# Patient Record
Sex: Male | Born: 1935 | Race: White | Hispanic: No | Smoking: Former smoker
Health system: Southern US, Community
[De-identification: ages and names within clinical notes are randomized; demographics above are authoritative.]

## PROBLEM LIST (undated history)

## (undated) DIAGNOSIS — I10 Essential (primary) hypertension: Secondary | ICD-10-CM

## (undated) DIAGNOSIS — I714 Abdominal aortic aneurysm, without rupture, unspecified: Secondary | ICD-10-CM

## (undated) DIAGNOSIS — D696 Thrombocytopenia, unspecified: Secondary | ICD-10-CM

## (undated) DIAGNOSIS — I4891 Unspecified atrial fibrillation: Secondary | ICD-10-CM

## (undated) DIAGNOSIS — M5136 Other intervertebral disc degeneration, lumbar region: Secondary | ICD-10-CM

## (undated) DIAGNOSIS — R609 Edema, unspecified: Secondary | ICD-10-CM

## (undated) DIAGNOSIS — Z8572 Personal history of non-Hodgkin lymphomas: Secondary | ICD-10-CM

## (undated) DIAGNOSIS — R739 Hyperglycemia, unspecified: Secondary | ICD-10-CM

## (undated) DIAGNOSIS — Z950 Presence of cardiac pacemaker: Secondary | ICD-10-CM

## (undated) DIAGNOSIS — R011 Cardiac murmur, unspecified: Secondary | ICD-10-CM

## (undated) DIAGNOSIS — D649 Anemia, unspecified: Secondary | ICD-10-CM

## (undated) DIAGNOSIS — Y95 Nosocomial condition: Secondary | ICD-10-CM

## (undated) DIAGNOSIS — Z9889 Other specified postprocedural states: Secondary | ICD-10-CM

## (undated) DIAGNOSIS — N3281 Overactive bladder: Secondary | ICD-10-CM

## (undated) DIAGNOSIS — E785 Hyperlipidemia, unspecified: Secondary | ICD-10-CM

## (undated) DIAGNOSIS — IMO0002 Reserved for concepts with insufficient information to code with codable children: Secondary | ICD-10-CM

## (undated) DIAGNOSIS — F329 Major depressive disorder, single episode, unspecified: Secondary | ICD-10-CM

## (undated) DIAGNOSIS — F32A Depression, unspecified: Secondary | ICD-10-CM

## (undated) DIAGNOSIS — H9190 Unspecified hearing loss, unspecified ear: Secondary | ICD-10-CM

## (undated) DIAGNOSIS — Z9289 Personal history of other medical treatment: Secondary | ICD-10-CM

## (undated) DIAGNOSIS — I441 Atrioventricular block, second degree: Secondary | ICD-10-CM

## (undated) DIAGNOSIS — M51369 Other intervertebral disc degeneration, lumbar region without mention of lumbar back pain or lower extremity pain: Secondary | ICD-10-CM

## (undated) DIAGNOSIS — Z87898 Personal history of other specified conditions: Secondary | ICD-10-CM

## (undated) DIAGNOSIS — R413 Other amnesia: Secondary | ICD-10-CM

## (undated) DIAGNOSIS — N183 Chronic kidney disease, stage 3 (moderate): Secondary | ICD-10-CM

## (undated) DIAGNOSIS — Z85038 Personal history of other malignant neoplasm of large intestine: Secondary | ICD-10-CM

## (undated) DIAGNOSIS — J189 Pneumonia, unspecified organism: Secondary | ICD-10-CM

## (undated) HISTORY — DX: Abdominal aortic aneurysm, without rupture: I71.4

## (undated) HISTORY — DX: Thrombocytopenia, unspecified: D69.6

## (undated) HISTORY — DX: Abdominal aortic aneurysm, without rupture, unspecified: I71.40

## (undated) HISTORY — PX: COLOSTOMY: SHX63

## (undated) HISTORY — DX: Personal history of non-Hodgkin lymphomas: Z85.72

## (undated) HISTORY — DX: Edema, unspecified: R60.9

## (undated) HISTORY — DX: Unspecified atrial fibrillation: I48.91

## (undated) HISTORY — DX: Nosocomial condition: Y95

## (undated) HISTORY — DX: Essential (primary) hypertension: I10

## (undated) HISTORY — DX: Other intervertebral disc degeneration, lumbar region: M51.36

## (undated) HISTORY — DX: Major depressive disorder, single episode, unspecified: F32.9

## (undated) HISTORY — DX: Personal history of other medical treatment: Z92.89

## (undated) HISTORY — DX: Hyperglycemia, unspecified: R73.9

## (undated) HISTORY — DX: Depression, unspecified: F32.A

## (undated) HISTORY — DX: Personal history of other specified conditions: Z87.898

## (undated) HISTORY — DX: Personal history of other malignant neoplasm of large intestine: Z85.038

## (undated) HISTORY — DX: Overactive bladder: N32.81

## (undated) HISTORY — DX: Other amnesia: R41.3

## (undated) HISTORY — DX: Cardiac murmur, unspecified: R01.1

## (undated) HISTORY — DX: Unspecified hearing loss, unspecified ear: H91.90

## (undated) HISTORY — DX: Hyperlipidemia, unspecified: E78.5

## (undated) HISTORY — DX: Anemia, unspecified: D64.9

## (undated) HISTORY — DX: Other intervertebral disc degeneration, lumbar region without mention of lumbar back pain or lower extremity pain: M51.369

## (undated) HISTORY — DX: Pneumonia, unspecified organism: J18.9

## (undated) HISTORY — PX: CATARACT EXTRACTION, BILATERAL: SHX1313

## (undated) HISTORY — DX: Presence of cardiac pacemaker: Z95.0

---

## 1999-06-27 ENCOUNTER — Encounter: Payer: Self-pay | Admitting: General Surgery

## 1999-06-27 ENCOUNTER — Emergency Department (HOSPITAL_COMMUNITY): Admission: EM | Admit: 1999-06-27 | Discharge: 1999-06-27 | Payer: Self-pay | Admitting: Emergency Medicine

## 1999-06-29 ENCOUNTER — Encounter: Admission: RE | Admit: 1999-06-29 | Discharge: 1999-06-29 | Payer: Self-pay | Admitting: Oncology

## 1999-06-29 ENCOUNTER — Encounter: Payer: Self-pay | Admitting: Oncology

## 1999-07-21 ENCOUNTER — Inpatient Hospital Stay (HOSPITAL_COMMUNITY): Admission: RE | Admit: 1999-07-21 | Discharge: 1999-07-30 | Payer: Self-pay | Admitting: General Surgery

## 1999-07-21 ENCOUNTER — Encounter (INDEPENDENT_AMBULATORY_CARE_PROVIDER_SITE_OTHER): Payer: Self-pay | Admitting: Specialist

## 1999-07-23 ENCOUNTER — Encounter: Payer: Self-pay | Admitting: General Surgery

## 1999-08-13 ENCOUNTER — Emergency Department (HOSPITAL_COMMUNITY): Admission: EM | Admit: 1999-08-13 | Discharge: 1999-08-13 | Payer: Self-pay | Admitting: Emergency Medicine

## 2001-10-23 ENCOUNTER — Ambulatory Visit (HOSPITAL_COMMUNITY): Admission: RE | Admit: 2001-10-23 | Discharge: 2001-10-23 | Payer: Self-pay | Admitting: Oncology

## 2001-10-23 ENCOUNTER — Encounter: Payer: Self-pay | Admitting: Oncology

## 2001-11-14 ENCOUNTER — Encounter: Payer: Self-pay | Admitting: Oncology

## 2001-11-14 ENCOUNTER — Ambulatory Visit (HOSPITAL_COMMUNITY): Admission: RE | Admit: 2001-11-14 | Discharge: 2001-11-14 | Payer: Self-pay | Admitting: Oncology

## 2001-11-14 ENCOUNTER — Encounter (INDEPENDENT_AMBULATORY_CARE_PROVIDER_SITE_OTHER): Payer: Self-pay

## 2001-11-14 ENCOUNTER — Encounter (INDEPENDENT_AMBULATORY_CARE_PROVIDER_SITE_OTHER): Payer: Self-pay | Admitting: Specialist

## 2001-12-11 ENCOUNTER — Encounter (INDEPENDENT_AMBULATORY_CARE_PROVIDER_SITE_OTHER): Payer: Self-pay

## 2001-12-11 ENCOUNTER — Ambulatory Visit (HOSPITAL_COMMUNITY): Admission: RE | Admit: 2001-12-11 | Discharge: 2001-12-11 | Payer: Self-pay | Admitting: Oncology

## 2001-12-20 ENCOUNTER — Encounter: Payer: Self-pay | Admitting: Oncology

## 2001-12-20 ENCOUNTER — Ambulatory Visit (HOSPITAL_COMMUNITY): Admission: RE | Admit: 2001-12-20 | Discharge: 2001-12-20 | Payer: Self-pay | Admitting: Oncology

## 2001-12-27 ENCOUNTER — Ambulatory Visit (HOSPITAL_COMMUNITY): Admission: RE | Admit: 2001-12-27 | Discharge: 2001-12-27 | Payer: Self-pay | Admitting: Oncology

## 2001-12-27 ENCOUNTER — Encounter: Payer: Self-pay | Admitting: Oncology

## 2002-01-25 ENCOUNTER — Inpatient Hospital Stay (HOSPITAL_COMMUNITY): Admission: EM | Admit: 2002-01-25 | Discharge: 2002-01-26 | Payer: Self-pay | Admitting: Emergency Medicine

## 2002-01-25 ENCOUNTER — Encounter: Payer: Self-pay | Admitting: Emergency Medicine

## 2002-02-09 ENCOUNTER — Inpatient Hospital Stay (HOSPITAL_COMMUNITY): Admission: EM | Admit: 2002-02-09 | Discharge: 2002-02-11 | Payer: Self-pay | Admitting: Emergency Medicine

## 2002-02-09 ENCOUNTER — Encounter: Payer: Self-pay | Admitting: Emergency Medicine

## 2002-02-14 ENCOUNTER — Encounter: Payer: Self-pay | Admitting: Oncology

## 2002-02-14 ENCOUNTER — Ambulatory Visit (HOSPITAL_COMMUNITY): Admission: RE | Admit: 2002-02-14 | Discharge: 2002-02-14 | Payer: Self-pay | Admitting: Oncology

## 2002-03-06 ENCOUNTER — Emergency Department (HOSPITAL_COMMUNITY): Admission: EM | Admit: 2002-03-06 | Discharge: 2002-03-06 | Payer: Self-pay | Admitting: Emergency Medicine

## 2002-03-28 ENCOUNTER — Emergency Department (HOSPITAL_COMMUNITY): Admission: EM | Admit: 2002-03-28 | Discharge: 2002-03-28 | Payer: Self-pay | Admitting: *Deleted

## 2002-03-28 ENCOUNTER — Encounter: Payer: Self-pay | Admitting: Emergency Medicine

## 2002-03-28 ENCOUNTER — Encounter: Payer: Self-pay | Admitting: *Deleted

## 2002-03-28 ENCOUNTER — Emergency Department (HOSPITAL_COMMUNITY): Admission: EM | Admit: 2002-03-28 | Discharge: 2002-03-28 | Payer: Self-pay | Admitting: Emergency Medicine

## 2002-07-17 ENCOUNTER — Encounter: Payer: Self-pay | Admitting: Oncology

## 2002-07-17 ENCOUNTER — Ambulatory Visit (HOSPITAL_COMMUNITY): Admission: RE | Admit: 2002-07-17 | Discharge: 2002-07-17 | Payer: Self-pay | Admitting: Oncology

## 2002-10-16 ENCOUNTER — Encounter: Payer: Self-pay | Admitting: Oncology

## 2002-10-16 ENCOUNTER — Ambulatory Visit (HOSPITAL_COMMUNITY): Admission: RE | Admit: 2002-10-16 | Discharge: 2002-10-16 | Payer: Self-pay | Admitting: Oncology

## 2003-03-07 ENCOUNTER — Other Ambulatory Visit: Admission: RE | Admit: 2003-03-07 | Discharge: 2003-03-07 | Payer: Self-pay | Admitting: Unknown Physician Specialty

## 2003-05-23 ENCOUNTER — Other Ambulatory Visit: Admission: RE | Admit: 2003-05-23 | Discharge: 2003-05-23 | Payer: Self-pay | Admitting: Unknown Physician Specialty

## 2003-06-06 ENCOUNTER — Encounter: Payer: Self-pay | Admitting: Oncology

## 2003-06-06 ENCOUNTER — Ambulatory Visit (HOSPITAL_COMMUNITY): Admission: RE | Admit: 2003-06-06 | Discharge: 2003-06-06 | Payer: Self-pay | Admitting: Oncology

## 2003-12-09 ENCOUNTER — Ambulatory Visit (HOSPITAL_COMMUNITY): Admission: RE | Admit: 2003-12-09 | Discharge: 2003-12-09 | Payer: Self-pay | Admitting: Oncology

## 2004-04-30 ENCOUNTER — Other Ambulatory Visit: Admission: RE | Admit: 2004-04-30 | Discharge: 2004-04-30 | Payer: Self-pay | Admitting: Dermatology

## 2004-07-19 ENCOUNTER — Ambulatory Visit: Payer: Self-pay | Admitting: Oncology

## 2004-09-24 ENCOUNTER — Other Ambulatory Visit: Admission: RE | Admit: 2004-09-24 | Discharge: 2004-09-24 | Payer: Self-pay | Admitting: Dermatology

## 2005-01-07 ENCOUNTER — Ambulatory Visit: Payer: Self-pay | Admitting: Oncology

## 2005-01-12 ENCOUNTER — Ambulatory Visit (HOSPITAL_COMMUNITY): Admission: RE | Admit: 2005-01-12 | Discharge: 2005-01-12 | Payer: Self-pay | Admitting: Oncology

## 2005-03-01 ENCOUNTER — Ambulatory Visit: Payer: Self-pay | Admitting: Internal Medicine

## 2005-03-10 ENCOUNTER — Ambulatory Visit: Payer: Self-pay | Admitting: Internal Medicine

## 2005-03-10 ENCOUNTER — Ambulatory Visit (HOSPITAL_COMMUNITY): Admission: RE | Admit: 2005-03-10 | Discharge: 2005-03-10 | Payer: Self-pay | Admitting: Internal Medicine

## 2005-06-14 ENCOUNTER — Emergency Department (HOSPITAL_COMMUNITY): Admission: EM | Admit: 2005-06-14 | Discharge: 2005-06-14 | Payer: Self-pay | Admitting: Emergency Medicine

## 2005-06-27 ENCOUNTER — Emergency Department (HOSPITAL_COMMUNITY): Admission: EM | Admit: 2005-06-27 | Discharge: 2005-06-27 | Payer: Self-pay | Admitting: Emergency Medicine

## 2005-07-19 ENCOUNTER — Ambulatory Visit: Payer: Self-pay | Admitting: Oncology

## 2005-07-24 ENCOUNTER — Emergency Department (HOSPITAL_COMMUNITY): Admission: EM | Admit: 2005-07-24 | Discharge: 2005-07-24 | Payer: Self-pay | Admitting: Emergency Medicine

## 2005-10-22 ENCOUNTER — Ambulatory Visit: Payer: Self-pay | Admitting: Oncology

## 2005-10-27 ENCOUNTER — Ambulatory Visit (HOSPITAL_COMMUNITY): Admission: RE | Admit: 2005-10-27 | Discharge: 2005-10-27 | Payer: Self-pay | Admitting: Oncology

## 2006-05-04 ENCOUNTER — Ambulatory Visit: Payer: Self-pay | Admitting: Oncology

## 2006-05-06 LAB — CBC WITH DIFFERENTIAL/PLATELET
Basophils Absolute: 0 10*3/uL (ref 0.0–0.1)
EOS%: 4.5 % (ref 0.0–7.0)
LYMPH%: 15.2 % (ref 14.0–48.0)
MCH: 31.6 pg (ref 28.0–33.4)
MONO%: 10 % (ref 0.0–13.0)
NEUT#: 3.8 10*3/uL (ref 1.5–6.5)
NEUT%: 69.6 % (ref 40.0–75.0)
WBC: 5.4 10*3/uL (ref 4.0–10.0)
lymph#: 0.8 10*3/uL — ABNORMAL LOW (ref 0.9–3.3)

## 2006-05-06 LAB — IGG, IGA, IGM
IgA: 306 mg/dL (ref 68–378)
IgM, Serum: 217 mg/dL (ref 60–263)

## 2006-05-06 LAB — COMPREHENSIVE METABOLIC PANEL
AST: 20 U/L (ref 0–37)
BUN: 23 mg/dL (ref 6–23)
Calcium: 9.4 mg/dL (ref 8.4–10.5)
Chloride: 106 mEq/L (ref 96–112)
Creatinine, Ser: 1.19 mg/dL (ref 0.40–1.50)
Total Bilirubin: 0.6 mg/dL (ref 0.3–1.2)

## 2006-05-13 ENCOUNTER — Ambulatory Visit (HOSPITAL_COMMUNITY): Admission: RE | Admit: 2006-05-13 | Discharge: 2006-05-13 | Payer: Self-pay | Admitting: Oncology

## 2006-06-09 ENCOUNTER — Ambulatory Visit (HOSPITAL_COMMUNITY)
Admission: RE | Admit: 2006-06-09 | Discharge: 2006-06-09 | Payer: Self-pay | Admitting: Physical Medicine and Rehabilitation

## 2006-06-13 ENCOUNTER — Ambulatory Visit (HOSPITAL_COMMUNITY)
Admission: RE | Admit: 2006-06-13 | Discharge: 2006-06-13 | Payer: Self-pay | Admitting: Physical Medicine and Rehabilitation

## 2006-07-22 ENCOUNTER — Ambulatory Visit: Payer: Self-pay | Admitting: Oncology

## 2006-07-25 ENCOUNTER — Emergency Department (HOSPITAL_COMMUNITY): Admission: EM | Admit: 2006-07-25 | Discharge: 2006-07-25 | Payer: Self-pay | Admitting: Emergency Medicine

## 2006-07-26 LAB — CBC WITH DIFFERENTIAL/PLATELET
BASO%: 0.1 % (ref 0.0–2.0)
Basophils Absolute: 0 10*3/uL (ref 0.0–0.1)
EOS%: 0 % (ref 0.0–7.0)
HCT: 35.3 % — ABNORMAL LOW (ref 38.7–49.9)
HGB: 12.1 g/dL — ABNORMAL LOW (ref 13.0–17.1)
LYMPH%: 4.8 % — ABNORMAL LOW (ref 14.0–48.0)
MCH: 31.7 pg (ref 28.0–33.4)
MCHC: 34.3 g/dL (ref 32.0–35.9)
MONO#: 0.7 10*3/uL (ref 0.1–0.9)
NEUT%: 90.2 % — ABNORMAL HIGH (ref 40.0–75.0)
Platelets: 229 10*3/uL (ref 145–400)
lymph#: 0.7 10*3/uL — ABNORMAL LOW (ref 0.9–3.3)

## 2006-07-28 LAB — BETA 2 MICROGLOBULIN, SERUM: Beta-2 Microglobulin: 2.09 mg/L — ABNORMAL HIGH (ref 1.01–1.73)

## 2006-07-28 LAB — COMPREHENSIVE METABOLIC PANEL
BUN: 40 mg/dL — ABNORMAL HIGH (ref 6–23)
CO2: 24 mEq/L (ref 19–32)
Calcium: 9.4 mg/dL (ref 8.4–10.5)
Chloride: 104 mEq/L (ref 96–112)
Creatinine, Ser: 1.39 mg/dL (ref 0.40–1.50)
Total Bilirubin: 0.4 mg/dL (ref 0.3–1.2)

## 2006-07-28 LAB — IMMUNOFIXATION ELECTROPHORESIS
IgA: 287 mg/dL (ref 68–378)
IgG (Immunoglobin G), Serum: 710 mg/dL (ref 694–1618)
Total Protein, Serum Electrophoresis: 6.6 g/dL (ref 6.0–8.3)

## 2006-08-16 DIAGNOSIS — Z9289 Personal history of other medical treatment: Secondary | ICD-10-CM

## 2006-08-16 DIAGNOSIS — Z87898 Personal history of other specified conditions: Secondary | ICD-10-CM

## 2006-08-16 HISTORY — DX: Personal history of other medical treatment: Z92.89

## 2006-08-16 HISTORY — DX: Personal history of other specified conditions: Z87.898

## 2006-09-19 ENCOUNTER — Ambulatory Visit (HOSPITAL_COMMUNITY): Admission: RE | Admit: 2006-09-19 | Discharge: 2006-09-19 | Payer: Self-pay | Admitting: Oncology

## 2006-09-30 ENCOUNTER — Emergency Department (HOSPITAL_COMMUNITY): Admission: EM | Admit: 2006-09-30 | Discharge: 2006-09-30 | Payer: Self-pay | Admitting: Emergency Medicine

## 2006-11-07 ENCOUNTER — Inpatient Hospital Stay (HOSPITAL_COMMUNITY): Admission: EM | Admit: 2006-11-07 | Discharge: 2006-11-10 | Payer: Self-pay | Admitting: Emergency Medicine

## 2006-12-06 ENCOUNTER — Observation Stay (HOSPITAL_COMMUNITY): Admission: EM | Admit: 2006-12-06 | Discharge: 2006-12-07 | Payer: Self-pay | Admitting: Emergency Medicine

## 2006-12-09 ENCOUNTER — Emergency Department (HOSPITAL_COMMUNITY): Admission: EM | Admit: 2006-12-09 | Discharge: 2006-12-09 | Payer: Self-pay | Admitting: Emergency Medicine

## 2007-01-19 ENCOUNTER — Ambulatory Visit: Payer: Self-pay | Admitting: Oncology

## 2007-01-24 ENCOUNTER — Ambulatory Visit: Payer: Self-pay | Admitting: Family Medicine

## 2007-01-24 DIAGNOSIS — H269 Unspecified cataract: Secondary | ICD-10-CM

## 2007-01-24 DIAGNOSIS — N318 Other neuromuscular dysfunction of bladder: Secondary | ICD-10-CM

## 2007-01-24 DIAGNOSIS — I10 Essential (primary) hypertension: Secondary | ICD-10-CM | POA: Insufficient documentation

## 2007-01-24 DIAGNOSIS — C189 Malignant neoplasm of colon, unspecified: Secondary | ICD-10-CM

## 2007-01-24 DIAGNOSIS — C4490 Unspecified malignant neoplasm of skin, unspecified: Secondary | ICD-10-CM | POA: Insufficient documentation

## 2007-01-24 DIAGNOSIS — M545 Low back pain: Secondary | ICD-10-CM | POA: Insufficient documentation

## 2007-01-24 LAB — CBC WITH DIFFERENTIAL/PLATELET
BASO%: 0.6 % (ref 0.0–2.0)
EOS%: 5.2 % (ref 0.0–7.0)
HGB: 11.6 g/dL — ABNORMAL LOW (ref 13.0–17.1)
MCH: 31.1 pg (ref 28.0–33.4)
MCHC: 35.5 g/dL (ref 32.0–35.9)
RDW: 15.2 % — ABNORMAL HIGH (ref 11.2–14.6)
WBC: 4.7 10*3/uL (ref 4.0–10.0)
lymph#: 1 10*3/uL (ref 0.9–3.3)

## 2007-01-25 LAB — COMPREHENSIVE METABOLIC PANEL
ALT: 11 U/L (ref 0–53)
AST: 12 U/L (ref 0–37)
Albumin: 3.9 g/dL (ref 3.5–5.2)
Calcium: 8.8 mg/dL (ref 8.4–10.5)
Chloride: 110 mEq/L (ref 96–112)
Potassium: 3.7 mEq/L (ref 3.5–5.3)
Total Protein: 6.4 g/dL (ref 6.0–8.3)

## 2007-01-25 LAB — BETA 2 MICROGLOBULIN, SERUM: Beta-2 Microglobulin: 2.62 mg/L — ABNORMAL HIGH (ref 1.01–1.73)

## 2007-02-14 ENCOUNTER — Encounter (INDEPENDENT_AMBULATORY_CARE_PROVIDER_SITE_OTHER): Payer: Self-pay | Admitting: Family Medicine

## 2007-02-16 ENCOUNTER — Encounter (INDEPENDENT_AMBULATORY_CARE_PROVIDER_SITE_OTHER): Payer: Self-pay | Admitting: Family Medicine

## 2007-02-21 ENCOUNTER — Telehealth (INDEPENDENT_AMBULATORY_CARE_PROVIDER_SITE_OTHER): Payer: Self-pay | Admitting: Family Medicine

## 2007-02-21 ENCOUNTER — Ambulatory Visit: Payer: Self-pay | Admitting: Family Medicine

## 2007-02-21 DIAGNOSIS — M5137 Other intervertebral disc degeneration, lumbosacral region: Secondary | ICD-10-CM

## 2007-02-27 ENCOUNTER — Ambulatory Visit (HOSPITAL_COMMUNITY): Admission: RE | Admit: 2007-02-27 | Discharge: 2007-02-27 | Payer: Self-pay | Admitting: Family Medicine

## 2007-02-27 ENCOUNTER — Encounter (INDEPENDENT_AMBULATORY_CARE_PROVIDER_SITE_OTHER): Payer: Self-pay | Admitting: Family Medicine

## 2007-02-28 ENCOUNTER — Encounter (INDEPENDENT_AMBULATORY_CARE_PROVIDER_SITE_OTHER): Payer: Self-pay | Admitting: Family Medicine

## 2007-02-28 ENCOUNTER — Telehealth (INDEPENDENT_AMBULATORY_CARE_PROVIDER_SITE_OTHER): Payer: Self-pay | Admitting: *Deleted

## 2007-03-01 ENCOUNTER — Telehealth (INDEPENDENT_AMBULATORY_CARE_PROVIDER_SITE_OTHER): Payer: Self-pay | Admitting: Family Medicine

## 2007-03-07 ENCOUNTER — Ambulatory Visit: Payer: Self-pay | Admitting: Family Medicine

## 2007-03-07 ENCOUNTER — Telehealth (INDEPENDENT_AMBULATORY_CARE_PROVIDER_SITE_OTHER): Payer: Self-pay | Admitting: *Deleted

## 2007-03-20 ENCOUNTER — Encounter (INDEPENDENT_AMBULATORY_CARE_PROVIDER_SITE_OTHER): Payer: Self-pay | Admitting: Family Medicine

## 2007-03-23 ENCOUNTER — Encounter (INDEPENDENT_AMBULATORY_CARE_PROVIDER_SITE_OTHER): Payer: Self-pay | Admitting: Family Medicine

## 2007-04-05 ENCOUNTER — Ambulatory Visit: Payer: Self-pay | Admitting: Family Medicine

## 2007-04-05 ENCOUNTER — Telehealth (INDEPENDENT_AMBULATORY_CARE_PROVIDER_SITE_OTHER): Payer: Self-pay | Admitting: *Deleted

## 2007-04-05 DIAGNOSIS — F4321 Adjustment disorder with depressed mood: Secondary | ICD-10-CM

## 2007-04-12 ENCOUNTER — Encounter (INDEPENDENT_AMBULATORY_CARE_PROVIDER_SITE_OTHER): Payer: Self-pay | Admitting: Family Medicine

## 2007-04-12 ENCOUNTER — Ambulatory Visit: Payer: Self-pay | Admitting: Internal Medicine

## 2007-04-21 ENCOUNTER — Ambulatory Visit: Payer: Self-pay | Admitting: Internal Medicine

## 2007-04-21 ENCOUNTER — Encounter (INDEPENDENT_AMBULATORY_CARE_PROVIDER_SITE_OTHER): Payer: Self-pay | Admitting: Family Medicine

## 2007-04-21 ENCOUNTER — Telehealth (INDEPENDENT_AMBULATORY_CARE_PROVIDER_SITE_OTHER): Payer: Self-pay | Admitting: Family Medicine

## 2007-05-25 ENCOUNTER — Ambulatory Visit: Payer: Self-pay | Admitting: Oncology

## 2007-05-29 ENCOUNTER — Encounter (INDEPENDENT_AMBULATORY_CARE_PROVIDER_SITE_OTHER): Payer: Self-pay | Admitting: Family Medicine

## 2007-05-29 LAB — CBC WITH DIFFERENTIAL/PLATELET
BASO%: 0.3 % (ref 0.0–2.0)
Basophils Absolute: 0 10*3/uL (ref 0.0–0.1)
HCT: 35.2 % — ABNORMAL LOW (ref 38.7–49.9)
HGB: 12.4 g/dL — ABNORMAL LOW (ref 13.0–17.1)
MONO#: 0.5 10*3/uL (ref 0.1–0.9)
NEUT%: 66.4 % (ref 40.0–75.0)
RDW: 14.6 % (ref 11.2–14.6)
WBC: 4.7 10*3/uL (ref 4.0–10.0)
lymph#: 0.8 10*3/uL — ABNORMAL LOW (ref 0.9–3.3)

## 2007-05-30 ENCOUNTER — Encounter (INDEPENDENT_AMBULATORY_CARE_PROVIDER_SITE_OTHER): Payer: Self-pay | Admitting: Family Medicine

## 2007-05-30 LAB — COMPREHENSIVE METABOLIC PANEL
ALT: 14 U/L (ref 0–53)
Albumin: 3.9 g/dL (ref 3.5–5.2)
BUN: 20 mg/dL (ref 6–23)
CO2: 25 mEq/L (ref 19–32)
Calcium: 9.2 mg/dL (ref 8.4–10.5)
Chloride: 107 mEq/L (ref 96–112)
Creatinine, Ser: 1.22 mg/dL (ref 0.40–1.50)
Potassium: 3.8 mEq/L (ref 3.5–5.3)

## 2007-05-30 LAB — BETA 2 MICROGLOBULIN, SERUM: Beta-2 Microglobulin: 2.19 mg/L — ABNORMAL HIGH (ref 1.01–1.73)

## 2007-05-30 LAB — LACTATE DEHYDROGENASE: LDH: 152 U/L (ref 94–250)

## 2007-05-31 ENCOUNTER — Ambulatory Visit (HOSPITAL_COMMUNITY): Admission: RE | Admit: 2007-05-31 | Discharge: 2007-05-31 | Payer: Self-pay | Admitting: Oncology

## 2007-06-05 ENCOUNTER — Encounter (INDEPENDENT_AMBULATORY_CARE_PROVIDER_SITE_OTHER): Payer: Self-pay | Admitting: Family Medicine

## 2007-07-05 ENCOUNTER — Ambulatory Visit: Payer: Self-pay | Admitting: Family Medicine

## 2007-09-25 ENCOUNTER — Ambulatory Visit (HOSPITAL_COMMUNITY): Admission: RE | Admit: 2007-09-25 | Discharge: 2007-09-25 | Payer: Self-pay | Admitting: Urology

## 2007-10-19 ENCOUNTER — Ambulatory Visit: Payer: Self-pay | Admitting: Family Medicine

## 2007-11-21 ENCOUNTER — Ambulatory Visit: Payer: Self-pay | Admitting: Family Medicine

## 2007-11-29 ENCOUNTER — Ambulatory Visit: Payer: Self-pay | Admitting: Oncology

## 2007-12-04 ENCOUNTER — Encounter (INDEPENDENT_AMBULATORY_CARE_PROVIDER_SITE_OTHER): Payer: Self-pay | Admitting: Family Medicine

## 2007-12-04 LAB — COMPREHENSIVE METABOLIC PANEL
AST: 20 U/L (ref 0–37)
Alkaline Phosphatase: 67 U/L (ref 39–117)
BUN: 23 mg/dL (ref 6–23)
Glucose, Bld: 97 mg/dL (ref 70–99)
Potassium: 3.7 mEq/L (ref 3.5–5.3)
Total Bilirubin: 0.5 mg/dL (ref 0.3–1.2)

## 2007-12-04 LAB — CBC WITH DIFFERENTIAL/PLATELET
Basophils Absolute: 0 10*3/uL (ref 0.0–0.1)
EOS%: 3.5 % (ref 0.0–7.0)
Eosinophils Absolute: 0.2 10*3/uL (ref 0.0–0.5)
HGB: 12.5 g/dL — ABNORMAL LOW (ref 13.0–17.1)
LYMPH%: 19.9 % (ref 14.0–48.0)
MCH: 32.8 pg (ref 28.0–33.4)
MCV: 92.1 fL (ref 81.6–98.0)
MONO%: 13.5 % — ABNORMAL HIGH (ref 0.0–13.0)
NEUT#: 3 10*3/uL (ref 1.5–6.5)
Platelets: 142 10*3/uL — ABNORMAL LOW (ref 145–400)
RBC: 3.81 10*6/uL — ABNORMAL LOW (ref 4.20–5.71)
RDW: 15.3 % — ABNORMAL HIGH (ref 11.2–14.6)

## 2007-12-11 ENCOUNTER — Emergency Department (HOSPITAL_COMMUNITY): Admission: EM | Admit: 2007-12-11 | Discharge: 2007-12-11 | Payer: Self-pay | Admitting: Emergency Medicine

## 2008-01-02 ENCOUNTER — Ambulatory Visit: Payer: Self-pay | Admitting: Family Medicine

## 2008-01-02 DIAGNOSIS — E785 Hyperlipidemia, unspecified: Secondary | ICD-10-CM | POA: Insufficient documentation

## 2008-01-03 ENCOUNTER — Encounter (INDEPENDENT_AMBULATORY_CARE_PROVIDER_SITE_OTHER): Payer: Self-pay | Admitting: Family Medicine

## 2008-02-20 ENCOUNTER — Encounter (INDEPENDENT_AMBULATORY_CARE_PROVIDER_SITE_OTHER): Payer: Self-pay | Admitting: Family Medicine

## 2008-02-22 ENCOUNTER — Ambulatory Visit: Payer: Self-pay | Admitting: Family Medicine

## 2008-02-22 DIAGNOSIS — N401 Enlarged prostate with lower urinary tract symptoms: Secondary | ICD-10-CM

## 2008-02-22 DIAGNOSIS — R413 Other amnesia: Secondary | ICD-10-CM

## 2008-02-22 DIAGNOSIS — D696 Thrombocytopenia, unspecified: Secondary | ICD-10-CM

## 2008-03-04 ENCOUNTER — Ambulatory Visit: Payer: Self-pay | Admitting: Family Medicine

## 2008-03-05 ENCOUNTER — Encounter (INDEPENDENT_AMBULATORY_CARE_PROVIDER_SITE_OTHER): Payer: Self-pay | Admitting: Family Medicine

## 2008-03-05 LAB — CONVERTED CEMR LAB
Basophils Absolute: 0 10*3/uL (ref 0.0–0.1)
Basophils Relative: 0 % (ref 0–1)
Eosinophils Absolute: 0.3 10*3/uL (ref 0.0–0.7)
Eosinophils Relative: 5 % (ref 0–5)
Hemoglobin: 11.5 g/dL — ABNORMAL LOW (ref 13.0–17.0)
MCHC: 33.3 g/dL (ref 30.0–36.0)
MCV: 95.6 fL (ref 78.0–100.0)
Monocytes Absolute: 0.4 10*3/uL (ref 0.1–1.0)
Monocytes Relative: 9 % (ref 3–12)
RBC: 3.61 M/uL — ABNORMAL LOW (ref 4.22–5.81)
RDW: 14.6 % (ref 11.5–15.5)

## 2008-03-18 ENCOUNTER — Ambulatory Visit: Payer: Self-pay | Admitting: Family Medicine

## 2008-03-18 ENCOUNTER — Ambulatory Visit (HOSPITAL_COMMUNITY): Admission: RE | Admit: 2008-03-18 | Discharge: 2008-03-18 | Payer: Self-pay | Admitting: Family Medicine

## 2008-03-19 LAB — CONVERTED CEMR LAB
Basophils Absolute: 0 10*3/uL (ref 0.0–0.1)
Basophils Relative: 1 % (ref 0–1)
Hemoglobin: 11.8 g/dL — ABNORMAL LOW (ref 13.0–17.0)
MCHC: 33.1 g/dL (ref 30.0–36.0)
Monocytes Absolute: 0.7 10*3/uL (ref 0.1–1.0)
Neutro Abs: 4.1 10*3/uL (ref 1.7–7.7)
Neutrophils Relative %: 66 % (ref 43–77)
Platelets: 149 10*3/uL — ABNORMAL LOW (ref 150–400)
RDW: 14.8 % (ref 11.5–15.5)
Sed Rate: 36 mm/hr — ABNORMAL HIGH (ref 0–16)

## 2008-03-20 ENCOUNTER — Ambulatory Visit: Payer: Self-pay | Admitting: Family Medicine

## 2008-03-26 ENCOUNTER — Emergency Department (HOSPITAL_COMMUNITY): Admission: EM | Admit: 2008-03-26 | Discharge: 2008-03-26 | Payer: Self-pay | Admitting: Emergency Medicine

## 2008-03-29 ENCOUNTER — Encounter (INDEPENDENT_AMBULATORY_CARE_PROVIDER_SITE_OTHER): Payer: Self-pay | Admitting: Family Medicine

## 2008-04-01 ENCOUNTER — Ambulatory Visit: Payer: Self-pay | Admitting: Family Medicine

## 2008-04-17 ENCOUNTER — Encounter (INDEPENDENT_AMBULATORY_CARE_PROVIDER_SITE_OTHER): Payer: Self-pay | Admitting: Family Medicine

## 2008-04-29 ENCOUNTER — Ambulatory Visit: Payer: Self-pay | Admitting: Family Medicine

## 2008-04-30 ENCOUNTER — Telehealth (INDEPENDENT_AMBULATORY_CARE_PROVIDER_SITE_OTHER): Payer: Self-pay | Admitting: *Deleted

## 2008-05-31 ENCOUNTER — Ambulatory Visit: Payer: Self-pay | Admitting: Oncology

## 2008-06-04 LAB — CBC WITH DIFFERENTIAL/PLATELET
BASO%: 0.3 % (ref 0.0–2.0)
EOS%: 4.2 % (ref 0.0–7.0)
MCH: 33.6 pg — ABNORMAL HIGH (ref 28.0–33.4)
MCHC: 34.9 g/dL (ref 32.0–35.9)
MCV: 96.2 fL (ref 81.6–98.0)
MONO%: 10.7 % (ref 0.0–13.0)
RBC: 3.68 10*6/uL — ABNORMAL LOW (ref 4.20–5.71)
RDW: 14.3 % (ref 11.2–14.6)
lymph#: 0.9 10*3/uL (ref 0.9–3.3)

## 2008-06-04 LAB — COMPREHENSIVE METABOLIC PANEL
ALT: 13 U/L (ref 0–53)
AST: 15 U/L (ref 0–37)
Albumin: 3.9 g/dL (ref 3.5–5.2)
Alkaline Phosphatase: 66 U/L (ref 39–117)
Calcium: 9.6 mg/dL (ref 8.4–10.5)
Chloride: 104 mEq/L (ref 96–112)
Potassium: 3.8 mEq/L (ref 3.5–5.3)

## 2008-06-06 ENCOUNTER — Ambulatory Visit (HOSPITAL_COMMUNITY): Admission: RE | Admit: 2008-06-06 | Discharge: 2008-06-06 | Payer: Self-pay | Admitting: Oncology

## 2008-06-10 ENCOUNTER — Ambulatory Visit (HOSPITAL_COMMUNITY): Admission: RE | Admit: 2008-06-10 | Discharge: 2008-06-10 | Payer: Self-pay | Admitting: Family Medicine

## 2008-06-10 ENCOUNTER — Ambulatory Visit: Payer: Self-pay | Admitting: Family Medicine

## 2008-06-11 ENCOUNTER — Telehealth (INDEPENDENT_AMBULATORY_CARE_PROVIDER_SITE_OTHER): Payer: Self-pay | Admitting: Family Medicine

## 2008-06-11 ENCOUNTER — Encounter (INDEPENDENT_AMBULATORY_CARE_PROVIDER_SITE_OTHER): Payer: Self-pay | Admitting: Family Medicine

## 2008-06-12 ENCOUNTER — Telehealth (INDEPENDENT_AMBULATORY_CARE_PROVIDER_SITE_OTHER): Payer: Self-pay | Admitting: *Deleted

## 2008-06-12 LAB — CONVERTED CEMR LAB
Basophils Relative: 1 % (ref 0–1)
Eosinophils Absolute: 0.3 10*3/uL (ref 0.0–0.7)
Lymphs Abs: 1.1 10*3/uL (ref 0.7–4.0)
MCV: 97.4 fL (ref 78.0–100.0)
Neutro Abs: 4 10*3/uL (ref 1.7–7.7)
Neutrophils Relative %: 66 % (ref 43–77)
Platelets: 130 10*3/uL — ABNORMAL LOW (ref 150–400)
WBC: 6 10*3/uL (ref 4.0–10.5)

## 2008-06-17 ENCOUNTER — Ambulatory Visit: Payer: Self-pay | Admitting: Family Medicine

## 2008-06-20 ENCOUNTER — Ambulatory Visit (HOSPITAL_COMMUNITY): Admission: RE | Admit: 2008-06-20 | Discharge: 2008-06-20 | Payer: Self-pay | Admitting: Family Medicine

## 2008-07-01 ENCOUNTER — Encounter (INDEPENDENT_AMBULATORY_CARE_PROVIDER_SITE_OTHER): Payer: Self-pay | Admitting: Family Medicine

## 2008-07-08 ENCOUNTER — Ambulatory Visit: Payer: Self-pay | Admitting: Family Medicine

## 2008-07-09 LAB — CONVERTED CEMR LAB
Albumin: 4.1 g/dL (ref 3.5–5.2)
BUN: 20 mg/dL (ref 6–23)
Calcium: 9.2 mg/dL (ref 8.4–10.5)
Chloride: 106 meq/L (ref 96–112)
Glucose, Bld: 150 mg/dL — ABNORMAL HIGH (ref 70–99)
Lymphs Abs: 1 10*3/uL (ref 0.7–4.0)
MCV: 96.6 fL (ref 78.0–100.0)
Monocytes Relative: 7 % (ref 3–12)
Neutro Abs: 4.1 10*3/uL (ref 1.7–7.7)
Neutrophils Relative %: 72 % (ref 43–77)
Potassium: 4 meq/L (ref 3.5–5.3)
RBC: 3.84 M/uL — ABNORMAL LOW (ref 4.22–5.81)
WBC: 5.7 10*3/uL (ref 4.0–10.5)

## 2008-07-15 ENCOUNTER — Ambulatory Visit (HOSPITAL_COMMUNITY): Admission: RE | Admit: 2008-07-15 | Discharge: 2008-07-15 | Payer: Self-pay | Admitting: Family Medicine

## 2008-07-30 ENCOUNTER — Ambulatory Visit: Payer: Self-pay | Admitting: Family Medicine

## 2008-07-30 DIAGNOSIS — R7309 Other abnormal glucose: Secondary | ICD-10-CM | POA: Insufficient documentation

## 2008-07-31 ENCOUNTER — Encounter (INDEPENDENT_AMBULATORY_CARE_PROVIDER_SITE_OTHER): Payer: Self-pay | Admitting: Family Medicine

## 2008-08-06 ENCOUNTER — Encounter (INDEPENDENT_AMBULATORY_CARE_PROVIDER_SITE_OTHER): Payer: Self-pay | Admitting: Family Medicine

## 2008-08-23 ENCOUNTER — Ambulatory Visit: Payer: Self-pay | Admitting: Oncology

## 2008-08-27 LAB — CBC WITH DIFFERENTIAL/PLATELET
BASO%: 0.1 % (ref 0.0–2.0)
Basophils Absolute: 0 10*3/uL (ref 0.0–0.1)
EOS%: 3.6 % (ref 0.0–7.0)
MCH: 33.5 pg — ABNORMAL HIGH (ref 28.0–33.4)
MCHC: 35.1 g/dL (ref 32.0–35.9)
MCV: 95.4 fL (ref 81.6–98.0)
MONO%: 13 % (ref 0.0–13.0)
RBC: 3.41 10*6/uL — ABNORMAL LOW (ref 4.20–5.71)
RDW: 13.7 % (ref 11.2–14.6)

## 2008-08-27 LAB — MORPHOLOGY

## 2008-09-10 ENCOUNTER — Ambulatory Visit: Payer: Self-pay | Admitting: Family Medicine

## 2008-10-03 ENCOUNTER — Encounter (INDEPENDENT_AMBULATORY_CARE_PROVIDER_SITE_OTHER): Payer: Self-pay | Admitting: Family Medicine

## 2008-10-07 ENCOUNTER — Ambulatory Visit: Payer: Self-pay | Admitting: Cardiology

## 2008-10-07 LAB — CONVERTED CEMR LAB
ALT: 13 units/L (ref 0–53)
AST: 18 units/L (ref 0–37)
Basophils Absolute: 0 10*3/uL (ref 0.0–0.1)
CO2: 25 meq/L (ref 19–32)
Cholesterol: 101 mg/dL (ref 0–200)
Eosinophils Relative: 5 % (ref 0–5)
LDL Cholesterol: 36 mg/dL (ref 0–99)
Lymphocytes Relative: 21 % (ref 12–46)
Neutro Abs: 3.5 10*3/uL (ref 1.7–7.7)
Platelets: 140 10*3/uL — ABNORMAL LOW (ref 150–400)
RDW: 14.5 % (ref 11.5–15.5)
Sodium: 143 meq/L (ref 135–145)
Total Bilirubin: 0.6 mg/dL (ref 0.3–1.2)
Total Protein: 6.2 g/dL (ref 6.0–8.3)
VLDL: 28 mg/dL (ref 0–40)

## 2008-10-09 ENCOUNTER — Encounter (INDEPENDENT_AMBULATORY_CARE_PROVIDER_SITE_OTHER): Payer: Self-pay | Admitting: Family Medicine

## 2008-10-09 ENCOUNTER — Telehealth (INDEPENDENT_AMBULATORY_CARE_PROVIDER_SITE_OTHER): Payer: Self-pay | Admitting: *Deleted

## 2008-10-15 ENCOUNTER — Ambulatory Visit: Payer: Self-pay | Admitting: Family Medicine

## 2008-10-15 LAB — CONVERTED CEMR LAB: Blood Glucose, Fingerstick: 124

## 2008-10-18 ENCOUNTER — Ambulatory Visit: Payer: Self-pay | Admitting: Oncology

## 2008-10-22 LAB — CBC WITH DIFFERENTIAL/PLATELET
Basophils Absolute: 0 10*3/uL (ref 0.0–0.1)
EOS%: 4.5 % (ref 0.0–7.0)
Eosinophils Absolute: 0.2 10*3/uL (ref 0.0–0.5)
HGB: 11.1 g/dL — ABNORMAL LOW (ref 13.0–17.1)
MONO#: 0.7 10*3/uL (ref 0.1–0.9)
NEUT#: 2.9 10*3/uL (ref 1.5–6.5)
RDW: 14 % (ref 11.0–14.6)
WBC: 4.8 10*3/uL (ref 4.0–10.3)
lymph#: 1 10*3/uL (ref 0.9–3.3)

## 2008-10-22 LAB — CHCC SMEAR

## 2008-10-22 LAB — MORPHOLOGY
PLT EST: ADEQUATE
RBC Comments: NORMAL

## 2008-10-28 ENCOUNTER — Encounter (INDEPENDENT_AMBULATORY_CARE_PROVIDER_SITE_OTHER): Payer: Self-pay | Admitting: Family Medicine

## 2008-10-28 ENCOUNTER — Ambulatory Visit: Payer: Self-pay | Admitting: Cardiology

## 2008-11-05 ENCOUNTER — Telehealth (INDEPENDENT_AMBULATORY_CARE_PROVIDER_SITE_OTHER): Payer: Self-pay | Admitting: *Deleted

## 2008-11-12 ENCOUNTER — Ambulatory Visit: Payer: Self-pay | Admitting: Family Medicine

## 2008-11-12 DIAGNOSIS — R609 Edema, unspecified: Secondary | ICD-10-CM

## 2008-12-24 ENCOUNTER — Ambulatory Visit: Payer: Self-pay | Admitting: Family Medicine

## 2008-12-25 ENCOUNTER — Encounter (INDEPENDENT_AMBULATORY_CARE_PROVIDER_SITE_OTHER): Payer: Self-pay | Admitting: Family Medicine

## 2008-12-25 ENCOUNTER — Telehealth (INDEPENDENT_AMBULATORY_CARE_PROVIDER_SITE_OTHER): Payer: Self-pay | Admitting: Family Medicine

## 2008-12-25 LAB — CONVERTED CEMR LAB
Glucose, Bld: 112 mg/dL — ABNORMAL HIGH (ref 70–99)
Potassium: 4.1 meq/L (ref 3.5–5.3)
Sodium: 141 meq/L (ref 135–145)

## 2009-01-21 LAB — CONVERTED CEMR LAB
CO2: 23 meq/L (ref 19–32)
Chloride: 107 meq/L (ref 96–112)
Glucose, Bld: 141 mg/dL — ABNORMAL HIGH (ref 70–99)
Potassium: 3.9 meq/L (ref 3.5–5.3)
Sodium: 144 meq/L (ref 135–145)

## 2009-03-03 ENCOUNTER — Encounter (INDEPENDENT_AMBULATORY_CARE_PROVIDER_SITE_OTHER): Payer: Self-pay | Admitting: Family Medicine

## 2009-03-24 ENCOUNTER — Ambulatory Visit: Payer: Self-pay | Admitting: Family Medicine

## 2009-03-25 ENCOUNTER — Encounter (INDEPENDENT_AMBULATORY_CARE_PROVIDER_SITE_OTHER): Payer: Self-pay | Admitting: *Deleted

## 2009-03-25 LAB — CONVERTED CEMR LAB
ALT: 13 units/L
Albumin: 4.1 g/dL
CO2: 26 meq/L
Calcium: 9.1 mg/dL
Chloride: 106 meq/L
Creatinine, Ser: 1.43 mg/dL
HDL: 48 mg/dL
LDL Cholesterol: 45 mg/dL
Potassium: 4.1 meq/L
Total Protein: 6.5 g/dL

## 2009-03-27 LAB — CONVERTED CEMR LAB
AST: 17 units/L (ref 0–37)
Alkaline Phosphatase: 75 units/L (ref 39–117)
BUN: 25 mg/dL — ABNORMAL HIGH (ref 6–23)
Basophils Absolute: 0 10*3/uL (ref 0.0–0.1)
Calcium: 9.1 mg/dL (ref 8.4–10.5)
Creatinine, Ser: 1.43 mg/dL (ref 0.40–1.50)
Eosinophils Absolute: 0.3 10*3/uL (ref 0.0–0.7)
Eosinophils Relative: 7 % — ABNORMAL HIGH (ref 0–5)
Glucose, Bld: 138 mg/dL — ABNORMAL HIGH (ref 70–99)
HCT: 35.8 % — ABNORMAL LOW (ref 39.0–52.0)
HDL: 48 mg/dL (ref >39)
MCHC: 33.2 g/dL (ref 30.0–36.0)
MCV: 93.7 fL (ref 78.0–100.0)
Monocytes Absolute: 0.6 10*3/uL (ref 0.1–1.0)
Platelets: 119 10*3/uL — ABNORMAL LOW (ref 150–400)
RDW: 15.3 % (ref 11.5–15.5)
Total CHOL/HDL Ratio: 2.6
Triglycerides: 170 mg/dL — ABNORMAL HIGH (ref <150)

## 2009-03-28 ENCOUNTER — Ambulatory Visit (HOSPITAL_COMMUNITY): Admission: RE | Admit: 2009-03-28 | Discharge: 2009-03-28 | Payer: Self-pay | Admitting: Family Medicine

## 2009-04-07 ENCOUNTER — Ambulatory Visit: Payer: Self-pay | Admitting: Family Medicine

## 2009-04-07 LAB — CONVERTED CEMR LAB: Glucose, Bld: 154 mg/dL

## 2009-04-08 ENCOUNTER — Telehealth (INDEPENDENT_AMBULATORY_CARE_PROVIDER_SITE_OTHER): Payer: Self-pay | Admitting: *Deleted

## 2009-04-28 ENCOUNTER — Ambulatory Visit (HOSPITAL_COMMUNITY): Admission: RE | Admit: 2009-04-28 | Discharge: 2009-04-28 | Payer: Self-pay | Admitting: Neurosurgery

## 2009-05-22 ENCOUNTER — Encounter (INDEPENDENT_AMBULATORY_CARE_PROVIDER_SITE_OTHER): Payer: Self-pay | Admitting: Family Medicine

## 2009-06-03 ENCOUNTER — Ambulatory Visit: Payer: Self-pay | Admitting: Oncology

## 2009-06-05 ENCOUNTER — Ambulatory Visit (HOSPITAL_COMMUNITY): Admission: RE | Admit: 2009-06-05 | Discharge: 2009-06-05 | Payer: Self-pay | Admitting: Oncology

## 2009-06-05 LAB — CBC WITH DIFFERENTIAL/PLATELET
Eosinophils Absolute: 0.3 10*3/uL (ref 0.0–0.5)
HCT: 36.1 % — ABNORMAL LOW (ref 38.4–49.9)
LYMPH%: 22.1 % (ref 14.0–49.0)
MCHC: 35 g/dL (ref 32.0–36.0)
MCV: 95.6 fL (ref 79.3–98.0)
MONO#: 0.5 10*3/uL (ref 0.1–0.9)
MONO%: 10.7 % (ref 0.0–14.0)
NEUT#: 3.1 10*3/uL (ref 1.5–6.5)
NEUT%: 61.2 % (ref 39.0–75.0)
Platelets: 138 10*3/uL — ABNORMAL LOW (ref 140–400)
WBC: 5.1 10*3/uL (ref 4.0–10.3)

## 2009-06-05 LAB — COMPREHENSIVE METABOLIC PANEL
CO2: 29 mEq/L (ref 19–32)
Creatinine, Ser: 1.17 mg/dL (ref 0.40–1.50)
Glucose, Bld: 105 mg/dL — ABNORMAL HIGH (ref 70–99)
Total Bilirubin: 0.9 mg/dL (ref 0.3–1.2)

## 2009-06-05 LAB — LACTATE DEHYDROGENASE: LDH: 163 U/L (ref 94–250)

## 2009-06-06 LAB — BETA 2 MICROGLOBULIN, SERUM: Beta-2 Microglobulin: 2.13 mg/L — ABNORMAL HIGH (ref 1.01–1.73)

## 2009-07-28 ENCOUNTER — Encounter (INDEPENDENT_AMBULATORY_CARE_PROVIDER_SITE_OTHER): Payer: Self-pay | Admitting: *Deleted

## 2010-06-09 ENCOUNTER — Ambulatory Visit: Payer: Self-pay | Admitting: Oncology

## 2010-06-11 LAB — CBC WITH DIFFERENTIAL/PLATELET
BASO%: 0.4 % (ref 0.0–2.0)
Eosinophils Absolute: 0.3 10*3/uL (ref 0.0–0.5)
LYMPH%: 16.6 % (ref 14.0–49.0)
MCHC: 34.6 g/dL (ref 32.0–36.0)
MONO#: 0.6 10*3/uL (ref 0.1–0.9)
NEUT#: 3.6 10*3/uL (ref 1.5–6.5)
Platelets: 158 10*3/uL (ref 140–400)
RBC: 3.59 10*6/uL — ABNORMAL LOW (ref 4.20–5.82)
RDW: 14.2 % (ref 11.0–14.6)
WBC: 5.4 10*3/uL (ref 4.0–10.3)
lymph#: 0.9 10*3/uL (ref 0.9–3.3)

## 2010-06-12 LAB — COMPREHENSIVE METABOLIC PANEL
ALT: 14 U/L (ref 0–53)
Albumin: 3.7 g/dL (ref 3.5–5.2)
CO2: 27 mEq/L (ref 19–32)
Chloride: 104 mEq/L (ref 96–112)
Glucose, Bld: 98 mg/dL (ref 70–99)
Potassium: 4 mEq/L (ref 3.5–5.3)
Sodium: 144 mEq/L (ref 135–145)
Total Bilirubin: 0.4 mg/dL (ref 0.3–1.2)
Total Protein: 6.2 g/dL (ref 6.0–8.3)

## 2010-06-12 LAB — BETA 2 MICROGLOBULIN, SERUM: Beta-2 Microglobulin: 2.63 mg/L — ABNORMAL HIGH (ref 1.01–1.73)

## 2010-06-12 LAB — HEMOGLOBIN A1C: Hgb A1c MFr Bld: 6.2 % — ABNORMAL HIGH (ref <5.7)

## 2010-08-08 ENCOUNTER — Emergency Department (HOSPITAL_COMMUNITY)
Admission: EM | Admit: 2010-08-08 | Discharge: 2010-08-08 | Payer: Self-pay | Source: Home / Self Care | Admitting: Emergency Medicine

## 2010-09-06 ENCOUNTER — Encounter: Payer: Self-pay | Admitting: Family Medicine

## 2010-09-06 ENCOUNTER — Encounter: Payer: Self-pay | Admitting: *Deleted

## 2010-09-06 ENCOUNTER — Encounter: Payer: Self-pay | Admitting: Oncology

## 2010-12-15 ENCOUNTER — Ambulatory Visit (INDEPENDENT_AMBULATORY_CARE_PROVIDER_SITE_OTHER): Payer: Medicare Other | Admitting: Medical

## 2010-12-15 VITALS — BP 112/70 | HR 64 | Ht 64.0 in | Wt 194.0 lb

## 2010-12-15 DIAGNOSIS — M549 Dorsalgia, unspecified: Secondary | ICD-10-CM

## 2010-12-15 DIAGNOSIS — H8110 Benign paroxysmal vertigo, unspecified ear: Secondary | ICD-10-CM

## 2010-12-15 DIAGNOSIS — G8929 Other chronic pain: Secondary | ICD-10-CM

## 2010-12-15 DIAGNOSIS — I10 Essential (primary) hypertension: Secondary | ICD-10-CM

## 2010-12-15 DIAGNOSIS — F329 Major depressive disorder, single episode, unspecified: Secondary | ICD-10-CM

## 2010-12-16 ENCOUNTER — Encounter: Payer: Self-pay | Admitting: Medical

## 2010-12-16 NOTE — Assessment & Plan Note (Signed)
Hx/o colorectal cancer

## 2010-12-21 ENCOUNTER — Encounter: Payer: Self-pay | Admitting: Medical

## 2010-12-21 DIAGNOSIS — M549 Dorsalgia, unspecified: Secondary | ICD-10-CM | POA: Insufficient documentation

## 2010-12-21 DIAGNOSIS — H8110 Benign paroxysmal vertigo, unspecified ear: Secondary | ICD-10-CM | POA: Insufficient documentation

## 2010-12-21 DIAGNOSIS — I10 Essential (primary) hypertension: Secondary | ICD-10-CM | POA: Insufficient documentation

## 2010-12-21 DIAGNOSIS — F329 Major depressive disorder, single episode, unspecified: Secondary | ICD-10-CM | POA: Insufficient documentation

## 2010-12-21 DIAGNOSIS — F32A Depression, unspecified: Secondary | ICD-10-CM | POA: Insufficient documentation

## 2010-12-21 MED ORDER — ESCITALOPRAM OXALATE 10 MG PO TABS: 20.0000 mg | ORAL_TABLET | Freq: Every day | ORAL | Status: DC

## 2010-12-21 MED ORDER — HYDROCODONE-APAP-DIETARY PROD 10-650 MG PO MISC: ORAL | Status: DC

## 2010-12-21 MED ORDER — AMLODIPINE BESYLATE-VALSARTAN 10-320 MG PO TABS: 1.0000 | ORAL_TABLET | Freq: Every day | ORAL | Status: DC

## 2010-12-21 MED ORDER — MECLIZINE HCL 25 MG PO TABS: ORAL_TABLET | ORAL | Status: DC

## 2010-12-21 NOTE — Patient Instructions (Signed)
Refilled medications today.  Lexapro was increased, otherwise no new medication changes.  RTC in 55mo for recheck and labs, fasting.

## 2010-12-21 NOTE — Progress Notes (Signed)
  Subjective:    Patient ID: [name] [name], male    DOB: 09/17/1921, 77 y.o.   MRN: [id number]  HPI [name] [name] is here to establish care.  He is here with his wife today, and is a former patient of mine from [facility].  Been doing ok overall.  Needs some medications refilled.  Regarding his chronic pain, not completely controlled but his current medication does help.  He does not want anything stronger.  He ran out of meclizine and his dizziness has recurred since being off the medication.  BP has been doing ok.  No other new c/o.    Review of Systems  Constitutional: Negative for fever and chills.  Respiratory: Negative for shortness of breath.   Cardiovascular: Negative for chest pain.  Gastrointestinal: Negative.   Genitourinary: Negative for dysuria and difficulty urinating.  Musculoskeletal: Positive for back pain. Negative for myalgias and arthralgias.  Neurological: Negative for weakness and numbness.  Psychiatric/Behavioral: Negative.        Objective:   Physical Exam  Constitutional: He appears well-developed and well-nourished. No distress.  Cardiovascular: Normal rate, regular rhythm and normal heart sounds.   No murmur heard. Pulmonary/Chest: Effort normal and breath sounds normal. He has no wheezes. He has no rales.  Abdominal: Soft. He exhibits no mass. There is no tenderness. There is no guarding.  Musculoskeletal: He exhibits no edema.       Lumbar spine with mild generalized tenderness, decreased ROM in general due to pain, no LE edema, extremities non tender  Skin: Skin is warm and dry.          Assessment & Plan:   1. Chronic back pain   2. Hypertension   3. Depression   4. Vertigo, benign paroxysmal    1)  Chronic back pain - continue same meds, try and do some daily stretching.  He has been maintained on current medication for over a year now.  I discussed increasing his pain medication to oxycodone for better control, but he would  prefer to stick with Lortab as he fears worse constipation or sedation. 2) HTN - c/t same meds. 3) Depression - controlled on current medication 4) Vertigo - c/t meclizine, avoid sudden standing from seated position.  Discussed fall prevention.  C/t using cane. 5) f/u in a month fasting for recheck and general labs.

## 2010-12-22 ENCOUNTER — Encounter: Payer: Self-pay | Admitting: Medical

## 2010-12-22 ENCOUNTER — Other Ambulatory Visit: Payer: Self-pay | Admitting: Medical

## 2010-12-22 ENCOUNTER — Other Ambulatory Visit (INDEPENDENT_AMBULATORY_CARE_PROVIDER_SITE_OTHER): Payer: PRIVATE HEALTH INSURANCE | Admitting: Medical

## 2010-12-22 DIAGNOSIS — Z79899 Other long term (current) drug therapy: Secondary | ICD-10-CM

## 2010-12-22 MED ORDER — SIMVASTATIN 20 MG PO TABS: 20.0000 mg | ORAL_TABLET | Freq: Every day | ORAL | Status: DC

## 2010-12-24 ENCOUNTER — Encounter: Payer: Self-pay | Admitting: Medical

## 2010-12-29 NOTE — Assessment & Plan Note (Signed)
[name], [name]               CHART#:  [id number]   DATE:  04/12/2007                       DOB:  Feb 26, 1922   PRIMARY CARE PHYSICIAN:  Dr. Erby Pian.   CHIEF COMPLAINT:  Dark stools with red particles.   HISTORY OF PRESENT ILLNESS:  [name] is an 77 year old Caucasian  male who is status post APR for colorectal carcinoma, now with  colostomy.  He tells me approximately 2 weeks ago he began noticing what  he thought was red flesh in his stools.  Since that time, his stools  have been dark.  He was Hemoccult negative through Dr. Claire Shown  office.  He is followed by Dr. Darnelle Catalan for his colon cancer.  He tells  me he has had a normal CEA within the last 3 months.  He also tells me  he has had an MRI, but I do not have this report.  His last EGD and  colonoscopy was by Dr. Jena Gauss on March 10, 2005.  He was found to have  residual distal colon mucosa, which was somewhat friable.  He had  pancolonic diverticula, and a 2 cm x 2 cm possible lipoma.  EGD showed  antral prepyloric erosions, and he was found to have peristomal  herniation on MRI.  He was seen by Dr. Abbey Chatters, and felt that this  was a stable problem, which had been documented on previous CT scan, and  no further intervention needed to be performed at that time.  He was  also found to be H. pylori negative.  He tells me he is taking a  significant amount of aspirin.  He takes 81 mg daily, with an additional  650 mg to 975 mg for his back pain a couple of days a week, alternating  with Naprosyn.  He denies any anorexia, and his weight has remained  stable.   PAST MEDICAL AND SURGICAL HISTORY:  1. Non-Hodgkin's lymphoma.  2. He is status post APR for colorectal carcinoma with colostomy.  3. He has history of degenerative joint disease and chronic low back      pain.  4. He has had cataract surgery.  5. He has overactive bladder.  6. He has AAA.  7. Hypercholesterolemia.  8. Colonoscopy and EGD as described in  HPI.  9. He has depression.  10.He has had history of skin cancer.   CURRENT MEDICATIONS:  1. Multivitamin daily.  2. Aspirin 325 mg 1 to 3 daily p.r.n.  3. Naprosyn 50 mg p.r.n.  4. Atenolol 25 mg daily.  5. Hydrochlorothiazide 12.5 mg daily.  6. Tarka 1-1/2 tablets daily.  7. Unknown cholesterol medication.   ALLERGIES:  FLEXERIL.   FAMILY HISTORY:  There is no known family history of colorectal  carcinoma or GI problems.   SOCIAL HISTORY:  [name] is widowed.  He is retired.  He denies  alcohol, tobacco, or drug use.   REVIEW OF SYSTEMS:  See HPI, otherwise negative.   PHYSICAL EXAMINATION:  VITAL SIGNS:  Weight 184 pounds.  Height 66  inches.  Temperature 98.1.  Blood pressure 142/82.  Pulse 60.  GENERAL:  [name] is an 77 year old Caucasian male who has colostomy  status post APR for colorectal carcinoma.  He has done well.  He is  followed by Dr. Darnelle Catalan in [place].  Approximately 2 weeks  ago, he  noticed red flesh in his colostomy bag, and since that time he has  noticed darker than usual stools.  ABDOMEN:  Positive bowel sounds.  He has a colostomy to the left abdomen  with stoma clear, clean, beefy red.  He has brownish mushy stool, which  was Hemoccult negative.   IMPRESSION:  [name] is on a significant amount of aspirin and  NSAIDS placing him at risk for gastrointestinal bleeding.  I suspect he  may have bled from small bowel given his symptoms, but really could have  bled from anywhere.  He tells me he is having normal hemoglobin through  Dr. Claire Shown office, and we will request these records.   PLAN:  1. We will request recent records, including MRI from Dr. Darnelle Catalan, as      well as recent CBC from Dr. Claire Shown office.  2. I have encouraged him to use only the 81 mg of aspirin daily.  For      his chronic low back pain, he can give a trial of Tylenol Arthritis      as directed.  If this does not resolve his pain, he is to consult       Dr. Erby Pian for a non-NSAID medication.  3. We will Hemoccult his stools x3.  It is reassuring that he has had      2 negative Hemoccults thus far.  4. If his symptoms persist, he may need further evaluation.   I would like to thank Dr. Erby Pian for allowing [place] to participate in the  care of [name].       Lorenza Burton, N.P.  Electronically Signed     R. Roetta Sessions, M.D.  Electronically Signed    KJ/MEDQ  D:  04/12/2007  T:  04/12/2007  Job:  [id number]   cc:   Valentino Hue. Magrinat, M.D.  Franchot Heidelberg, M.D.

## 2010-12-29 NOTE — Letter (Signed)
October 07, 2008    Franchot Heidelberg, MD  [address], [address]  [place], [place] [address]   RE:  [name], [name]  MRN:  [id number]  /  DOB:  Jan 27, 1922   Dear [name] [name]:   It was my pleasure evaluating [name] [name] in the office today in  consultation at your request for exertional dyspnea, hypertension, and  hyperlipidemia.  As you know, this nice gentleman was previously a  patient of Dr. Roque Lias but now requests a transfer to the [facility]  practice.  He was initially seen by her for control of hypertension,  which has been severe at times; however, she reported to him that he had  a leaky heart valve based upon an echocardiogram.  [name] [name]  principal concern is increasing fatigue over the past year and limiting  exertional dyspnea over a number of years.  He has no known coronary  artery disease but has been told of a minimal abdominal aortic aneurysm  suggesting the presence of peripheral vascular disease.  He remotely  underwent stress testing in a procedure that sounds like a pharmacologic  stress nuclear examination.   Other medical problems includes fasting hyperglycemia without  pharmacologic treatment for diabetes.  He has chronic low back pain,  which he also identifies as a significant problem that limits both his  activity and pleasure.  He was recently found to have mild anemia and  thrombocytopenia.  Hypertension has been well controlled.  Hyperlipidemia has not recently been reassessed based upon the  laboratory studies available to me.   More serious past medical problems include carcinoma of the rectum in  2000, for which an AP resection and colostomy were performed, and  lymphoma, which developed 2 years later and was treated with  chemotherapy.  He received no adjunctive therapy for his rectal cancer  and has not received radiation therapy.   CURRENT MEDICATIONS:  1. Atenolol 25 mg daily.  2. Tarka 1-1/2 tablets for a total of 360  mg of verapamil daily.  3. Flomax 1 tablet daily.  4. Lexapro 150 mg daily.  5. Aspirin 81 mg daily.  6. Multivitamin.  7. Simvastatin 40 mg daily.  8. He uses acetaminophen and hydrocodone p.r.n.   SOCIAL HISTORY:  Retired; married, and lives locally; no children;  cannot walk up a flight of stairs.   FAMILY HISTORY:  Father died due to myocardial infarction; one brother  died due to renal failure.   REVIEW OF SYSTEMS:  Notable for the need for corrective lenses for near  vision, prior cataract surgery bilaterally, hearing impairment with use  of a hearing aid, urinary frequency, and arthritic discomfort that is  most prominent in the lower back.  All other systems reviewed and are  negative.   PHYSICAL EXAMINATION:  GENERAL:  Pleasant gentleman who is obviously  hard of hearing and who is in no acute distress.  VITAL SIGNS:  Weight is 198.  Blood pressure 140/75, heart rate 60 and  regular, respirations 12 and unlabored.  HEENT:  Pupils are miotic; EOMs full; normal lids and conjunctivae;  normal oral mucosa.  NECK:  No jugular venous distention; normal carotid upstrokes without  bruits.  ENDOCRINE:  No thyromegaly.  HEMATOPOIETIC:  No adenopathy.  SKIN:  Pallor; no significant lesions.  CARDIAC:  Normal first and second heart sounds.  ABDOMEN:  Soft and nontender; no organomegaly.  EXTREMITIES:  Pitting pretibial edema 1+; distal pulses are present but  reduced.  NEUROLOGIC:  Symmetric  strength and tone; normal cranial nerves except  for decreased hearing acuity.   Recent laboratory includes fairly normal CBC with a slight anemia;  hemoglobin was 12.5 with a normal MCV.  Thrombocytopenia was also  present with a platelet count of 116,000.  Chemistry profile was normal.   IMPRESSION:  [name] [name] has longstanding exertional dyspnea that is  certainly partially related to physical deconditioning.  His anemia is  minimal and does not account for his symptoms.  He  apparently has had  stress testing and echocardiography in the past that did not suggest  significant cardiac problems - those records will be obtained.  He  reports remote pulmonary function tests that also were unremarkable.  He  has had no significant clinical lung problems.   We will also seek records from prior hospital admissions.  I have asked  him to stop atenolol in case that is contributing to his fatigue and  dyspnea.  He will monitor blood pressure at home.  I will plan to  reassess his symptoms in 3 weeks.  Thanks so much for sending this nice  gentleman to see me.    Sincerely,      Gerrit Friends. Dietrich Pates, MD, [facility]  Electronically Signed    RMR/MedQ  DD: 10/07/2008  DT: 10/08/2008  Job #: [id number]   CC:    Valentino Hue. Magrinat, M.D.

## 2010-12-29 NOTE — Letter (Signed)
October 28, 2008    Franchot Heidelberg, MD  [address]  [place], [place]  [address]   RE:  [name] [name], [name] [name]  MRN:  [id number]  /  DOB:  04-Mar-1922   Dear Remi Haggard:   Mr. Schaller returns to the office for continued assessment and treatment  of multiple issues, most notably exertional dyspnea, hypertension, a  very small abdominal aortic aneurysm, and insignificant cerebrovascular  disease.  Since his last visit, he has done fairly well.  He tried to  discontinue atenolol, but noted very high blood pressures and resumed  the medication.  The card he brings me with blood pressures has no  systolics above 155 and no diastolics above 80.  He has felt fairly good  over the past few weeks.  Since switching from Tarka to  Exforge, he  notes some mild sense of swelling and some weight gain.   CURRENT MEDICATIONS:  1. Flomax 1 tablet daily.  2. Lexapro 150 mg daily.  3. Aspirin 81 mg daily.  4. Simvastatin 40 mg daily.  5. Exforge 10/160 mg daily.  6. Atenolol 25 mg daily.   PHYSICAL EXAMINATION:  GENERAL:  On exam, pleasant gentleman in no acute  distress with some hearing impairment.  VITAL SIGNS:  The weight is 201, 3 pounds increased over the past month.  Blood pressure 115/60, heart rate 60 and regular, respirations 14 and  unlabored.  NECK:  No jugular venous distention; faint right carotid bruit.  LUNGS:  Clear.  CARDIAC:  Normal first and second heart sounds; modest systolic ejection  murmur.  ABDOMEN:  Soft and nontender; no organomegaly.  EXTREMITIES:  1/2+ edema on the left and 1+ on the right.   IMPRESSION:  Mr. Breton is doing generally well.  Blood pressure  controlled slightly suboptimal for diabetic.  We will change Exforge to  Exforge HCT with his next prescription 10/320/25 mg daily.  He will  continue to monitor blood pressure at home and return in 1 month for  check with the nurses.  I will see this nice gentleman again in 9  months.    Sincerely,      Gerrit Friends. Dietrich Pates, MD, [facility]  Electronically Signed    RMR/MedQ  DD: 10/28/2008  DT: 10/29/2008  Job #: [id number]

## 2011-01-01 NOTE — Op Note (Signed)
NAMELUCIO, [name]              ACCOUNT NO.:  [id number]   MEDICAL RECORD NO.:  [id number]          PATIENT TYPE:  AMB   LOCATION:  DAY                           FACILITY:  [facility]   PHYSICIAN:  [name] [name], M.D. DATE OF BIRTH:  September 29, 1921   DATE OF PROCEDURE:  03/10/2005  DATE OF DISCHARGE:                                 OPERATIVE REPORT   PROCEDURE:  Diagnostic esophagogastroduodenoscopy followed by colonoscopy.   INDICATIONS FOR PROCEDURE:  An 77 year old gentleman status post AVR for  colorectal carcinoma, intermittent lancinating pain around his colostomy  site. CT _____________ periumbilical and peristomal hernia. He has also had  some melena intermittently and takes aspirin and Naprosyn. EGD and  colonoscopy are now being done. This approach has been discussed with the  patient at length. Potential risks, benefits, and alternatives have been  reviewed and questions answered. He is agreeable. Please see documentation  in the medical record.   PROCEDURE NOTE:  O2 saturation, blood pressure, pulse, and respirations were  monitored throughout the entire procedure. Conscious sedation with Versed 3  mg IV and Demerol 25 mg IV in divided doses.   INSTRUMENT:  Olympus video chip system.   FINDINGS:  Examination of the tubular esophagus revealed no mucosal  abnormalities. EG junction was easily traversed.   Stomach:  Gastric cavity was empty and insufflated well with air. Thorough  examination of gastric mucosa including retroflexed view of the proximal  stomach and esophagogastric junction was undertaken. The patient had  multiple linear erosions in the antrum and several solitary erosions. No  frank ulcer or infiltrating process seen. Pylorus was patent and easily  traversed. Examination of the bulb and second portion revealed no  abnormalities.   THERAPEUTIC/DIAGNOSTIC MANEUVERS:  None.   The patient tolerated the procedure well and was prepared for colonoscopy.   Digital examination of the colostomy revealed no abnormalities, although it  was somewhat offset and difficult to palpate the upstream lumen although I  was able to eventually do this. It is notable that the patient coughed prior  to the procedure, and good soft ball sized bulge was noted with the  colostomy in the center of the bulge.   ENDOSCOPIC FINDINGS:  The distal colon mucosa was somewhat friable.  Otherwise appeared normal. The scope was easily advanced all the way to the  cecum, ileocecal valve, and appendiceal orifice. The patient had scattered  pancolonic diverticula. There was a 2 x 2 cm yellowish submucosal soft  nodularity at the area of the hepatitic flexure consistent with lipoma, not  manipulated. From the level of the cecum and ileocecal valve and appendiceal  orifice, scope was slowly and cautiously withdrawn. All previous mentioned  mucosal surfaces were again seen. No other abnormalities were observed. The  patient tolerated both procedures well and was reactive to endoscopy.   IMPRESSION:  Esophagogastroduodenoscopy:  Normal esophagus. Antral  prepyloric erosions as described above. Otherwise normal stomach. Normal D1  and D2.   Residual colonic mucosa well seen. Some friability of the very distal colon  mucosa just inside the colostomy. Pancolonic diverticula. Lipoma, hepatic  flexure. The remainder of the residual colon appeared normal.   I do suspect the patient's peristomal discomfort is related to peristomal  herniation.   The gastric erosions may be related to aspirin and Naprosyn although we need  to rule out Helicobacter pylori.   RECOMMENDATIONS:  1.  I do feel that [name] [name] ought to have a surgical opinion with regards      to his peristomal pain and the hernia that now has been documented to      the present.  2.  Check Helicobacter pylori serologies. If positive, will treat for      Helicobacter pylori. If Helicobacter pylori serologies are  negative,      will consider adding empiric acid suppression therapy concomitantly to      ongoing NSAID use.       RMR/MEDQ  D:  03/10/2005  T:  03/10/2005  Job:  [id number]   cc:   [name] [name], M.D.  [address]  [place]  [place] [address]   [name] [name], M.D.  [address] [facility]  [place]  [place] [address]  Fax: [phone]   [name] [name], M.D.  [address]  [place]  [place] [address]  Fax: [phone]

## 2011-01-01 NOTE — H&P (Signed)
[facility]  Patient:    [name] [name], [name] [name] Visit Number: [id number] MRN: [id number]          Service Type: MED Location: 2S [phone] Attending Physician:  Cathleen Corti Dictated by:   Rose Phi. Myna Hidalgo, M.D. Admit Date:  02/09/2002 Discharge Date: 02/11/2002                           History and Physical  REASON FOR ADMISSION: 1. Fever. 2. Neutropenia. 3. Non-Hodgkins lymphoma.  HISTORY OF PRESENT ILLNESS:  [name] [name] is a 77 year old white gentleman, followed by Dr. Darnelle Catalan.  He had a history of stage I rectal cancer back in 2000.  He has a colostomy.  Initially presented with a paracecal mass.  This was biopsied and found to be large cell non-Hodgkins lymphoma.  He has had two cycles of chemotherapy with Rituxan-CHOP.  His last chemotherapy was two weeks ago.  He now has a temperature of 101.5.  He has had a dry cough.  There is no sore throat.  He feels somewhat weak.  He has had no rash.  There is no headache.  He denies any type of arthralgias or myalgias.  His colostomy has been a little bit more active.  He subsequently is being admitted for evaluation.  Because of his age, I feel he does need to be admitted for at least overnight management of IV fluids and intravenous antibiotics.  PAST MEDICAL HISTORY:  Hypertension.  ALLERGIES:  None.  MEDICATIONS:  Atenolol and Dyazide.  SOCIAL HISTORY:  Negative for tobacco or alcohol use.  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  He has no occupational exposures.  FAMILY HISTORY:  Unremarkable.  REVIEW OF SYSTEMS:  As stated in the history of present illness.  PHYSICAL EXAMINATION:  GENERAL:  This is a fairly well-developed, well-nourished, white gentleman in no obvious distress.  He is alert and oriented x 3.  VITAL SIGNS:  Temperature 101.4, pulse is 82, respiratory rate of 16, blood pressure is 108/60.  HEAD AND NECK:  Somewhat dry oral mucosa.  No adenopathy is noted in  his neck. He has no mucitis in the oral cavity.  LUNGS:  With some decreased breath sounds at the bases.  CARDIAC:  Regular rate and rhythm with no murmurs, rubs, or bruits.  ABDOMEN:  Soft abdomen with a colostomy.  The colostomy site is intact.  The stoma is pink.  There is no hepatosplenomegaly.  EXTREMITIES:  Some dry skin.  He has some trace edema in his ankles.  NEUROLOGIC:  No focal neurological deficits.  SKIN:  No rashes.  LABORATORY STUDIES:  Pending.  IMPRESSION:  [name] is a 77 year old gentleman with non-Hodgkins lymphoma.  He has large cell lymphoma.  He is getting chemotherapy.  Now has a fever.  His white cells are likely low.  Subsequently, we will go ahead and admit him.  We will place him on IV antibiotics.  We will do blood and urine cultures. Dictated by:   Rose Phi. Myna Hidalgo, M.D. Attending Physician:  Cathleen Corti DD:  02/09/02 TD:  02/12/02 Job: [id number]

## 2011-01-01 NOTE — Consult Note (Signed)
NAMEGUAGE, EFFERSON              ACCOUNT NO.:  [id number]   MEDICAL RECORD NO.:  [id number]          PATIENT TYPE:  AMB   LOCATION:  DAY                           FACILITY:  [facility]   PHYSICIAN:  R. Roetta Sessions, M.D. DATE OF BIRTH:  17-Jul-1922   DATE OF CONSULTATION:  03/01/2005  DATE OF DISCHARGE:                                   CONSULTATION   REASON FOR CONSULTATION:  Pain around colostomy, history of rectal cancer.   HISTORY OF PRESENT ILLNESS:  Mr. Romas is a pleasant 77 year old gentleman  referred by the courtesy of Dr. Ruthann Cancer to further evaluate  pericolostomy pain.  Mr. Panetta is status post APR by Dr. Avel Peace  down in [place] back in 2000 for colorectal cancer T2 N0.   It sounds like he has done just fine until a few months ago when he started  having lancinating pain in the area of the colostomy not particularly  associated with stool output.  Has not had any nausea or vomiting.  He has  had some almost tarry stools on occasion, particularly for a month or so two  months ago.  He actually says the past several weeks the pain is almost  completely subsided.  He has not had any gross blood per rectum.   CAT scan of the abdomen and pelvis from Jan 12, 2005 demonstrated post  surgical changes, distal colectomy, descending colostomy.  There is a  peristomal hernia noted containing some small bowel but there was no  evidence of incarceration.  There was a small supraumbilical hernia as well  containing only fat.  There was no adenopathy or other significant findings.   Mr. Mondry other significant oncological history includes diffuse large  cell non-Hodgkin's lymphoma of the right lower abdomen treated with CVP and  Rituxan, completed in September of 2003.  Apparently, he remains in  remission.   Although Mr. Manna says he has had PET scans and CAT scans, he says he has  never had a follow-up colonoscopy since the original colonoscopy done by  Dr.  Elmyra Ricks in [place] in 2000 leading to the diagnosis of his rectal  carcinoma.  He does have occasional reflux symptoms.  He does take an  aspirin daily and Naprosyn for low back pain.  There is no history of peptic  ulcer disease.  He is a former smoker.  He rarely consumes alcohol.  His  appetite is described as being good.  He has not lost any weight.   PAST MEDICAL HISTORY:  Significant for colorectal cancer and lymphoma as  described above, history of hypertension, chronic low back pain.   PAST SURGICAL HISTORY:  APR with colostomy, radiation therapy, lymph node  biopsy, colonoscopy Dr. Cleotis Nipper 2000.   CURRENT MEDICATIONS:  1.  Multivitamin daily.  2.  ASA 81 mg daily.  3.  Naprosyn 500 mg p.r.n.  4.  Atenolol 50 mg daily.  5.  HCTZ one-half tablet daily.  6.  Tarka one and one-half tablet daily.   ALLERGIES:  FLEXERIL.   FAMILY HISTORY:  Father died at age 52 with  a stroke.  Mother died age 69.  Otherwise, no history of chronic GI or liver illness.   SOCIAL HISTORY:  Patient is married.  Has two step-children.  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  He was a three pack a day smoker  until 56 when he quit.  He occasionally consumes alcohol.   REVIEW OF SYSTEMS:  No odynophagia.  No dysphagia.  Occasional reflux  symptoms.  Otherwise, GI review of systems as outlined above.  He has not  had any recent dyspnea, chest pain on exertion, or fever.   PHYSICAL EXAMINATION:  GENERAL:  Pleasant 77 year old gentleman resting  comfortably accompanied by his wife, Opal.  VITAL SIGNS:  Weight 188, height 5 feet 6.5 inches, temperature 97.8, blood  pressure 152/76, pulse 62.  SKIN:  Warm and dry.  There is no jaundice or tenting stigmata of chronic  liver disease.  HEENT:  Conjunctivae are pink.  Oral cavity:  No lesions.  JVD is not  prominent.  CHEST:  Lungs are clear to  auscultation.  CARDIAC:  Regular rate and rhythm without murmurs, rubs, or gallops.  ABDOMEN:  Well healed laparotomy scar.  Colostomy present dressed with bag.  Brown stool in the bag.  I do not appreciate any hernia.  Digital  examination of the stoma reveals it to be appropriately snug.  There is no  suggestion of mass or stenosis.   Hemoglobin slightly low at 12.6, hematocrit 36.3, MCV 90.3.  Total bilirubin  0.6, AST/ALT 15 and 15.   IMPRESSION:  Mr. Thorn Demas is a delightful 76 year old gentleman with  history of APR for colorectal carcinoma T1 N0 status post resection 2000 who  has had some transient lancinating peristomal pain earlier in the year which  seems to have gotten much better over the past several weeks.  CT indicates  that he does have a peristomal hernia which did contain some small bowel.  He also had a small periumbilical hernia.   He does give a report of seeing stool that resembled melena.  This is an  interesting observation.  He does take aspirin and Naprosyn on a regular  basis.   I would be suspicious that the peristomal hernia may have much to do with  his recent abdominal discomfort.  I do not have the actual CT scan films for  review at this time.   It appears that Mr. Casteneda has not had any surveillance of his residual  colon via colonoscopy since the original procedure back in 2000.   This needs to be performed.   RECOMMENDATIONS:  I have gone ahead and offered Mr. Loyer a colonoscopy to  surveil his residual colon.  Potential risks, benefits, and alternatives  have been reviewed.  Will plan to do this in the near future at [facility].  I think it would be a good idea to go ahead and take a look at  his upper GI tract at the same time given the report of possible melena and  his poly NSAID use.   In addition, will be reviewing the CT of the abdomen which was recently done and will obtain Dr. Verne Grain original colonoscopy  report for review.  Will make further recommendations in the very near future.   I would like to thank Dr. Ruthann Cancer for allowing me to see this nice  gentleman today.  Further recommendations  to follow.       RMR/MEDQ  D:  03/01/2005  T:  03/01/2005  Job:  [id number]   cc:   Adolph Pollack, M.D.  [address]  [place]  [place] [address]   Valentino Hue. Magrinat, M.D.  [address] [facility]  [place]  [place] [address]  Fax: [phone]   Excell Seltzer. Annabell Howells, M.D.  [address]  [place]  [place] [address]  Fax: [phone]

## 2011-01-01 NOTE — Discharge Summary (Signed)
[name] [name], POWER              ACCOUNT NO.:  [id number]   MEDICAL RECORD NO.:  [id number]          PATIENT TYPE:  INP   LOCATION:  1620                         FACILITY:  [facility]   PHYSICIAN:  Bertram Millard. Dahlstedt, M.D.DATE OF BIRTH:  May 24, 1922   DATE OF ADMISSION:  11/07/2006  DATE OF DISCHARGE:  11/10/2006                               DISCHARGE SUMMARY   DISCHARGE DIAGNOSES:  1. Urinary tract infection.  2. Constipation.  3. History of recurrent urinary tract infections.  4. Colon cancer, status post colectomy.   PRINCIPAL PROCEDURES:  None.   BRIEF HISTORY:  This is an 77 year old male who was admitted on November 07, 2006, for fever, lower abdominal pain and history of UTIs.  He had a  fever of 104 in the ER where presented with left lower quadrant pain.  He had been constipated for a few days, although he had gas in his  colostomy bag.   Evaluation in the emergency room revealed no evidence of bowel  obstruction on abdominal x-ray.  He did have modest pyuria, however.  He  was admitted for further management.   HOSPITAL COURSE:  Mr. Stangelo was admitted to my service.  He is  followed by Dr. Annabell Howells for chronic UTIs.  Dr. Annabell Howells was out of town.  I  cultured his urine.  By the time of this dictation, the urine culture  results had not been finalized.  He was placed on IV Rocephin 500 mg  daily.  He was given a laxative which got his bowels working.  He had no  further abdominal pain.  Followup blood tests were not done as the  patient clinically improved during the hospital course.  He was eating a  regular diet.  He basically defervesced during his three-day  hospitalization.  On November 10, 2006, he was felt to have reached maximum  hospital benefit.  He was switched over to Levaquin 500 mg p.o. daily.  He was told to go on a regular diet.  He will follow up with Dr. Annabell Howells  in approximately one week.  He was given seven days of Levaquin.  He  will pick this up in the  office.      Bertram Millard. Dahlstedt, M.D.  Electronically Signed     SMD/MEDQ  D:  11/10/2006  T:  11/10/2006  Job:  [id number]

## 2011-01-01 NOTE — Consult Note (Signed)
[place]  Patient:    [name] [name]                        MRN: [id number] Proc. Date: 06/27/99 Adm. Date:  [id number] Attending:  Annamarie Dawley CC:         Sheppard Penton. Stacie Acres, M.D.             Adolph Pollack, M.D. (ASAP).                          Consultation Report  PHYSICIAN REQUESTING CONSULTATION:  Sheppard Penton. Stacie Acres, M.D.  REASON FOR CONSULTATION:  Rectal lesion and bleeding.  HISTORY OF PRESENT ILLNESS:  This is a 77 year old male with the onset of rectal bleeding, June 24, 1999.  He went to [facility] and was admitted at that time.  He subsequently was seen and underwent a colonoscopy which, per his report, demonstrated a rectal mass that was biopsied on June 26, 1999.  He was told by the surgeon that it grossly was consistent with a carcinoma and was scheduled to have an elective operation on Monday.  The operation was either going to be a lower anterior resection or abdominoperineal resection.  Apparently, he became unhappy with the type of care that was given by the ancillary staff there and left the hospital and traveled to the emergency department here to be seen.  He states he has not had any massive bleeding since the first night and has had intermittent  spotting.  He has not lost any weight.  He does not feel tired or fatigued.  PAST MEDICAL HISTORY: 1. Hypertension. 2. Nephrolithiasis.  PREVIOUS OPERATIONS:  Bilateral cataract extraction.  ALLERGIES:  None known.  MEDICATIONS:  Dyazide, Tenormin, aspirin.  SOCIAL HISTORY:  He quit smoking 20 years ago.  He occasionally has an alcoholic beverage.  FAMILY HISTORY:  His father died from a stroke.  There is no colon cancer in the family.  REVIEW OF SYSTEMS:  CARDIOVASCULAR:  No known coronary artery disease, rheumatic fever or murmur.  PULMONARY:  No asthma, TB, pneumonia or COPD.  GI:  No hepatitis, peptic ulcer disease or diverticulitis.   GU:  He says occasionally he has a weak stream.  ENDOCRINE:  No diabetes or thyroid disease.  HEMATOLOGIC:  No history f bleeding disorders.  No DVT or blood transfusion.  PHYSICAL EXAMINATION:  GENERAL:  He is a well-developed, well-nourished male in no acute distress.  VITAL SIGNS:  Temperature is normal.  Blood pressure 161/86, pulse 67.  SKIN:  Warm and dry without rash or jaundice.  HEENT:  Eyes:  Sclerae are clear.  Extraocular motions are intact.  NECK:  No scars, palpable masses or thyromegaly.  NODES:  No palpable cervical or supraclavicular nodes.  CARDIOVASCULAR:  Heart demonstrates a regular rate and rhythm without a murmur. No lower extremity edema.  RESPIRATORY:  Respirations are nonlabored.  Breath sounds are equal and clear.  ABDOMEN:  Soft, nontender, nondistended, without scars, masses or organomegaly. No herniae are present.  GU:  Testicles are descended and there are no penile lesions.  RECTAL:  There is an external swelling.  Normal sphincter tone.  There is a palpable soft mobile mass at the tip of the finger.  There is a small amount of  blood in the rectal vault.  MUSCULOSKELETAL:  Full range of motion.  IMPRESSION:  [age]-year-old male with rectal bleeding, found  to have a rectal neoplasm.  It was biopsied, with the results pending at this time.  This was all done up at [facility].  He is down here for a second evaluation.  He is not currently bleeding at this time and is stable.  His laboratory data is included and his hemoglobin is 13.8.  Liver function tests are normal.  INR 1.4. Electrolytes normal.  He also had a chest x-ray in the emergency department which shows no active disease.  PLAN:  He will be discharged from the emergency room.  He has an incomplete workup, so I think he needs a CT scan of his abdomen and pelvis and we will arrange for  this as an outpatient.  I need to speak with the endoscopist and he may  need completion colonoscopy.  At that time, we will schedule him for elective resection. I told him that if he had massive bleeding, that he needs to call.  We will arrange all of this for him on Monday morning.  His wife said she would get all the records and the name of the surgeon and fax them to [place] on Monday morning. DD:  06/27/99 TD:  06/28/99 Job: [id number]

## 2011-01-01 NOTE — Discharge Summary (Signed)
[facility]  Patient:    [name] [name], [name] [name] Visit Number: [id number] MRN: [id number]          Service Type: MED Location: 2S [phone] Attending Physician:  Cathleen Corti Dictated by:   Leighton Roach Truett Perna, M.D. Admit Date:  02/09/2002 Discharge Date: 02/11/2002   CC:         Valentino Hue. Magrinat, M.D.   Discharge Summary  CONDITION ON DISCHARGE:  Improved.  DISCHARGE DIAGNOSES: 1. Admission with fever following chemotherapy with no source for infection    identified during this hospital admission. 2. Anemia/thrombocytopenia secondary to chemotherapy. 3. Non-Hodgkins lymphoma, status post two cycles of cyclophosphamide,    doxorubicin, vincristine and prednisone/Rituxan chemotherapy. 4. History of rectal cancer. 5. History of benign prostatic hypertrophy.  CONSULTATIONS:  None.  PROCEDURES:  None.  HISTORY OF PRESENT ILLNESS:  The patient is a 77 year old with non-Hodgkins lymphoma.  He presented on June 27, with a fever to 101.5 degrees approximately two weeks following a second cycle of CHOP/Rituxan chemotherapy. He was evaluated by Dr. Myna Hidalgo and complained of generalized "weakness."  He was admitted for further evaluation.  HOSPITAL COURSE:  He was placed on broad spectrum intravenous antibiotic support with cefepime and cultures of the blood and urine were obtained.  The cultures remained negative throughout this hospital admission and a chest x-ray showed no acute changes.  He had a maximum temperature of 101 degrees during this admission and he remained clinically stable.  A followup CBC on June 28, found the hemoglobin at 10.5 with a platelet count of 74,000 and a white count of 7.9 with 6400 granulocytes.  On the morning of June 29, he appeared stable for discharge.  DISCHARGE MEDICATIONS: 1. Tenormin 50 mg q.d. 2. Dyazide 25 mg q.d. 3. Cipro 500 mg b.i.d. 4. Flomax 0.4 mg q.d.  SPECIAL INSTRUCTIONS:  He is to call for a  fever greater than 101 degrees or for bleeding.  FOLLOWUP:  Follow up care will be with Dr. Darnelle Catalan as scheduled.  We will arrange for a lab visit during the week of June 30. Dictated by:   Leighton Roach Truett Perna, M.D. Attending Physician:  Cathleen Corti DD:  02/11/02 TD:  02/12/02 Job: [id number]

## 2011-01-01 NOTE — H&P (Signed)
[name] [name], [name] [name]              ACCOUNT NO.:  [id number]   MEDICAL RECORD NO.:  [id number]          PATIENT TYPE:  OBV   LOCATION:  A314                          FACILITY:  [facility]   PHYSICIAN:  Dalia Heading, M.D.  DATE OF BIRTH:  27-Aug-1921   DATE OF ADMISSION:  12/06/2006  DATE OF DISCHARGE:  LH                              HISTORY & PHYSICAL   CHIEF COMPLAINT:  Nausea and vomiting.   HISTORY OF PRESENT ILLNESS:  The patient is an 77 year old white male  who presented with a 24-hour history of worsening lower abdominal pain,  nausea, vomiting.  He states this started soon after eating some food  which included meat.  He thinks he may have had some mild food  poisoning.  He feels better this evening and his nausea and vomiting  have resolved.   PAST MEDICAL HISTORY:  1. Rectal cancer.  2. Hodgkin lymphoma.  3. Hypertension.   PAST SURGICAL HISTORY:  Abdominal perineal resection in 2000.   CURRENT MEDICATIONS:  1. Tarka 4/240 mg p.o. daily.  2. Hydrochlorothiazide 25 mg p.o. daily.  3. Atenolol 50 mg p.o. daily.  4. Tylenol #3 as needed for pain.  5. Baby aspirin 1 tablet p.o. daily.   ALLERGIES:  No known drug allergies.   REVIEW OF SYSTEMS:  Noncontributory.   PHYSICAL EXAMINATION:  GENERAL:  The patient is a well-developed and  well-nourished white male in no acute distress.  VITAL SIGNS: He is afebrile and vital signs are stable.  LUNGS:  Clear to auscultation with equal breath sounds bilaterally.  HEART:  Regular rate and rhythm without S3, S4, or murmurs.  ABDOMEN:  Soft, nontender, nondistended.  Positive bowel sounds are  heard.  No masses are noted.  A left lower quadrant colostomy is  present.  He does have a history of a parastomal hernia, though there is  no hernia contents that are incarcerated.  There is stool and gas within  the colostomy bag.   White blood cell count 8.8, hematocrit 41, platelet count 171.  Met-7 is  remarkable for potassium of  3.3, chloride 35, carbon dioxide 32, BUN 24,  creatinine 1.3.  Liver tests are within normal limits.  Amylase and  lipase are within normal limits.  Urinalysis is unremarkable.  Acute  abdominal series reveals by a basilar mild atelectasis.  A KUB reveals a  possible partial small-bowel obstruction with some dilated loops of  small bowel, though there is stool and air within the colon.   IMPRESSION:  1. Nausea and vomiting, question mechanical partial small-bowel      obstruction versus gastroenteritis.  2. History of rectal carcinoma.  3. Three of Hodgkin lymphoma.  4. Hypokalemia.   PLAN:  The patient will be brought into the hospital for observation.  I  will follow the patient overnight.  Should his condition warrant, a CT  scan of the abdomen and pelvis will be performed with oral contrast.  He  had a previous CT scan of the abdomen and pelvis 2 months ago for follow-  up of this rectal cancer and Hodgkin lymphoma.  Those results were  reviewed.  His potassium will be supplemented.      Dalia Heading, M.D.  Electronically Signed     MAJ/MEDQ  D:  12/06/2006  T:  12/07/2006  Job:  [id number]   cc:   Dr. Francia Greaves, [place]

## 2011-01-01 NOTE — H&P (Signed)
[place]  Patient:    [name] [name]                        MRN: [id number] Adm. Date:  [id number] Attending:  Arlis Porta                         History and Physical  REASON FOR ADMISSION:  Rectal cancer, here for resection.  HISTORY OF PRESENT ILLNESS:  This is a 77 year old male who had some rectal bleeding and went to [facility] and was admitted.  He underwent colonoscopy which demonstrated a rectal mass.  It was biopsied on June 26, 1999, and was consistent with adenocarcinoma.  He was scheduled to have an elective operation on Monday, being either a low anterior resection or abdominal perineal resection. He became unhappy with the type of ancillary care in the hospital and left AMA, and then came to the emergency department at [facility] where I saw him.  He had the elective operation set up at that time, however, he developed torticollis of the neck and had that treated, and now he is admitted for his resection.  PAST MEDICAL HISTORY: 1. Hypertension. 2. Nephrolithiasis.  PAST SURGICAL HISTORY:  Bilateral cataract extraction.  ALLERGIES:  No known drug allergies.  MEDICATIONS: 1. Diazide. 2. Tenormin. 3. Aspirin.  SOCIAL HISTORY:  He used to smoke, but quit 20 years ago.  He occasionally has n alcoholic beverage.  FAMILY HISTORY:  His father died from a stroke.  There is no colon cancer in the family.  REVIEW OF SYSTEMS: CARDIAC:  No known coronary artery disease, rheumatic fever, or murmur. PULMONARY:  No asthma, TB, pneumonia, or chronic obstructive pulmonary disease. GASTROINTESTINAL:  No hepatitis, peptic ulcer disease, diverticulitis. GU:  He occasionally has a weak stream. ENDOCRINE:  No diabetes or thyroid disease. HEMATOLOGIC:  No history of bleeding disorders, no DVT, or blood transfusion.  PHYSICAL EXAMINATION: GENERAL:  Well-developed, well-nourished male in no acute  distress. SKIN:  Warm and dry without rash or jaundice. HEENT:  Normocephalic, atraumatic.  Extraocular movements intact.  Sclera clear. NECK:  No scars, palpable masses, or thyromegaly nodes.  No palpable cervical or supraclavicular nodes. CARDIOVASCULAR:  Heart demonstrates a regular rate and rhythm without a murmur. No lower extremity edema. RESPIRATORY:  Respirations are not labored.  Breath sounds equal and clear. ABDOMEN:  Soft, nontender, nondistended.  No scars, masses, or organomegaly. No hernia present. GU:  Testicles are descended and there are no penile lesions. RECTAL:  There are some external swellings.  Normal sphincter tone.  There is a  palpable soft mobile mass at the very tip of the finger which is small.  IMPRESSION:  The patient is a 77 year old male with rectal bleeding, found to have rectal cancer.  Chest x-ray was negative for active disease, and CT scan was negative for metastasis.  PLAN:  Low anterior resection, possible APR.  I did explain the procedure and the risks, including, but not limited to, bleeding, infection, possible impotence or bladder dysfunction, and possible risk of general anesthetic.  He and his wife seemed to understand it, and agreed to proceed. DD:  07/21/99 TD:  07/22/99 Job: [id number]

## 2011-01-01 NOTE — H&P (Signed)
[name] [name], [name] [name]              ACCOUNT NO.:  [id number]   MEDICAL RECORD NO.:  [id number]          PATIENT TYPE:  INP   LOCATION:  1620                         FACILITY:  [facility]   PHYSICIAN:  Bertram Millard. Dahlstedt, M.D.DATE OF BIRTH:  1921-10-10   DATE OF ADMISSION:  11/07/2006  DATE OF DISCHARGE:                              HISTORY & PHYSICAL   CHIEF COMPLAINT:  Fever.   BRIEF HISTORY:  This 77 year old gentleman, followed by Dr. Annabell [name] for  chronic UTIs, presented to the emergency room on 11/07/2006 with left  lower quadrant pain.  He had no bowel movement for about 4 days.  He had  been having flatus, however.  He has a history of colectomy for  colorectal cancer 2000.  He is followed by Dr. 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  He was last seen by Dr.  Annabell [name] in February.  At that time cystoscopy showed no significant  bladder abnormalities.  His culture was negative and it was recommended  that he continue on Macrodantin.  The patient had not continued that  medicine, however.  A day or two before presenting to the emergency room  he began feeling ill.  He initially presented to the emergency room  where his abdominal series was negative.  He was found to have a  temperature of 104 and a urinalysis that was infected.  Culture was  sent.   I was contacted for consultation.  The patient was admitted  for  treatment of a probable pyelonephritis.   PAST MEDICAL HISTORY:  Significant for colorectal cancer, status post a  previously mentioned colostomy.  He has a history of lymphoma, followed  conservatively.  He is hypertensive.   MEDICATIONS:  Include Tylenol #3 as needed, baby aspirin, Atakr,  atenolol and HCTZ.   ALLERGIES:  He denies any drug allergies.   SOCIAL HISTORY:  He is widowed, retired.  He has a significant other  named [name] [name].  He does not use tobacco.   FAMILY HISTORY:  Significant for kidney failure and hypertension.   REVIEW  OF SYSTEMS:  Other than the fever and his abdominal pain are  essentially negative.   PHYSICAL EXAMINATION:  GENERAL:  Pleasant elderly male.  VITALS:  Blood pressure 153/76, pulse 70, respiratory 20, temperature  104 in the emergency room.  NECK:  Supple without thyromegaly or adenopathy.  CHEST:  Clear.  HEART: Normal rate and rhythm.  ABDOMEN:  Protuberant, soft, minimal left lower quadrant tenderness.  Gas in his colostomy bag.  No CVA tenderness or flank mass.  He had  right testicular atrophy.  GENITAL EXAM:  Otherwise normal.  No peripheral edema.   LABORATORIES ON ADMISSION:  His urinalysis revealed 11-20 white cells,  moderate leukocyte esterase, hyaline cast.  White count 6200, hematocrit  37.3  with a left shift.  Glucose 156, BUN 31, creatinine 2.0.  Estimated GFR was 38.   I reviewed the patient's abdominal series which was essentially  negative.   IMPRESSION:  1. Probable UTI.  2. Constipation - doubt small-bowel obstruction.  3. History of colon cancer,  status post colectomy.   PLAN:  1. Admit for IV antibiotics - will start on Rocephin.  2. Hydrate  3. Laxatives.      Bertram Millard. Dahlstedt, M.D.  Electronically Signed     SMD/MEDQ  D:  11/10/2006  T:  11/10/2006  Job:  [id number]   cc:   [name] [name], M.D.  Fax: [phone]

## 2011-01-18 ENCOUNTER — Encounter: Payer: Self-pay | Admitting: Medical

## 2011-01-18 ENCOUNTER — Ambulatory Visit (INDEPENDENT_AMBULATORY_CARE_PROVIDER_SITE_OTHER): Payer: Medicare Other | Admitting: Medical

## 2011-01-18 VITALS — BP 110/68 | HR 64 | Temp 97.8°F | Ht 64.0 in | Wt 191.0 lb

## 2011-01-18 DIAGNOSIS — Z87898 Personal history of other specified conditions: Secondary | ICD-10-CM

## 2011-01-18 DIAGNOSIS — E785 Hyperlipidemia, unspecified: Secondary | ICD-10-CM

## 2011-01-18 DIAGNOSIS — G8929 Other chronic pain: Secondary | ICD-10-CM

## 2011-01-18 DIAGNOSIS — M549 Dorsalgia, unspecified: Secondary | ICD-10-CM

## 2011-01-18 DIAGNOSIS — N3281 Overactive bladder: Secondary | ICD-10-CM

## 2011-01-18 DIAGNOSIS — I10 Essential (primary) hypertension: Secondary | ICD-10-CM

## 2011-01-18 DIAGNOSIS — D492 Neoplasm of unspecified behavior of bone, soft tissue, and skin: Secondary | ICD-10-CM

## 2011-01-18 DIAGNOSIS — N318 Other neuromuscular dysfunction of bladder: Secondary | ICD-10-CM

## 2011-01-18 DIAGNOSIS — Z8579 Personal history of other malignant neoplasms of lymphoid, hematopoietic and related tissues: Secondary | ICD-10-CM

## 2011-01-18 DIAGNOSIS — I714 Abdominal aortic aneurysm, without rupture: Secondary | ICD-10-CM

## 2011-01-18 LAB — CK: Total CK: 76 U/L (ref 7–232)

## 2011-01-18 LAB — CBC WITH DIFFERENTIAL/PLATELET
Basophils Absolute: 0 10*3/uL (ref 0.0–0.1)
Basophils Relative: 1 % (ref 0–1)
Eosinophils Absolute: 0.4 10*3/uL (ref 0.0–0.7)
Eosinophils Relative: 7 % — ABNORMAL HIGH (ref 0–5)
HCT: 35.8 % — ABNORMAL LOW (ref 39.0–52.0)
MCHC: 33.2 g/dL (ref 30.0–36.0)
MCV: 92.3 fL (ref 78.0–100.0)
Monocytes Absolute: 0.7 10*3/uL (ref 0.1–1.0)
Platelets: 135 10*3/uL — ABNORMAL LOW (ref 150–400)
RDW: 15.4 % (ref 11.5–15.5)

## 2011-01-18 LAB — COMPREHENSIVE METABOLIC PANEL
ALT: 11 U/L (ref 0–53)
AST: 16 U/L (ref 0–37)
CO2: 27 mEq/L (ref 19–32)
Chloride: 108 mEq/L (ref 96–112)
Creat: 1.19 mg/dL (ref 0.50–1.35)
Sodium: 143 mEq/L (ref 135–145)
Total Bilirubin: 0.6 mg/dL (ref 0.3–1.2)
Total Protein: 6.5 g/dL (ref 6.0–8.3)

## 2011-01-18 LAB — LIPID PANEL: LDL Cholesterol: 39 mg/dL (ref 0–99)

## 2011-01-18 NOTE — Progress Notes (Signed)
Subjective:   HPI  [name] [name] is a 77 y.o. male who presents for general recheck and fasting labs. He continues to have flareups of his back pain, he is using his pain medication but not scheduled. Denies recent injury or trauma, no bowel or bladder changes, no numbness or tingling, and in the past has declined stronger medication. Last imaging was through [facility] within the last year or 2.  He continues to have worsening hearing, is wearing his hearing aids regularly, and his hearing aids have been adjusted within the last year.  He has some skin changes of his left arm, and an area of his right ear that bleeds, and will not heal. He hasn't seen his dermatologist in a while. He is not sure of his followup appointments regarding hematology, his abdominal aneurysm, or his urologist. He is alone today in the room. Otherwise no new concerns.   The following portions of the patient's history were reviewed and updated as appropriate: allergies, current medications, past family history, past medical history, past social history, past surgical history and problem list.  Past Medical History  Diagnosis Date  . Depression   . Aneurysm of abdominal aorta   . History of colon cancer     hospitalization 1999  . Degenerative disc disease, lumbar   . History of non-Hodgkin's lymphoma     hospitalization 2002  . Cataract   . Overactive bladder   . Heart murmur   . Hard of hearing   . Hypertension   . Hyperlipidemia   . Anemia   . Thrombocytopenia   . Chronic renal disease   . Memory loss   . Hyperglycemia   . Edema     Review of Systems Constitutional: denies fever, chills, sweats, unexpected weight change, anorexia, fatigue Allergy: congestion, sneezing ENT: no runny nose, ear pain, sore throat, hoarseness, sinus pain Cardiology: denies chest pain, palpitations, edema Respiratory: denies cough, shortness of breath, wheezing Gastroenterology: denies abdominal pain, nausea,  vomiting, diarrhea, constipation,  Hematology: denies bleeding or bruising problems Musculoskeletal: Positive aches in his hips and legs above the knee, aches in his thighs limit his ability to walk for very long periods; denies arthralgias, joint swelling Ophthalmology: denies vision changes Urology: denies dysuria, difficulty urinating, hematuria, urinary frequency, urgency Neurology: no headache, weakness, tingling, numbness Psychology: denies depressed mood, agitation, sleep problems     Objective:   Physical Exam  Filed Vitals:   01/18/11 0858  BP: 110/68  Pulse: 64  Temp: 97.8 F (36.6 C)   General appearance: alert, no distress, WD/WN, white male, hard of hearing Skin: Right upper external ear with erythematous, raised, and ulcerative lesion concerning for basal cell, bilateral face with multiple areas of scaling and asymmetry, brownish coloration, left dorsal hand with linear crusting and scaling, these have also changed recently HEENT: normocephalic, sclerae anicteric, PERRLA, EOMi, nares patent, no discharge or erythema, pharynx normal, bilateral hearing aids present Oral cavity: MMM, no lesions Neck: supple, no lymphadenopathy, no thyromegaly, no masses, no JVD, no bruits Heart: Faint 2/6 systolic murmur that is brief, heard best in the right upper sternal border, otherwise RRR, normal S1, S2 Lungs: CTA bilaterally, no wheezes, rhonchi, or rales Abdomen: +bs, soft, non tender, non distended, no masses, no hepatomegaly, no splenomegaly Back: non tender, no scoliosis, reduced range of motion in general due to pain Musculoskeletal: nontender, no swelling, no obvious deformity Extremities: no edema, no cyanosis, no clubbing Pulses: 2+ symmetric, upper and lower extremities, normal  cap refill Neurological: alert, oriented x 3, CN2-12 intact, strength normal upper extremities and lower extremities, sensation normal throughout, DTRs 1+ throughout, no cerebellar signs, gait normal,  negative straight-leg raise Psychiatric: normal affect, behavior normal, pleasant    Assessment :    Encounter Diagnoses  Name Primary?  . Essential hypertension, benign Yes  . Hyperlipidemia   . Chronic back pain   . Neoplasm of skin   . Abdominal aortic aneurysm   . Overactive bladder   . History of lymphoma      Plan:    Hypertension-controlled on current medication.  Hyperlipidemia-labs today  Chronic back pain-requested records from [facility], I will review the prior imaging reports, but for now he can continue his pain medication scheduled.  Neoplasm of skin-he has several concerning lesions including his left hand, bilateral cheeks, and right earlobe. We will schedule him followup with his dermatologist.  Abdominal aortic aneurysm and heart murmur-he should be scheduled for followup with his cardiologist in July 2012. He will call to verify.  Overactive bladder-he is doing okay in this regard, he has follow up with urologist for this and BPH in August 2012.  History of lymphoma, anemia, thrombocytopenia-he has followup with hematology in October 2012. Labs today, and we will forward copies of labs and note to his hematologist.  Labs today, fasting, and we spent time on the phone tracking down his upcoming appointments as he was unsure of those times. Discussed his concerns and symptoms, and we will call with lab results.

## 2011-01-18 NOTE — Patient Instructions (Signed)
He will call with lab results.  Advised he use his pain medication scheduled 3 times a day for arthritis and back pain. I called to request prior imaging records from family practice at [facility] regarding his back and neck.   We called and verified the following appointment times since he was not sure.  His next appointment with Dr. Darnelle Catalan, hematology, is 06/07/11 at 2:30 PM.  His next appointment with Dr. Baldo Ash, urology, is 03/29/11 at 10:45 AM.  We scheduled an appointment for followup with his dermatologist, Dr. Margo Aye, for 01/28/11 at 8:30 AM.  We are awaiting a call back from [facility] regarding followup on his abdominal aortic aneurysm. The next appointment should be July of this year. We gave him the phone number so he can also call and get the appointment information.

## 2011-01-19 ENCOUNTER — Telehealth: Payer: Self-pay | Admitting: *Deleted

## 2011-01-19 LAB — SEDIMENTATION RATE: Sed Rate: 18 mm/hr — ABNORMAL HIGH (ref 0–16)

## 2011-01-19 NOTE — Telephone Encounter (Addendum)
Message copied by Dorthula Perfect on Tue Jan 19, 2011  1:43 PM ------      Message from: Jac Canavan      Created: Tue Jan 19, 2011  9:43 AM       Call August Saucer and advise that his anemia is stable, his cholesterol is fine, his liver, kidney, and lytes are ok.  His sugar is a little elevated as before.  Regarding his anemia, I would like him to take some OTC Iron 325mg  once daily with food.  Lets see him back in 53mo.  Pls send copy of notes and labs to Dr. Darnelle Catalan (Hematology), [facility], and Dr. Margo Aye (derm).   Pt's wife notified of lab results.  Will tell pt about lab results and to start OTC iron 325mg  daily with food. Pt scheduled to return for 3 month follow up on 04-20-2011 at 9 am.  Faxed office notes and labs to Dr. Darnelle Catalan, [facility], Dr. Margo Aye and Dr. Baldo Ash.  CM, LPN

## 2011-02-10 ENCOUNTER — Telehealth: Payer: Self-pay | Admitting: Medical

## 2011-02-10 MED ORDER — PREDNISONE 20 MG PO TABS: 20.0000 mg | ORAL_TABLET | Freq: Every day | ORAL | Status: AC

## 2011-02-10 NOTE — Telephone Encounter (Signed)
Called Prednisone 20 mg 2 tablets qd times 5 days #10 with 0 refills to [facility] at [phone].   Pt aware Rx was called in to Pharmacy.   CM, LPN

## 2011-02-10 NOTE — Telephone Encounter (Signed)
Pls call out Prednisone 20mg , 2 tablets po daily x 5 days, #10, no refill.  Pls call out and put in order in epic

## 2011-02-10 NOTE — Telephone Encounter (Signed)
Please call gout flare up wants rx, going oot early in am

## 2011-03-11 ENCOUNTER — Telehealth: Payer: Self-pay | Admitting: *Deleted

## 2011-03-11 NOTE — Telephone Encounter (Signed)
He has hx/o fluctuating BP.  He usually backs off the Exforge when this happens.  Make sure he remembers to cut the Exforge back to 1/2 dose or not take it at all if <120/70.

## 2011-03-11 NOTE — Telephone Encounter (Signed)
Pt's wife aware to only take 1/2 dose of Exforge or not take it at all for BP.  CM,LPN

## 2011-03-11 NOTE — Telephone Encounter (Addendum)
Message copied by Dorthula Perfect on Thu Mar 11, 2011  4:03 PM ------      Message from: Jac Canavan      Created: Thu Mar 11, 2011  3:16 PM       Pls let him know that I reviewed lab result from his cardiologist that his testosterone is quite low.  This can cause fatigue, lack of sexual desire, etc.  If he feels real tired all the time, or if he would like treatment for this, then I would need him to come in for PSA prostate blood test first.   If that is normal, we can consider treatment for low testosterone.              If he doesn't feel fatigued or not worried about this, we don't necessarily have to treat this.              Just let me know.     Called pt and spoke with pt's wife regarding low testosterone.  Pt's wife stated that pt has no energy and she is worried about this and that pt's  BP was 90/52 when she checked it earlier.  Advised pt's wife to keep an eye on pt and that if his BP stayed low or got any lower she might consider taking pt to ER for evaluation.  Pt's wife understood.    Pt has been set up for a nurse visit Monday 03-15-11 at 8:45am for PSA level.  CM, LPN

## 2011-03-15 ENCOUNTER — Other Ambulatory Visit: Payer: Medicare Other

## 2011-03-15 DIAGNOSIS — N400 Enlarged prostate without lower urinary tract symptoms: Secondary | ICD-10-CM

## 2011-03-15 DIAGNOSIS — Z125 Encounter for screening for malignant neoplasm of prostate: Secondary | ICD-10-CM

## 2011-03-15 DIAGNOSIS — Z0000 Encounter for general adult medical examination without abnormal findings: Secondary | ICD-10-CM

## 2011-03-15 LAB — PSA, MEDICARE: PSA: 0.17 ng/mL (ref <=4.00)

## 2011-03-18 ENCOUNTER — Other Ambulatory Visit: Payer: Self-pay | Admitting: Medical

## 2011-03-18 ENCOUNTER — Telehealth: Payer: Self-pay | Admitting: *Deleted

## 2011-03-18 NOTE — Telephone Encounter (Addendum)
Message copied by Dorthula Perfect on Thu Mar 18, 2011  3:01 PM ------      Message from: Aleen Campi, DAVID S      Created: Thu Mar 18, 2011  1:24 PM       His PSA test is normal.  Pls send copy of lab to Dr. Baldo Ash [place] in [place].  Ask him to come in for visit to discuss low testosterone, medication choices, and symptoms.  We usually have a starter kit/samples that he can try first before spending money on this.    Spoke with pt's wife to notify of lab result and faxed copy of PSA to Dr. Baldo Ash.  Pt scheduled to come in 03-22-11 at 3:30 pm.  CM, LPN

## 2011-03-22 ENCOUNTER — Ambulatory Visit (INDEPENDENT_AMBULATORY_CARE_PROVIDER_SITE_OTHER): Payer: Medicare Other | Admitting: Medical

## 2011-03-22 ENCOUNTER — Encounter: Payer: Self-pay | Admitting: Medical

## 2011-03-22 VITALS — BP 120/60 | HR 68 | Temp 97.5°F | Ht 64.0 in | Wt 195.0 lb

## 2011-03-22 DIAGNOSIS — E291 Testicular hypofunction: Secondary | ICD-10-CM

## 2011-03-22 MED ORDER — TESTOSTERONE 10 MG/ACT (2%) TD GEL: 2.0000 | Freq: Every day | TRANSDERMAL | Status: DC

## 2011-03-22 NOTE — Progress Notes (Signed)
Subjective:   HPI  [name] [name] is a 77 y.o. male who presents for f/u at my request.   Since last visit he had seen cardiovascular office for f/u on AAA which is stable.  Apparently labs were done for testosterone given his c/o fatigue.  He was found to have quite low testosterone.  In the meantime, had come by for PSA which was normal.  He saw Dr. Carl/Urology in [place] for BPH symptoms last year.  They had on repeat PSA in the past year, so they asked [place] to forward a copy of results.  At this time he does c/o ongoing fatigue, his wife has mentioned this issue as well.  He notes that he and wife have been together for 17 years, and never any intercourse.  He doesn't really care about sex at this point.  He would like improvement in his energy level though.  He has a friend on Testosterone replacement with improvement in his energy.  Otherwise he has been in his normal state of health.  No other aggravating or relieving factors.  He also saw his dermatologist recently and has some new skin cancers frozen off his ear.  No other c/o.  The following portions of the patient's history were reviewed and updated as appropriate: allergies, current medications, past family history, past medical history, past social history, past surgical history and problem list.  Past Medical History  Diagnosis Date  . Depression   . Aneurysm of abdominal aorta   . History of colon cancer     hospitalization 1999  . Degenerative disc disease, lumbar   . History of non-Hodgkin's lymphoma     hospitalization 2002  . Cataract   . Overactive bladder   . Heart murmur   . Hard of hearing   . Hypertension   . Hyperlipidemia   . Anemia   . Thrombocytopenia   . Chronic renal disease   . Memory loss   . Hyperglycemia   . Edema     Review of Systems Constitutional: +fatigue; denies fever, chills, sweats, unexpected weight change, anorexia Cardiology: denies chest pain, palpitations, edema Respiratory: denies cough,  shortness of breath, wheezing Gastroenterology: denies abdominal pain, nausea, vomiting, diarrhea, constipation Urology: denies dysuria, difficulty urinating, hematuria, urinary frequency, urgency     Objective:   Physical Exam  General appearance: alert, no distress, WD/WN,     Assessment :    Encounter Diagnosis  Name Primary?  . Hypogonadism male Yes      Plan:    Discussed his recent lab results, risks/benefits of testosterone therapy.  Discussed TST replacement options such as gels, shots, etc.  His TST was quite low.  He is interested in trial of TST replacement.  I gave him a sample of Fortesta, discussed safety, proper use, and he will begin with 4 pumps daily.  He will check with pharmacy/insurer about cost/copay.   Recheck in 52mo and lab for TST check at that time.  Spent the bulk of the appt in discussion of labs, proper use of medication, goals of therapy, and f/u.  Reviewed his recent dermatology notes as well.

## 2011-03-22 NOTE — Patient Instructions (Signed)
Apply 2 pumps of the Fortesta to each upper thigh daily.  Avoid showering that area for 2 hours.  Avoid skin contact with others for the next 2 hours.  Wash hands thoroughly after use.  Keep this medication locked/safe.  Recheck in 1 month for blood test.

## 2011-03-30 ENCOUNTER — Ambulatory Visit (INDEPENDENT_AMBULATORY_CARE_PROVIDER_SITE_OTHER): Payer: Medicare Other | Admitting: Medical

## 2011-03-30 ENCOUNTER — Encounter: Payer: Self-pay | Admitting: Medical

## 2011-03-30 DIAGNOSIS — R109 Unspecified abdominal pain: Secondary | ICD-10-CM

## 2011-03-30 DIAGNOSIS — I714 Abdominal aortic aneurysm, without rupture: Secondary | ICD-10-CM

## 2011-03-30 DIAGNOSIS — E291 Testicular hypofunction: Secondary | ICD-10-CM

## 2011-03-30 MED ORDER — OMEPRAZOLE 40 MG PO CPDR: DELAYED_RELEASE_CAPSULE | ORAL | Status: DC

## 2011-03-30 NOTE — Progress Notes (Signed)
Addended by: Jac Canavan on: 03/30/2011 05:05 PM   Modules accepted: Orders

## 2011-03-30 NOTE — Progress Notes (Signed)
  Subjective:   HPI [name] [name] is a 77 y.o. male who presents for abdominal pain.  He has a history significant for colon cancer, status post colostomy, abdominal aortic aneurysm followed by [facility], and history of non-Hodgkin's lymphoma.  He notes the last 3-4 weeks having some stomach pain in the morning, thinks is a hernia.  It is relieved with eating. But at times he feels some discomfort with motion or with contraction abdominal wall like sitting up.  It was a little worse this morning than usual.  He denies any stool changes, no blood in stool, no urinary changes no blood in urine.  No other aggravating or relieving factors.  No other c/o.  The following portions of the patient's history were reviewed and updated as appropriate: allergies, current medications, past family history, past medical history, past social history, past surgical history and problem list.  Past Medical History  Diagnosis Date  . Depression   . Aneurysm of abdominal aorta   . History of colon cancer     hospitalization 1999  . Degenerative disc disease, lumbar   . History of non-Hodgkin's lymphoma     hospitalization 2002  . Cataract   . Overactive bladder   . Heart murmur   . Hard of hearing   . Hypertension   . Hyperlipidemia   . Anemia   . Thrombocytopenia   . Chronic renal disease   . Memory loss   . Hyperglycemia   . Edema      Review of Systems Gen: No fever, chills, sweats, weight changes Skin: No rash, redness or bruising Heart: No chest pain, edema Lungs: No shortness of breath, cough Abdominal: No nausea, vomiting, diarrhea, constipation, blood.  Denies tearing pain, no reflux or heartburn. GU: Positive reduce urinary stream long-standing, no changes in urination     Objective:   Physical Exam  General appearance: alert, no distress, WD/WN, elderly white male Heart: RRR, no murmurs, normal S1, S2 Lungs: CTA Abdomen: vertical central surgical scar, left  lower abdomen with colostomy bag, and underlying opening and tissue appears normal, otherwise slight central abdominal tenderness, no obvious hernia, no obvious bulge, no mass, no organomegaly Pulses: 2+   Assessment :    Encounter Diagnoses  Name Primary?  . Abdominal pain   . Low testosterone Yes  . Abdominal aortic aneurysm       Plan:     Abdominal pain-I called [facility], and his last ultrasound was a year ago.  Given his current abdominal pain, abdominal aortic aneurysm, and history of lymphoma, we will set him up for repeat ultrasound.  In the meantime, have him start omeprazole daily to help with possible ulcer related symptoms.  He will cut back to 1/2 tablet Aspirin daily for now until symptoms improved.  Low testosterone-he will continue [place] he recently started, but some additional labs today   Abdominal aortic aneurysm-repeat ultrasound for surveillance

## 2011-03-31 LAB — PROLACTIN: Prolactin: 8 ng/mL (ref 2.1–17.1)

## 2011-03-31 LAB — LUTEINIZING HORMONE: LH: 39.7 m[IU]/mL — ABNORMAL HIGH (ref 3.1–34.6)

## 2011-03-31 LAB — FOLLICLE STIMULATING HORMONE: FSH: 84.8 m[IU]/mL — ABNORMAL HIGH (ref 1.4–18.1)

## 2011-04-02 ENCOUNTER — Telehealth: Payer: Self-pay | Admitting: *Deleted

## 2011-04-02 ENCOUNTER — Ambulatory Visit
Admission: RE | Admit: 2011-04-02 | Discharge: 2011-04-02 | Disposition: A | Discharge status: Home / Self Care | Payer: Medicare Other | Source: Ambulatory Visit | Attending: Medical | Admitting: Medical

## 2011-04-02 NOTE — Telephone Encounter (Addendum)
Message copied by Dorthula Perfect on Fri Apr 02, 2011 12:56 PM ------      Message from: Jac Canavan      Created: Fri Apr 02, 2011 12:33 PM       His other labs are ok.  Discussed the lab results and low testosterone with Dr. Susann Givens.  C/t testosterone for now.              His ultrasound shows left kidney stone that has not moved, is stable, his aneurysm is stable, otherwise ultrasound normal.              Lets have him take the Omeprazole 40mg  1 tablet in the morning on empty stomach 40 min before breakfast and see if that relieves his belly pain.   Pt notified of lab results and ultrasound.  Pt agreed to take omeprazole 40 mg qd before breakfast 40 min on empty.  CM, LPN

## 2011-04-06 ENCOUNTER — Other Ambulatory Visit: Payer: Self-pay | Admitting: Medical

## 2011-04-20 ENCOUNTER — Ambulatory Visit: Payer: Medicare Other | Admitting: Medical

## 2011-04-20 ENCOUNTER — Ambulatory Visit (INDEPENDENT_AMBULATORY_CARE_PROVIDER_SITE_OTHER): Payer: Medicare Other | Admitting: Medical

## 2011-04-20 ENCOUNTER — Encounter: Payer: Self-pay | Admitting: Medical

## 2011-04-20 VITALS — BP 112/72 | HR 60 | Temp 97.7°F | Resp 20 | Ht 64.0 in | Wt 195.0 lb

## 2011-04-20 DIAGNOSIS — M810 Age-related osteoporosis without current pathological fracture: Secondary | ICD-10-CM

## 2011-04-20 DIAGNOSIS — D649 Anemia, unspecified: Secondary | ICD-10-CM

## 2011-04-20 DIAGNOSIS — R7301 Impaired fasting glucose: Secondary | ICD-10-CM

## 2011-04-20 DIAGNOSIS — E291 Testicular hypofunction: Secondary | ICD-10-CM

## 2011-04-20 DIAGNOSIS — L74519 Primary focal hyperhidrosis, unspecified: Secondary | ICD-10-CM

## 2011-04-20 DIAGNOSIS — R61 Generalized hyperhidrosis: Secondary | ICD-10-CM

## 2011-04-20 DIAGNOSIS — R109 Unspecified abdominal pain: Secondary | ICD-10-CM

## 2011-04-20 MED ORDER — ALENDRONATE SODIUM 70 MG PO TABS: 70.0000 mg | ORAL_TABLET | ORAL | Status: DC
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
## 2011-04-20 NOTE — Patient Instructions (Signed)
Follow up as planned on 06/12/11 with Dr. Darnelle Catalan.  Follow up as planned with Dr. Margo Aye Dermatology.  I refilled Fosamax today.  This is a pill you take once per week on an empty stomach, first thing in the morning to help restore bone integrity.  Your diabetes screen was unchanged at 6.2%.  Thus, you are considered in the pre-diabetic category.  Be careful to keep portions in check, don't eat a lot of sweets, and keep portions of bread and potatoes reasonable.  Avoid unnecessary sugar such as in soft drinks and sweet tea.    We are rechecking your testosterone level today.    You can continue Omeprazole for abdominal pain.

## 2011-04-21 ENCOUNTER — Other Ambulatory Visit: Payer: Medicare Other

## 2011-04-22 LAB — TESTOSTERONE, FREE, TOTAL, SHBG: Testosterone, Free: 36.5 pg/mL — ABNORMAL LOW (ref 47.0–244.0)

## 2011-04-23 ENCOUNTER — Telehealth: Payer: Self-pay | Admitting: *Deleted

## 2011-04-23 NOTE — Telephone Encounter (Addendum)
Message copied by Dorthula Perfect on Fri Apr 23, 2011  1:05 PM ------      Message from: Jac Canavan      Created: Fri Apr 23, 2011  7:51 AM       Testosterone numbers are significantly improved.   Lets c/t same dose for now and recheck it in 44mo.  Pt notified of lab results.  Will continue same dose and is scheduled to return for a follow up on testosterone on 07-16-11 at 1:30 pm.  CM, LPN

## 2011-04-29 ENCOUNTER — Telehealth: Payer: Self-pay | Admitting: *Deleted

## 2011-05-06 ENCOUNTER — Telehealth: Payer: Self-pay | Admitting: Medical

## 2011-05-06 NOTE — Telephone Encounter (Signed)
Done

## 2011-05-11 ENCOUNTER — Telehealth: Payer: Self-pay | Admitting: Medical

## 2011-05-11 NOTE — Telephone Encounter (Signed)
FYI ONLY

## 2011-05-11 NOTE — Telephone Encounter (Signed)
I spoke to wife.   She said he has had several falls, mostly outside.  However, last week he was standing and out of the blue just fell down.  He denied any dizziness, nausea, denied symptoms suggestive of a faint, denied getting up fast, but in general just fell out without tripping on an object.  I advised that this could represent a significant cause.  I advised he come in for eval.  May need to have imaging vs cardiology consult, etc.  Wife will make him an appt.

## 2011-05-12 ENCOUNTER — Encounter: Payer: Self-pay | Admitting: Medical

## 2011-05-12 ENCOUNTER — Ambulatory Visit (INDEPENDENT_AMBULATORY_CARE_PROVIDER_SITE_OTHER): Payer: Medicare Other | Admitting: Medical

## 2011-05-12 VITALS — BP 120/60 | HR 68 | Temp 97.6°F | Resp 16 | Ht 64.0 in | Wt 191.0 lb

## 2011-05-12 DIAGNOSIS — W19XXXA Unspecified fall, initial encounter: Secondary | ICD-10-CM

## 2011-05-12 DIAGNOSIS — R011 Cardiac murmur, unspecified: Secondary | ICD-10-CM

## 2011-05-12 DIAGNOSIS — L03119 Cellulitis of unspecified part of limb: Secondary | ICD-10-CM

## 2011-05-12 DIAGNOSIS — M25561 Pain in right knee: Secondary | ICD-10-CM

## 2011-05-12 DIAGNOSIS — M25569 Pain in unspecified knee: Secondary | ICD-10-CM

## 2011-05-12 MED ORDER — AMOXICILLIN-POT CLAVULANATE 875-125 MG PO TABS: 1.0000 | ORAL_TABLET | Freq: Two times a day (BID) | ORAL | Status: AC

## 2011-05-12 NOTE — Patient Instructions (Addendum)
Falls can be caused by various things.  Your knee arthritis could be causing you to be unstable and fall, but so could other things such as your vision, heart, etc.  You do have a history of murmur.   Because your falls come out of the blue, I want you to recheck with Dr. Royann Shivers to discuss your falls and make sure he doesn't feel like your heart is the cause.    I have made you an appointment to see Dr. Royann Shivers at [facility] on 05/17/2011 at 4pm.  They said to be there at 3:30pm and bring insurance cards and ALL your medications.    I want you continue your usual medications.  You are also due for follow up with your eye doctor.   Please see your eye doctor relatively soon for recheck.   Regarding your leg pain, I am going to treat you for possible infection of the left leg.  I want you to take Augmentin twice daily or 10 days. Call or return if the knee gets much larger, red, or worse pain or fever.   Your knee xray today shows narrowing of the joint spaces in your knees.   Pending your cardiology appointment, I would like you to see orthopedics regarding knee pains.
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
## 2011-05-14 LAB — CBC
HCT: 35.3 — ABNORMAL LOW
Platelets: 140 — ABNORMAL LOW
WBC: 6.1

## 2011-05-14 LAB — DIFFERENTIAL
Eosinophils Relative: 3
Lymphocytes Relative: 16
Lymphs Abs: 1

## 2011-05-14 LAB — URIC ACID: Uric Acid, Serum: 7.5

## 2011-05-20 ENCOUNTER — Other Ambulatory Visit: Payer: Self-pay | Admitting: Medical

## 2011-05-20 MED ORDER — TESTOSTERONE 20.25 MG/ACT (1.62%) TD GEL: 2.0000 | Freq: Every day | TRANSDERMAL | Status: DC

## 2011-05-20 NOTE — Telephone Encounter (Signed)
PLEASE SIGN OFF ON RX-LM

## 2011-06-07 ENCOUNTER — Other Ambulatory Visit: Payer: Self-pay | Admitting: Oncology

## 2011-06-07 ENCOUNTER — Encounter (HOSPITAL_BASED_OUTPATIENT_CLINIC_OR_DEPARTMENT_OTHER): Payer: Medicare Other | Admitting: Oncology

## 2011-06-07 DIAGNOSIS — C8583 Other specified types of non-Hodgkin lymphoma, intra-abdominal lymph nodes: Secondary | ICD-10-CM

## 2011-06-07 DIAGNOSIS — Z85048 Personal history of other malignant neoplasm of rectum, rectosigmoid junction, and anus: Secondary | ICD-10-CM

## 2011-06-07 DIAGNOSIS — Z87898 Personal history of other specified conditions: Secondary | ICD-10-CM

## 2011-06-07 LAB — COMPREHENSIVE METABOLIC PANEL
Albumin: 4.1 g/dL (ref 3.5–5.2)
BUN: 27 mg/dL — ABNORMAL HIGH (ref 6–23)
CO2: 24 mEq/L (ref 19–32)
Calcium: 9.1 mg/dL (ref 8.4–10.5)
Chloride: 108 mEq/L (ref 96–112)
Creatinine, Ser: 1.59 mg/dL — ABNORMAL HIGH (ref 0.50–1.35)
Glucose, Bld: 103 mg/dL — ABNORMAL HIGH (ref 70–99)

## 2011-06-07 LAB — CBC WITH DIFFERENTIAL/PLATELET
Basophils Absolute: 0 10*3/uL (ref 0.0–0.1)
Eosinophils Absolute: 0.3 10*3/uL (ref 0.0–0.5)
HCT: 33.7 % — ABNORMAL LOW (ref 38.4–49.9)
HGB: 11.4 g/dL — ABNORMAL LOW (ref 13.0–17.1)
MCH: 31.8 pg (ref 27.2–33.4)
MONO#: 0.6 10*3/uL (ref 0.1–0.9)
NEUT#: 3 10*3/uL (ref 1.5–6.5)
NEUT%: 62.5 % (ref 39.0–75.0)
WBC: 4.8 10*3/uL (ref 4.0–10.3)
lymph#: 0.9 10*3/uL (ref 0.9–3.3)

## 2011-06-07 LAB — LACTATE DEHYDROGENASE: LDH: 146 U/L (ref 94–250)

## 2011-07-16 ENCOUNTER — Encounter: Payer: Self-pay | Admitting: Medical

## 2011-07-16 ENCOUNTER — Ambulatory Visit (INDEPENDENT_AMBULATORY_CARE_PROVIDER_SITE_OTHER): Payer: Medicare Other | Admitting: Medical

## 2011-07-16 VITALS — BP 130/60 | HR 64 | Temp 97.7°F | Resp 16 | Wt 191.0 lb

## 2011-07-16 DIAGNOSIS — E291 Testicular hypofunction: Secondary | ICD-10-CM

## 2011-07-16 DIAGNOSIS — H539 Unspecified visual disturbance: Secondary | ICD-10-CM | POA: Insufficient documentation

## 2011-07-16 DIAGNOSIS — R42 Dizziness and giddiness: Secondary | ICD-10-CM

## 2011-07-16 NOTE — Progress Notes (Signed)
Subjective:   HPI  [name] [name] is a 77 y.o. male who presents for follow up on testosterone.  He has been doing one pump each shoulder daily of AndroGel and does see improvement in his energy level. He is due for recheck and lab on this today.  He also notes about 3 weeks ago he had an episode where he had immediate dizzy spell, headache, and felt like he was in a fog for a brief few seconds. This has happened 2 other times in the past.  He wonders if he had a mini stroke.  He still occasionally has dizzy spells, has occasional ringing in his ear.  He has taken meclizine ongoing for vertigo in general. Last head imaging was several years ago. He had carotid ultrasounds many years ago.  No other aggravating or relieving factors.    No other c/o.  The following portions of the patient's history were reviewed and updated as appropriate: allergies, current medications, past family history, past medical history, past social history, past surgical history and problem list.  Past Medical History  Diagnosis Date  . Depression   . Aneurysm of abdominal aorta     [facility], Dr. Royann Shivers  . History of colon cancer     hospitalization 1999  . Degenerative disc disease, lumbar   . History of non-Hodgkin's lymphoma     hospitalization 2002  . Cataract   . Hard of hearing     wears hearing aids  . Hypertension   . Hyperlipidemia   . Thrombocytopenia     Dr. Darnelle Catalan  . Chronic renal disease   . Memory loss   . Hyperglycemia   . Edema   . Heart murmur     Echocardiogram 7/10, stress test 2008, [facility]  . Overactive bladder   . Anemia     Dr. Darnelle Catalan    Review of Systems Constitutional: -fever, -chills, -sweats, -unexpected -weight change,-fatigue ENT: -runny nose, -ear pain, -sore throat Cardiology:  -chest pain, -palpitations, -edema Respiratory: -cough, -shortness of breath, -wheezing Gastroenterology: -abdominal pain, -nausea, -vomiting, -diarrhea, -constipation Hematology: -bleeding  or bruising problems Musculoskeletal: +arthralgias, -myalgias, +joint swelling, +back pain Ophthalmology: -vision changes Urology: -dysuria, -difficulty urinating, -hematuria, -urinary frequency, -urgency Neurology: -headache, -weakness, -tingling, -numbness   Objective:   Physical Exam  Filed Vitals:   07/16/11 1333  BP: 130/60  Pulse: 64  Temp: 97.7 F (36.5 C)  Resp: 16    General appearance: alert, no distress, WD/WN, elderly white male, hard of hearing, wearing hearing aids HEENT: normocephalic, sclerae anicteric, PERRLA, EOMi, nares patent, no discharge or erythema, pharynx normal Oral cavity: MMM, no lesions Neck: supple, no lymphadenopathy, no thyromegaly, no masses, no bruits Heart: RRR, normal S1, S2, no murmurs Lungs: CTA bilaterally, no wheezes, rhonchi, or rales Extremities: no edema, no cyanosis, no clubbing Pulses: 2+ symmetric, upper and lower extremities, normal cap refill Neurological: alert, oriented x 3, CN2-12 intact, strength normal upper extremities and lower extremities, sensation normal throughout, DTRs 2+ throughout, no cerebellar signs, gait normal Psychiatric: normal affect, behavior normal, pleasant    Assessment and Plan :    Encounter Diagnoses  Name Primary?  . Hypogonadism male Yes  . Dizziness   . Visual disturbance    Hypogonadism-continue testosterone therapy with AndroGel 2 pumps daily, labs today  Dizziness, visual disturbance-I will discuss case with supervising physician and will likely consider carotid dopplers or other eval.  Follow-up pending studies

## 2011-07-19 LAB — TESTOSTERONE, FREE, TOTAL, SHBG
Sex Hormone Binding: 31 nmol/L (ref 13–71)
Testosterone, Free: 58.4 pg/mL (ref 47.0–244.0)

## 2011-07-20 ENCOUNTER — Other Ambulatory Visit: Payer: Self-pay | Admitting: Medical

## 2011-07-20 DIAGNOSIS — R42 Dizziness and giddiness: Secondary | ICD-10-CM

## 2011-07-23 ENCOUNTER — Ambulatory Visit
Admission: RE | Admit: 2011-07-23 | Discharge: 2011-07-23 | Disposition: A | Discharge status: Home / Self Care | Payer: Medicare Other | Source: Ambulatory Visit | Attending: Medical | Admitting: Medical

## 2011-07-23 DIAGNOSIS — R42 Dizziness and giddiness: Secondary | ICD-10-CM

## 2011-07-28 ENCOUNTER — Other Ambulatory Visit: Payer: Self-pay | Admitting: Medical

## 2011-07-28 DIAGNOSIS — R410 Disorientation, unspecified: Secondary | ICD-10-CM

## 2011-07-28 DIAGNOSIS — R42 Dizziness and giddiness: Secondary | ICD-10-CM

## 2011-07-28 DIAGNOSIS — H9319 Tinnitus, unspecified ear: Secondary | ICD-10-CM | POA: Insufficient documentation

## 2011-07-28 DIAGNOSIS — W19XXXA Unspecified fall, initial encounter: Secondary | ICD-10-CM

## 2011-07-28 NOTE — Progress Notes (Signed)
DONE. CLS

## 2011-08-06 ENCOUNTER — Telehealth: Payer: Self-pay | Admitting: Family Medicine

## 2011-08-06 NOTE — Telephone Encounter (Signed)
He is on Rapaflo for BPH/enlarged prostate.  If he is experience urine flow changes, then it may be best to cut down the testosterone dose or stop it all together.  He is can either use half the amount pumps he is doing, or can stop the testosterone.  Like any medication recommendation, we have to weight the benefits vs risks.  The risk of stopping the testosterone would be less energetic feeling, but I don't want him having worse urinary issues.

## 2011-08-11 ENCOUNTER — Ambulatory Visit
Admission: RE | Admit: 2011-08-11 | Discharge: 2011-08-11 | Disposition: A | Discharge status: Home / Self Care | Payer: Medicare Other | Source: Ambulatory Visit | Attending: Medical | Admitting: Medical

## 2011-08-11 DIAGNOSIS — R42 Dizziness and giddiness: Secondary | ICD-10-CM

## 2011-08-11 DIAGNOSIS — W19XXXA Unspecified fall, initial encounter: Secondary | ICD-10-CM

## 2011-08-11 DIAGNOSIS — R410 Disorientation, unspecified: Secondary | ICD-10-CM

## 2011-08-11 MED ORDER — GADOBENATE DIMEGLUMINE 529 MG/ML IV SOLN
18.0000 mL | Freq: Once | INTRAVENOUS | Status: AC | PRN
  Administered 2011-08-11: 18 mL via INTRAVENOUS

## 2011-08-11 NOTE — Telephone Encounter (Signed)
PATIENTS WIFE WAS NOTIFIED OF WHAT SHANE RECOMMEND FOR HIS MEDICATION CHANGE ON THE TESTERONE CHANGES. CLS

## 2011-08-17 ENCOUNTER — Other Ambulatory Visit: Payer: Self-pay | Admitting: Medical

## 2011-08-18 NOTE — Telephone Encounter (Signed)
pls call in his pain medication, #270 no refill, 90 day supply, see chart order for Hydrocodone.

## 2011-08-18 NOTE — Telephone Encounter (Signed)
rx refill on lorcet.

## 2011-08-19 ENCOUNTER — Telehealth: Payer: Self-pay | Admitting: Medical

## 2011-08-19 MED ORDER — HYDROCODONE-ACETAMINOPHEN 10-650 MG PO TABS: 1.0000 | ORAL_TABLET | Freq: Four times a day (QID) | ORAL | Status: DC | PRN

## 2011-08-19 NOTE — Telephone Encounter (Signed)
Phone in the hydrocodone

## 2011-08-19 NOTE — Telephone Encounter (Signed)
Called med in per jcl 

## 2011-08-19 NOTE — Telephone Encounter (Signed)
Needs refill on [employer]  Looks like Vincenza Hews refilled on ?08/18/11 and it states phoned in but pharmacy does not have and I do not see any documentation of a Rx being phoned in.  Please call patient and let them know when taken care of

## 2011-08-19 NOTE — Telephone Encounter (Signed)
°  Needs refill on [employer]  Looks like Vincenza Hews refilled on ?08/18/11 and it states phoned in but pharmacy does not have and I do not see any documentation of a Rx being phoned in.  Please call patient and let them know when taken care of

## 2011-08-27 ENCOUNTER — Ambulatory Visit (INDEPENDENT_AMBULATORY_CARE_PROVIDER_SITE_OTHER): Payer: Medicare Other | Admitting: Medical

## 2011-08-27 ENCOUNTER — Encounter: Payer: Self-pay | Admitting: Medical

## 2011-08-27 VITALS — BP 112/60 | HR 60 | Temp 97.6°F | Wt 186.0 lb

## 2011-08-27 DIAGNOSIS — R198 Other specified symptoms and signs involving the digestive system and abdomen: Secondary | ICD-10-CM

## 2011-08-27 DIAGNOSIS — N400 Enlarged prostate without lower urinary tract symptoms: Secondary | ICD-10-CM | POA: Insufficient documentation

## 2011-08-27 DIAGNOSIS — E291 Testicular hypofunction: Secondary | ICD-10-CM

## 2011-08-27 LAB — POCT URINALYSIS DIPSTICK
Blood, UA: NEGATIVE
Ketones, UA: NEGATIVE
Protein, UA: NEGATIVE
Spec Grav, UA: 1.01
Urobilinogen, UA: NEGATIVE

## 2011-08-27 MED ORDER — FINASTERIDE 5 MG PO TABS: 5.0000 mg | ORAL_TABLET | Freq: Every day | ORAL | Status: DC

## 2011-08-27 NOTE — Progress Notes (Signed)
Subjective: [name] presents with ongoing complaint of pressure in his rectal area.  He says this started when he began back on testosterone therapy.  He also reports of late having some sort of odor in the rectal area.  He also reports urine stream is decreased, but in general he is not getting up that much to urinate at night like he was in the past.  Denies fever, chills, and he is also here to followup on recent MRI of his head due to equilibrium issues and dizziness.  Past Medical History  Diagnosis Date  . Depression   . Aneurysm of abdominal aorta     [facility], Dr. Royann Shivers  . History of colon cancer     hospitalization 1999  . Degenerative disc disease, lumbar   . History of non-Hodgkin's lymphoma     hospitalization 2002  . Cataract   . Hard of hearing     wears hearing aids  . Hypertension   . Hyperlipidemia   . Thrombocytopenia     Dr. Darnelle Catalan  . Chronic renal disease   . Memory loss   . Hyperglycemia   . Edema   . Heart murmur     Echocardiogram 7/10, stress test 2008, [facility]  . Overactive bladder   . Anemia     Dr. Darnelle Catalan   Review of systems: Gen.: No fever, chills, sweats weight changes Skin: No rash, redness GI: No abdominal pain, nausea, vomiting, diarrhea GU: No dysuria, positive decreased urine stream    Objective:   Physical Exam  Filed Vitals:   08/27/11 1415  BP: 112/60  Pulse: 60  Temp: 97.6 F (36.4 C)    General appearance: alert, no distress, WD/WN Abdomen: +bs, soft, non tender, non distended, no masses, no hepatomegaly, no splenomegaly Rectal: Anus and perineal area nontender, no redness, no fluctuance, no induration, anus is not patent due to his colostomy  Assessment and Plan :    Encounter Diagnoses  Name Primary?  . Rectal pressure Yes  . BPH (benign prostatic hyperplasia)   . Hypogonadism male    Discuss case with supervising physician Dr. Susann Givens, and we will add Proscar to help shrink the prostate.  He is already on rapid  flow to help with urine stream.  He will call report in one month.  We will also need to recheck a BMET in a month due to renal insufficiency.  We may consider restarting testosterone therapy provided the rectal pressure symptoms resolve.

## 2011-09-26 ENCOUNTER — Other Ambulatory Visit: Payer: Self-pay | Admitting: Medical

## 2011-09-27 NOTE — Telephone Encounter (Signed)
Patient needs to make a office visit appointment before his RX runs out. Thanks

## 2011-10-27 ENCOUNTER — Other Ambulatory Visit: Payer: Self-pay | Admitting: Medical

## 2011-10-28 NOTE — Telephone Encounter (Signed)
pls call out his Niaspan with 3 refills

## 2011-10-28 NOTE — Telephone Encounter (Signed)
Is this ok?

## 2011-11-11 ENCOUNTER — Telehealth: Payer: Self-pay | Admitting: Medical

## 2011-11-11 NOTE — Telephone Encounter (Signed)
I CALLED [facility] [phone] & FOUND OUT THEY HAD CONTACTED SHANE'S OLD OFFICE & WAS TOLD THAT WRONG INFO.  GAVE OUR T# & FAX #

## 2011-11-11 NOTE — Telephone Encounter (Signed)
Okay for refill on ostomy supplies (?was there a form to be signed/faxed)

## 2011-12-14 ENCOUNTER — Other Ambulatory Visit: Payer: Self-pay | Admitting: Medical

## 2011-12-15 NOTE — Telephone Encounter (Signed)
RX REFILL 

## 2011-12-23 ENCOUNTER — Other Ambulatory Visit: Payer: Self-pay | Admitting: Medical

## 2011-12-27 ENCOUNTER — Encounter: Payer: Self-pay | Admitting: Medical

## 2011-12-27 ENCOUNTER — Ambulatory Visit (INDEPENDENT_AMBULATORY_CARE_PROVIDER_SITE_OTHER): Payer: Medicare Other | Admitting: Medical

## 2011-12-27 VITALS — BP 120/70 | HR 68 | Temp 98.0°F | Resp 16 | Ht 63.5 in | Wt 186.0 lb

## 2011-12-27 DIAGNOSIS — R7301 Impaired fasting glucose: Secondary | ICD-10-CM

## 2011-12-27 DIAGNOSIS — I10 Essential (primary) hypertension: Secondary | ICD-10-CM

## 2011-12-27 DIAGNOSIS — R413 Other amnesia: Secondary | ICD-10-CM

## 2011-12-27 DIAGNOSIS — E785 Hyperlipidemia, unspecified: Secondary | ICD-10-CM

## 2011-12-27 DIAGNOSIS — D649 Anemia, unspecified: Secondary | ICD-10-CM

## 2011-12-27 DIAGNOSIS — G8929 Other chronic pain: Secondary | ICD-10-CM

## 2011-12-27 DIAGNOSIS — I714 Abdominal aortic aneurysm, without rupture: Secondary | ICD-10-CM

## 2011-12-27 DIAGNOSIS — M549 Dorsalgia, unspecified: Secondary | ICD-10-CM

## 2011-12-27 LAB — COMPREHENSIVE METABOLIC PANEL
Albumin: 4.1 g/dL (ref 3.5–5.2)
Alkaline Phosphatase: 67 U/L (ref 39–117)
BUN: 26 mg/dL — ABNORMAL HIGH (ref 6–23)
CO2: 29 mEq/L (ref 19–32)
Glucose, Bld: 112 mg/dL — ABNORMAL HIGH (ref 70–99)
Potassium: 4.7 mEq/L (ref 3.5–5.3)
Total Protein: 6.8 g/dL (ref 6.0–8.3)

## 2011-12-27 LAB — HEMOGLOBIN A1C: Mean Plasma Glucose: 146 mg/dL — ABNORMAL HIGH (ref <117)

## 2011-12-27 LAB — LIPID PANEL
Cholesterol: 101 mg/dL (ref 0–200)
HDL: 43 mg/dL (ref >39)
Total CHOL/HDL Ratio: 2.3 Ratio
Triglycerides: 131 mg/dL (ref <150)
VLDL: 26 mg/dL (ref 0–40)

## 2011-12-27 LAB — CBC
Hemoglobin: 12.2 g/dL — ABNORMAL LOW (ref 13.0–17.0)
MCH: 32.4 pg (ref 26.0–34.0)
Platelets: 134 10*3/uL — ABNORMAL LOW (ref 150–400)
RBC: 3.76 MIL/uL — ABNORMAL LOW (ref 4.22–5.81)
WBC: 5.1 10*3/uL (ref 4.0–10.5)

## 2011-12-27 NOTE — Progress Notes (Signed)
Subjective:   HPI  [name] [name] is a 78 y.o. male who presents for routine f/u.   Been in usual state of health. No new c/o.  HTN - using 1/2 tablet of Exforge and 1/2 of Atenolol daily.  Hyperlipidemia - compliant with current medication, no c/o  BPH - after starting Rapaflo, not having any more pelvic pressure that seemed to worsen with testosterone therapy.  No current c/o.    AAA - been "a while" since last visit with [facility], last ultrasound 2012.  Anemia - sees Dr. Darnelle Catalan yearly, next visit 04/2012.    Dermatology - saw recently and had additional biopsies and treatment for multiple skin issues.  Hx/o skin cancer.   Stays active, walks when he can, but limited by chronic back pains.  Reads mysteries, target shoots regularly and enjoys this.  Denies any recent falls.  Uses can some.    He notes longstanding memory problems, mainly short term, but no acute changes.  This does aggravate him some times.   No other c/o.  The following portions of the patient's history were reviewed and updated as appropriate: allergies, current medications, past family history, past medical history, past social history, past surgical history and problem list.  Past Medical History  Diagnosis Date  . Depression   . Aneurysm of abdominal aorta     [facility], Dr. Royann Shivers  . History of colon cancer     hospitalization 1999  . Degenerative disc disease, lumbar   . History of non-Hodgkin's lymphoma     hospitalization 2002  . Cataract   . Hard of hearing     wears hearing aids  . Hypertension   . Hyperlipidemia   . Thrombocytopenia     Dr. Darnelle Catalan  . Chronic renal disease   . Memory loss   . Hyperglycemia   . Edema   . Heart murmur     Echocardiogram 7/10, stress test 2008, [facility]  . Overactive bladder   . Anemia     Dr. Darnelle Catalan    No Known Allergies   Review of Systems ROS reviewed and was negative other than noted in HPI or above.    Objective:   Physical Exam  General appearance: alert, no distress, WD/WN, pleasant white male Oral cavity: MMM, no lesions Neck: supple, no lymphadenopathy, no thyromegaly, no masses Heart: RRR, normal S1, S2, no murmurs Lungs: CTA bilaterally, no wheezes, rhonchi, or rales Abdomen: +bs, soft, non tender, non distended, no masses, no hepatomegaly, no splenomegaly Pulses: 2+ symmetric, upper and lower extremities, normal cap refill Neuro: CN2-12 intact, non focal Back: lumbar generalized tenderness, mild to moderate decreased ROM, -SLR   Assessment and Plan :     Encounter Diagnoses  Name Primary?  . Essential hypertension, benign Yes  . Hyperlipidemia   . Memory impairment   . Impaired fasting blood sugar   . Anemia   . AAA (abdominal aortic aneurysm)   . Chronic back pain    HTN - controlled on current medication  Hyperlipidemia - labs today, c/t current medications for heart disease risk factor reduction  Memory impairment - MMSE today with 28/30 score.  Discussed medications that could help memory, but he declines, and states that the memory issue is not new and doesn't bother him enough to warrant medication trial.  No new acute changes.  He is mentally active, reads mysteries, is an avid gun enthusiast, target shoots often  impaired fasting glucose - labs today  Anemia -  chronic, stable, labs today  AAA - last ultrasound 03/2011.  Plan to repeat 03/2012  Chronic back pain - discussed his current medication, and options including physical therapy, low dose NSAID pending renal function studies.  reviewed prior xrays and MRI imaging.

## 2011-12-30 ENCOUNTER — Other Ambulatory Visit: Payer: Self-pay | Admitting: Medical

## 2011-12-30 NOTE — Telephone Encounter (Signed)
Is this ok?

## 2011-12-31 NOTE — Telephone Encounter (Signed)
I CALLED OUT LORCET TO THE PATIENTS [facility] PER SHANE TYSINGER PA-C. CLS

## 2012-01-03 ENCOUNTER — Telehealth: Payer: Self-pay | Admitting: Family Medicine

## 2012-01-03 ENCOUNTER — Other Ambulatory Visit: Payer: Self-pay | Admitting: Medical

## 2012-01-03 MED ORDER — ESCITALOPRAM OXALATE 20 MG PO TABS: 20.0000 mg | ORAL_TABLET | Freq: Every day | ORAL | Status: DC

## 2012-01-05 NOTE — Telephone Encounter (Signed)
SHANE TYSINGER PA-C TOOK CARE OF THIS RX REFILL. CLS

## 2012-01-15 DIAGNOSIS — I4891 Unspecified atrial fibrillation: Secondary | ICD-10-CM

## 2012-01-15 HISTORY — DX: Unspecified atrial fibrillation: I48.91

## 2012-01-19 ENCOUNTER — Ambulatory Visit: Payer: Medicare Other | Admitting: Medical

## 2012-01-20 ENCOUNTER — Ambulatory Visit: Payer: Medicare Other | Admitting: Medical

## 2012-01-24 ENCOUNTER — Encounter: Payer: Self-pay | Admitting: Medical

## 2012-01-24 ENCOUNTER — Ambulatory Visit (INDEPENDENT_AMBULATORY_CARE_PROVIDER_SITE_OTHER): Payer: Medicare Other | Admitting: Medical

## 2012-01-24 VITALS — BP 108/68 | HR 72 | Temp 98.0°F | Resp 18 | Wt 187.0 lb

## 2012-01-24 DIAGNOSIS — E119 Type 2 diabetes mellitus without complications: Secondary | ICD-10-CM

## 2012-01-24 DIAGNOSIS — I959 Hypotension, unspecified: Secondary | ICD-10-CM

## 2012-01-24 DIAGNOSIS — E785 Hyperlipidemia, unspecified: Secondary | ICD-10-CM

## 2012-01-24 MED ORDER — SIMVASTATIN 10 MG PO TABS: 10.0000 mg | ORAL_TABLET | Freq: Every day | ORAL | Status: DC

## 2012-01-24 NOTE — Patient Instructions (Signed)
Your blood pressures are running too low from what you are saying.    Changes today regarding your blood pressure medications:  Increase your Atenolol to 25mg , 1 tablet daily  STOP the Exforge for now  You can check the blood pressure 2-3 times per week and write these numbers down AND bring them next time along with your blood pressure meter so we can compare.   Cholesterol - your numbers are great, actually a little too good.    i want you to cut your cholesterol medication, Zocor in half. Start taking 1/2 tablet daily.   When you are due for refills, I will refill the lower dose of 10mg  daily.   Your blood sugars now show diabetes.  Recommendations:  Drink water  Avoid regular soda or sweet tea.  Instead drink diet soda or unsweet tea  Limit candy, cakes, pies, sweets, etc.  Eat 2-3 fruits daily  Eat plenty of vegetables  But limit meat to lean small portions  Avoid fast food

## 2012-01-24 NOTE — Progress Notes (Signed)
Subjective:   HPI  [name] [name] is a 78 y.o. male who presents for concerns of low BP.  His wife made the appt due to concern of low BP readings.  He has been on the current regimen for 2-3 years now, but only takes half tablets daily of the Exforge and Atenolol.   Despite taking 1/2 tablet daily, he notes consistent BP readings of recent months of 90 SBP.  He denies any recent symptoms though.   Otherwise been in usual state of health, no dizziness, chest pain, DOE, edema.  No other c/o.  The following portions of the patient's history were reviewed and updated as appropriate: allergies, current medications, past family history, past medical history, past social history, past surgical history and problem list.  Past Medical History  Diagnosis Date  . Depression   . Aneurysm of abdominal aorta     [facility], Dr. Royann Shivers  . History of colon cancer     hospitalization 1999  . Degenerative disc disease, lumbar   . History of non-Hodgkin's lymphoma     hospitalization 2002  . Cataract   . Hard of hearing     wears hearing aids  . Hypertension   . Hyperlipidemia   . Thrombocytopenia     Dr. Darnelle Catalan  . Chronic renal disease   . Memory loss   . Hyperglycemia   . Edema   . Heart murmur     Echocardiogram 7/10, stress test 2008, [facility]  . Overactive bladder   . Anemia     Dr. Darnelle Catalan    No Known Allergies   Review of Systems ROS reviewed and was negative other than noted in HPI or above.    Objective:   Physical Exam  General appearance: alert, no distress, WD/WN Heart: RRR, normal S1, S2, no murmurs Lungs: CTA bilaterally, no wheezes, rhonchi, or rales Pulses: 2+ symmetric, upper and lower extremities, normal cap refill   Assessment and Plan :     Encounter Diagnoses  Name Primary?  . Hypotension Yes  . Hyperlipidemia   . Type II or unspecified type diabetes mellitus without mention of complication, not stated as uncontrolled    Hypotension - given his recent  readings and prior BP readings, we will back off on Exforge.  This has been an intermittent problem the past 2 years and we have tweaked his BP regimen prior as well.  We will stop Exforge, will increase Atenolol back to 1 tablet daily of the 25mg , and recheck 49mo.  Hyperlipidemia - given recent several lipid lab results, I think we can back off the dose of Zocor to 10mg  daily instead of 20mg .    Type II diabetes  - discussed diet, recent labs, advised diet changes and no medications warranted currently.  Recheck 32mo.

## 2012-01-27 ENCOUNTER — Telehealth: Payer: Self-pay | Admitting: Medical

## 2012-01-27 NOTE — Telephone Encounter (Signed)
She states that he as hearing aids and still can hear. She wants a referral to an ent. Please call pt.

## 2012-01-28 NOTE — Telephone Encounter (Signed)
i think the msg meant to say he can't hear.  See if they want referral in [place] or [place]?  There are ENT in both areas.

## 2012-01-28 NOTE — Telephone Encounter (Signed)
Pt is scheduled with Dr. Ezzard Standing Monday January 31 2012 at 12:30pm. Address to ENT is [address] [place] [address] Phone # [phone].   Called and left message on vm about appt but to call back for directions if needed them

## 2012-02-06 ENCOUNTER — Inpatient Hospital Stay (HOSPITAL_COMMUNITY)
Admission: EM | Admit: 2012-02-06 | Discharge: 2012-02-10 | DRG: 244 | Disposition: A | Discharge status: Home / Self Care | Payer: Medicare Other | Attending: Internal Medicine | Admitting: Internal Medicine

## 2012-02-06 ENCOUNTER — Emergency Department (HOSPITAL_COMMUNITY): Payer: Medicare Other

## 2012-02-06 ENCOUNTER — Encounter (HOSPITAL_COMMUNITY): Payer: Self-pay | Admitting: Emergency Medicine

## 2012-02-06 DIAGNOSIS — F329 Major depressive disorder, single episode, unspecified: Secondary | ICD-10-CM | POA: Diagnosis present

## 2012-02-06 DIAGNOSIS — IMO0002 Reserved for concepts with insufficient information to code with codable children: Secondary | ICD-10-CM

## 2012-02-06 DIAGNOSIS — R531 Weakness: Secondary | ICD-10-CM

## 2012-02-06 DIAGNOSIS — F3289 Other specified depressive episodes: Secondary | ICD-10-CM | POA: Diagnosis present

## 2012-02-06 DIAGNOSIS — Z9889 Other specified postprocedural states: Secondary | ICD-10-CM

## 2012-02-06 DIAGNOSIS — I10 Essential (primary) hypertension: Secondary | ICD-10-CM | POA: Diagnosis present

## 2012-02-06 DIAGNOSIS — R0602 Shortness of breath: Secondary | ICD-10-CM

## 2012-02-06 DIAGNOSIS — R413 Other amnesia: Secondary | ICD-10-CM | POA: Diagnosis present

## 2012-02-06 DIAGNOSIS — N289 Disorder of kidney and ureter, unspecified: Secondary | ICD-10-CM

## 2012-02-06 DIAGNOSIS — C8590 Non-Hodgkin lymphoma, unspecified, unspecified site: Secondary | ICD-10-CM | POA: Diagnosis present

## 2012-02-06 DIAGNOSIS — I129 Hypertensive chronic kidney disease with stage 1 through stage 4 chronic kidney disease, or unspecified chronic kidney disease: Secondary | ICD-10-CM | POA: Diagnosis present

## 2012-02-06 DIAGNOSIS — M5137 Other intervertebral disc degeneration, lumbosacral region: Secondary | ICD-10-CM | POA: Diagnosis present

## 2012-02-06 DIAGNOSIS — Z85038 Personal history of other malignant neoplasm of large intestine: Secondary | ICD-10-CM

## 2012-02-06 DIAGNOSIS — I4891 Unspecified atrial fibrillation: Secondary | ICD-10-CM

## 2012-02-06 DIAGNOSIS — N401 Enlarged prostate with lower urinary tract symptoms: Secondary | ICD-10-CM | POA: Diagnosis present

## 2012-02-06 DIAGNOSIS — I4430 Unspecified atrioventricular block: Secondary | ICD-10-CM

## 2012-02-06 DIAGNOSIS — M51379 Other intervertebral disc degeneration, lumbosacral region without mention of lumbar back pain or lower extremity pain: Secondary | ICD-10-CM | POA: Diagnosis present

## 2012-02-06 DIAGNOSIS — I495 Sick sinus syndrome: Secondary | ICD-10-CM | POA: Diagnosis present

## 2012-02-06 DIAGNOSIS — N183 Chronic kidney disease, stage 3 unspecified: Secondary | ICD-10-CM | POA: Diagnosis present

## 2012-02-06 DIAGNOSIS — E785 Hyperlipidemia, unspecified: Secondary | ICD-10-CM | POA: Diagnosis present

## 2012-02-06 DIAGNOSIS — I714 Abdominal aortic aneurysm, without rupture: Secondary | ICD-10-CM

## 2012-02-06 DIAGNOSIS — D696 Thrombocytopenia, unspecified: Secondary | ICD-10-CM | POA: Diagnosis present

## 2012-02-06 DIAGNOSIS — Z87891 Personal history of nicotine dependence: Secondary | ICD-10-CM

## 2012-02-06 DIAGNOSIS — I441 Atrioventricular block, second degree: Secondary | ICD-10-CM

## 2012-02-06 DIAGNOSIS — N138 Other obstructive and reflux uropathy: Secondary | ICD-10-CM | POA: Diagnosis present

## 2012-02-06 DIAGNOSIS — Z7982 Long term (current) use of aspirin: Secondary | ICD-10-CM

## 2012-02-06 DIAGNOSIS — I480 Paroxysmal atrial fibrillation: Secondary | ICD-10-CM | POA: Diagnosis not present

## 2012-02-06 HISTORY — DX: Reserved for concepts with insufficient information to code with codable children: IMO0002

## 2012-02-06 HISTORY — DX: Atrioventricular block, second degree: I44.1

## 2012-02-06 HISTORY — DX: Chronic kidney disease, stage 3 unspecified: N18.30

## 2012-02-06 HISTORY — DX: Chronic kidney disease, stage 3 (moderate): N18.3

## 2012-02-06 HISTORY — DX: Other specified postprocedural states: Z98.890

## 2012-02-06 LAB — PROTIME-INR
INR: 1.18 (ref 0.00–1.49)
Prothrombin Time: 15.3 seconds — ABNORMAL HIGH (ref 11.6–15.2)

## 2012-02-06 LAB — CARDIAC PANEL(CRET KIN+CKTOT+MB+TROPI)
CK, MB: 2.2 ng/mL (ref 0.3–4.0)
Total CK: 63 U/L (ref 7–232)
Troponin I: <0.3 ng/mL (ref <0.30)

## 2012-02-06 LAB — COMPREHENSIVE METABOLIC PANEL
AST: 14 U/L (ref 0–37)
Albumin: 3.5 g/dL (ref 3.5–5.2)
Alkaline Phosphatase: 65 U/L (ref 39–117)
Chloride: 104 mEq/L (ref 96–112)
Potassium: 4.4 mEq/L (ref 3.5–5.1)
Total Bilirubin: 0.4 mg/dL (ref 0.3–1.2)
Total Protein: 6.8 g/dL (ref 6.0–8.3)

## 2012-02-06 LAB — CBC
HCT: 32.9 % — ABNORMAL LOW (ref 39.0–52.0)
MCH: 32.6 pg (ref 26.0–34.0)
MCH: 32.9 pg (ref 26.0–34.0)
MCHC: 34.4 g/dL (ref 30.0–36.0)
MCV: 94.8 fL (ref 78.0–100.0)
Platelets: 102 10*3/uL — ABNORMAL LOW (ref 150–400)
Platelets: 113 10*3/uL — ABNORMAL LOW (ref 150–400)
RDW: 14.3 % (ref 11.5–15.5)
RDW: 14 % (ref 11.5–15.5)
WBC: 5.8 10*3/uL (ref 4.0–10.5)

## 2012-02-06 LAB — MRSA PCR SCREENING: MRSA by PCR: NEGATIVE

## 2012-02-06 LAB — URINALYSIS, ROUTINE W REFLEX MICROSCOPIC
Bilirubin Urine: NEGATIVE
Glucose, UA: NEGATIVE mg/dL
Ketones, ur: NEGATIVE mg/dL
Leukocytes, UA: NEGATIVE
Nitrite: NEGATIVE
Specific Gravity, Urine: 1.02 (ref 1.005–1.030)
pH: 5.5 (ref 5.0–8.0)

## 2012-02-06 LAB — DIFFERENTIAL
Basophils Absolute: 0 10*3/uL (ref 0.0–0.1)
Basophils Relative: 1 % (ref 0–1)
Eosinophils Absolute: 0.2 10*3/uL (ref 0.0–0.7)
Neutro Abs: 3.4 10*3/uL (ref 1.7–7.7)
Neutrophils Relative %: 59 % (ref 43–77)

## 2012-02-06 LAB — CREATININE, SERUM: GFR calc Af Amer: 53 mL/min — ABNORMAL LOW (ref >90)

## 2012-02-06 LAB — TROPONIN I: Troponin I: <0.3 ng/mL (ref <0.30)

## 2012-02-06 MED ORDER — NIACIN ER (ANTIHYPERLIPIDEMIC) 500 MG PO TBCR
500.0000 mg | EXTENDED_RELEASE_TABLET | Freq: Every day | ORAL | Status: DC
  Administered 2012-02-06 – 2012-02-09 (×4): 500 mg via ORAL
  Filled 2012-02-06 (×6): qty 1

## 2012-02-06 MED ORDER — ASPIRIN 325 MG PO TABS
325.0000 mg | ORAL_TABLET | Freq: Every day | ORAL | Status: DC
  Administered 2012-02-07 – 2012-02-10 (×4): 325 mg via ORAL
  Filled 2012-02-06 (×4): qty 1

## 2012-02-06 MED ORDER — HYDROCODONE-ACETAMINOPHEN 5-325 MG PO TABS
1.0000 | ORAL_TABLET | ORAL | Status: DC | PRN
  Administered 2012-02-09 – 2012-02-10 (×5): 1 via ORAL
  Filled 2012-02-06 (×5): qty 1

## 2012-02-06 MED ORDER — HEPARIN SODIUM (PORCINE) 5000 UNIT/ML IJ SOLN
5000.0000 [IU] | Freq: Three times a day (TID) | INTRAMUSCULAR | Status: DC
  Administered 2012-02-06 – 2012-02-08 (×5): 5000 [IU] via SUBCUTANEOUS
  Filled 2012-02-06 (×8): qty 1

## 2012-02-06 MED ORDER — ALPRAZOLAM 0.25 MG PO TABS
0.2500 mg | ORAL_TABLET | Freq: Two times a day (BID) | ORAL | Status: DC | PRN
  Administered 2012-02-07 (×2): 0.25 mg via ORAL
  Filled 2012-02-06 (×2): qty 1

## 2012-02-06 MED ORDER — ONDANSETRON HCL 4 MG/2ML IJ SOLN: 4.0000 mg | Freq: Four times a day (QID) | INTRAMUSCULAR | Status: DC | PRN

## 2012-02-06 MED ORDER — BIOTENE DRY MOUTH MT LIQD
15.0000 mL | Freq: Two times a day (BID) | OROMUCOSAL | Status: DC
  Administered 2012-02-07 – 2012-02-10 (×5): 15 mL via OROMUCOSAL

## 2012-02-06 MED ORDER — ESCITALOPRAM OXALATE 20 MG PO TABS
20.0000 mg | ORAL_TABLET | Freq: Every day | ORAL | Status: DC
  Administered 2012-02-07 – 2012-02-10 (×4): 20 mg via ORAL
  Filled 2012-02-06 (×4): qty 1

## 2012-02-06 MED ORDER — ACETAMINOPHEN 325 MG PO TABS: 650.0000 mg | ORAL_TABLET | ORAL | Status: DC | PRN

## 2012-02-06 MED ORDER — NITROGLYCERIN 0.4 MG SL SUBL: 0.4000 mg | SUBLINGUAL_TABLET | SUBLINGUAL | Status: DC | PRN

## 2012-02-06 MED ORDER — SIMVASTATIN 10 MG PO TABS
10.0000 mg | ORAL_TABLET | Freq: Every day | ORAL | Status: DC
  Administered 2012-02-06 – 2012-02-09 (×4): 10 mg via ORAL
  Filled 2012-02-06 (×6): qty 1

## 2012-02-06 MED ORDER — PANTOPRAZOLE SODIUM 40 MG PO TBEC
40.0000 mg | DELAYED_RELEASE_TABLET | Freq: Every day | ORAL | Status: DC
  Administered 2012-02-07 – 2012-02-10 (×4): 40 mg via ORAL
  Filled 2012-02-06 (×5): qty 1

## 2012-02-06 MED ORDER — ASPIRIN EC 81 MG PO TBEC: 81.0000 mg | DELAYED_RELEASE_TABLET | Freq: Every day | ORAL | Status: DC

## 2012-02-06 MED ORDER — SODIUM CHLORIDE 0.9 % IV SOLN
INTRAVENOUS | Status: DC
  Administered 2012-02-06: 20:00:00 via INTRAVENOUS

## 2012-02-06 NOTE — ED Notes (Signed)
Pt with c/o fatigue for few days per wife, pt is HOH, mild nausea, HR at 40 on monitor once pt in room, denies any pain at this time

## 2012-02-06 NOTE — ED Notes (Signed)
Pt c/o weakness and bradycardia since last night.

## 2012-02-06 NOTE — ED Provider Notes (Cosign Needed)
History   This chart was scribed for Ward Givens, MD by Clarita Crane. The patient was seen in room APA02/APA02. Patient's care was started at 1347.    CSN: [id number]  Arrival date & time 02/06/12  1347   First MD Initiated Contact with Patient 02/06/12 1358      Chief Complaint  Patient presents with  . Weakness    (Consider location/radiation/quality/duration/timing/severity/associated sxs/prior treatment) HPI [name] [name] is a 78 y.o. male who presents to the Emergency Department complaining of moderate to severe weakness and fatigue onset 4 days ago and gradually worsening since with associated bradycardia last night, SOB, DOE and pallor. Patient reports that he monitors heart rate via automatic blood pressure monitor. Denies chest pain, melena, abdominal pain, HA, swelling of lower extremities, nausea, vomiting, diarrhea, fever, chills, hematochezia. States he feels better lying down. Patient was recently advised to stop his exforge because of low BP recently as opposed to once daily as previously advised, but his BP started to increase so he takes it every other day. Additionally, patient notes that he has taken a full pill of Atenolol the past 2 days although he was previously taking only a half pill of Atenolol for the past couple of weeks. He had been taking a whole atenolol for a long time. Patient's accompanying automated blood pressure monitor reveals patient with HR measurements of 40 BPM and 38 BPM with associated BP measurements of 116/60 and 154/73 respectively this morning. Previous heart rate measurements include measurements between 55-62 BPM yesterday, 61-65 BPM 2 days ago and 74-80 BPM 3 days ago via the automated blood pressure monitor. Patient with h/o depression, AAA, colon CA, non-hodgkins lymphoma, chronic renal disease, anemia, colostomy.   PCP- Kristian Covey in [place] Cardiologist- [facility] Dr Royann Shivers  Past Medical History  Diagnosis Date   . Depression   . Aneurysm of abdominal aorta     [facility], Dr. Royann Shivers  . History of colon cancer     hospitalization 1999  . Degenerative disc disease, lumbar   . History of non-Hodgkin's lymphoma     hospitalization 2002  . Cataract   . Hard of hearing     wears hearing aids  . Hypertension   . Hyperlipidemia   . Thrombocytopenia     Dr. Darnelle Catalan  . Chronic renal disease   . Memory loss   . Hyperglycemia   . Edema   . Heart murmur     Echocardiogram 7/10, stress test 2008, [facility]  . Overactive bladder   . Anemia     Dr. Darnelle Catalan    Past Surgical History  Procedure Date  . Colostomy     No family history on file.  History  Substance Use Topics  . Smoking status: Never Smoker   . Smokeless tobacco: Never Used  . Alcohol Use: 0.6 oz/week    1 Cans of beer per week  lives at home Lives with spouse Walks unassisted in the house uses cane outside the house  Review of Systems A complete 10 system review of systems was obtained and all systems are negative except as noted in the HPI and PMH.   Allergies  Review of patient's allergies indicates no known allergies.  Home Medications   Current Outpatient Rx  Name Route Sig Dispense Refill  . AMLODIPINE BESYLATE-VALSARTAN 10-320 MG PO TABS Oral Take 1 tablet by mouth daily. 90 tablet 5  . ASPIRIN 325 MG PO TABS Oral Take 325 mg by mouth daily.      Marland Kitchen  ATENOLOL 25 MG PO TABS Oral Take 25 mg by mouth daily.      Marland Kitchen ESCITALOPRAM OXALATE 10 MG PO TABS Oral Take 2 tablets (20 mg total) by mouth daily. 90 tablet 5  . ESCITALOPRAM OXALATE 20 MG PO TABS Oral Take 1 tablet (20 mg total) by mouth daily. 90 tablet 1  . HYDROCODONE-ACETAMINOPHEN 10-650 MG PO TABS  TAKE ONE TABLET BY MOUTH 2 TO 3 TIMES DAILY AS NEEDED 270 tablet 0  . MECLIZINE HCL 25 MG PO TABS  TAKE ONE TABLET BY MOUTH UP TO TWICE DAILY 180 tablet 0  . NIASPAN 500 MG PO TBCR  TAKE TWO TABLETS BY MOUTH AT BEDTIME 60 each 5  . SILODOSIN 8 MG PO CAPS Oral Take 8 mg by  mouth daily.      Marland Kitchen SIMVASTATIN 10 MG PO TABS Oral Take 1 tablet (10 mg total) by mouth at bedtime. 30 tablet 0    Ht 5' 3.5" (1.613 m)  Wt 187 lb (84.823 kg)  BMI 32.61 kg/m2  ED Triage Vitals  Enc Vitals Group     BP 02/06/12 1400 138/44 mmHg     Pulse Rate 02/06/12 1400 51      Resp 02/06/12 1400 10      Temp 02/06/12 1403 97.7 F (36.5 C)     Temp src 02/06/12 1403 Oral     SpO2 02/06/12 1400 98 %     Weight 02/06/12 1354 187 lb (84.823 kg)     Height 02/06/12 1354 5' 3.5" (1.613 m)     Head Cir --      Peak Flow --      Pain Score --      Pain Loc --      Pain Edu? --      Excl. in GC? --    Vital signs normal except bradycardia    Physical Exam  Nursing note and vitals reviewed. Constitutional: He is oriented to person, place, and time. He appears well-developed and well-nourished. No distress.  HENT:  Head: Normocephalic and atraumatic.  Right Ear: External ear normal.  Nose: Nose normal.  Mouth/Throat: Oropharynx is clear and moist.       Hard of hearing, wears hearing aides  Eyes: Conjunctivae and EOM are normal. Pupils are equal, round, and reactive to light.       Scleral pale  Neck: Neck supple. No tracheal deviation present.  Cardiovascular: Regular rhythm.  Bradycardia present.   Murmur (systolic at right upper sternal border) heard. Pulmonary/Chest: Effort normal. No respiratory distress. He has no wheezes. He has no rales.  Abdominal: Soft. Bowel sounds are normal. He exhibits no distension. There is no tenderness.       Colostomy bag present.   Musculoskeletal: Normal range of motion. He exhibits no edema.  Neurological: He is alert and oriented to person, place, and time. No sensory deficit.  Skin: Skin is warm and dry. There is pallor.  Psychiatric: His behavior is normal.       flat    ED Course  Procedures (including critical care time)  Monitor shows heart rate getting into high 30's narrow complex but has good BP.   DIAGNOSTIC  STUDIES: Oxygen Saturation is 100% on room air, normal by my interpretation.    COORDINATION OF CARE: 2:15PM-Patient informed of current plan for treatment and evaluation and agrees with plan at this time. 3:10Pm- consult complete with Cardiology. Patient case explained and discussed.   3:31PM- Patient informed of current  labs and imaging results and intent to transfer to [facility] for further evaluation and treatment.   15:19 D/W Dr Rennis Golden, states Dr Mina Marble will be in [place] tomorrow, stop the atentolol and consult tomorrow  15:25 Dr Rennis Golden, called back, has looked at EKG closer, states he will accept at [facility] to stepdown, feels his EKG shows 2:1 AV block.   Pt and wife informed and are agreeable to transfer.  Results for orders placed during the hospital encounter of 02/06/12  CBC      Component Value Range   WBC 5.8  4.0 - 10.5 K/uL   RBC 3.37 (*) 4.22 - 5.81 MIL/uL   Hemoglobin 11.0 (*) 13.0 - 17.0 g/dL   HCT 16.1 (*) 09.6 - 04.5 %   MCV 95.0  78.0 - 100.0 fL   MCH 32.6  26.0 - 34.0 pg   MCHC 34.4  30.0 - 36.0 g/dL   RDW 40.9  81.1 - 91.4 %   Platelets 102 (*) 150 - 400 K/uL  DIFFERENTIAL      Component Value Range   Neutrophils Relative 59  43 - 77 %   Neutro Abs 3.4  1.7 - 7.7 K/uL   Lymphocytes Relative 28  12 - 46 %   Lymphs Abs 1.6  0.7 - 4.0 K/uL   Monocytes Relative 10  3 - 12 %   Monocytes Absolute 0.6  0.1 - 1.0 K/uL   Eosinophils Relative 3  0 - 5 %   Eosinophils Absolute 0.2  0.0 - 0.7 K/uL   Basophils Relative 1  0 - 1 %   Basophils Absolute 0.0  0.0 - 0.1 K/uL  COMPREHENSIVE METABOLIC PANEL      Component Value Range   Sodium 139  135 - 145 mEq/L   Potassium 4.4  3.5 - 5.1 mEq/L   Chloride 104  96 - 112 mEq/L   CO2 24  19 - 32 mEq/L   Glucose, Bld 128 (*) 70 - 99 mg/dL   BUN 34 (*) 6 - 23 mg/dL   Creatinine, Ser 7.82 (*) 0.50 - 1.35 mg/dL   Calcium 9.7  8.4 - 95.6 mg/dL   Total Protein 6.8  6.0 - 8.3 g/dL   Albumin 3.5  3.5 - 5.2 g/dL   AST 14  0 -  37 U/L   ALT 8  0 - 53 U/L   Alkaline Phosphatase 65  39 - 117 U/L   Total Bilirubin 0.4  0.3 - 1.2 mg/dL   GFR calc non Af Amer 39 (*) >90 mL/min   GFR calc Af Amer 45 (*) >90 mL/min  TROPONIN I      Component Value Range   Troponin I <0.30  <0.30 ng/mL  URINALYSIS, ROUTINE W REFLEX MICROSCOPIC      Component Value Range   Color, Urine YELLOW  YELLOW   APPearance CLEAR  CLEAR   Specific Gravity, Urine 1.020  1.005 - 1.030   pH 5.5  5.0 - 8.0   Glucose, UA NEGATIVE  NEGATIVE mg/dL   Hgb urine dipstick NEGATIVE  NEGATIVE   Bilirubin Urine NEGATIVE  NEGATIVE   Ketones, ur NEGATIVE  NEGATIVE mg/dL   Protein, ur NEGATIVE  NEGATIVE mg/dL   Urobilinogen, UA 0.2  0.0 - 1.0 mg/dL   Nitrite NEGATIVE  NEGATIVE   Leukocytes, UA NEGATIVE  NEGATIVE  PRO B NATRIURETIC PEPTIDE      Component Value Range   Pro B Natriuretic peptide (BNP) 3850.0 (*) 0 -  450 pg/mL   Laboratory interpretation all normal except stable anemia, renal insuffic, elevated BNP   Dg Chest [facility] 1 View  02/06/2012  *RADIOLOGY REPORT*  Clinical Data: Shortness of breath.  Generalized weakness.  History of leukemia.  PORTABLE CHEST - 1 VIEW 02/06/2012 1425 hours:  Comparison: CT chest 06/05/2009, 06/06/2008.  One-view chest x-ray 12/11/2007.  Findings: Cardiac silhouette moderately enlarged for the AP portable technique, unchanged.  Thoracic aorta atherosclerotic, unchanged.  Hilar and mediastinal contours otherwise unremarkable. Lungs clear.  Bronchovascular markings normal.  Pulmonary vascularity normal.  No pneumothorax.  No pleural effusions.  IMPRESSION: No acute cardiopulmonary disease.  Stable moderate cardiomegaly.  Original Report Authenticated By: Arnell Sieving, M.D.     Date: 02/06/2012  Rate: 41  Rhythm: AV dissociationn, Bradycardia  QRS Axis: normal  Intervals: QT prolonged  ST/T Wave abnormalities: normal  Conduction Disutrbances:none  Narrative Interpretation:   Old EKG Reviewed: changes noted from  12/09/2006 was in NSR rate 63  with 1st AVB 230 ms   1. AV block   2. Weakness   3. Shortness of breath   4. Renal insufficiency     Disposition transfer to [facility] for admission by Dr Rennis Golden.    CRITICAL CARE Performed by: Devoria Albe L   Total critical care time: 30 min Critical care time was exclusive of separately billable procedures and treating other patients.  Critical care was necessary to treat or prevent imminent or life-threatening deterioration.  Critical care was time spent personally by me on the following activities: development of treatment plan with patient and/or surrogate as well as nursing, discussions with consultants, evaluation of patient's response to treatment, examination of patient, obtaining history from patient or surrogate, ordering and performing treatments and interventions, ordering and review of laboratory studies, ordering and review of radiographic studies, pulse oximetry and re-evaluation of patient's condition.   MDM   I personally performed the services described in this documentation, which was scribed in my presence. The recorded information has been reviewed and considered.  Devoria Albe, MD, [facility]    Ward Givens, MD 02/06/12 [phone]

## 2012-02-06 NOTE — H&P (Signed)
Pt. Seen and examined. Agree with the NP/PA-C note as written.  78 yo male with a history of 1st degree AVB in the past as well as recent fatigue and hypotension recently necessitating reduction in his BP meds. He recently noted a low pulse in the 20's at night which only came up in to the 30's in the morning. Typically he has a pulse in the 60's.  He is on atenolol and exforge, but he has not been taking the exforge due to hypotension. He is also on sildosin, a peripheral alpha-blocker for his prostate.  EKG initially looks like a long 1st degree AVB with marked sinus bradycardia, however, on the rhythm strip there may be p-waves buried in T-waves that do appear to march out - making it suspect for 2:1 AV block. The QRSD is narrow. Will plan to hold his AVN blockers and monitor overnight. If he has worsening bradycardia, hypotension or new symptoms I would suggest low dose dopamine. Will keep NPO p MN for possible pacemaker. Will have [place] EP see in the am.  Chrystie Nose, MD, [facility] Attending Cardiologist [facility]

## 2012-02-06 NOTE — ED Notes (Signed)
Report given to [facility] with [facility], ETA of 20 minutes

## 2012-02-06 NOTE — ED Notes (Signed)
MD at bedside. 

## 2012-02-06 NOTE — H&P (Signed)
[name] [name] is an 78 y.o. male.    PCP Kristian Covey, PA in [place] Cardiologist:  Dr. Royann Shivers Chief Complaint:  HPI: 78 y.o. male who presents to the Emergency Department complaining of moderate to severe weakness and fatigue onset 4 days ago and gradually worsening since, with associated bradycardia last night, SOB, DOE and pallor. Patient reports that he monitors heart rate via automatic blood pressure monitor. Denies chest pain, melena, abdominal pain, HA, swelling of lower extremities, nausea, vomiting, diarrhea, fever, chills, hematochezia. States he feels better lying down. Patient was recently advised to stop his exforge because of low BP recently as opposed to once daily as previously advised, but his BP started to increase so he takes it every other day. Additionally, patient notes that he has taken a full pill of Atenolol the past 2 days although he was previously taking only a half pill of Atenolol for the past couple of weeks.  Was instructed to decrease to 12.5 mg atenolol daily.  In the ER he was in 2:1 AV Block with HR 38, pt states at home it was 27.  Found his heart rate was low last pm and continued to be low this am.  Cardiac enzymes negative.    Past Medical History  Diagnosis Date  . Depression   . Aneurysm of abdominal aorta     [facility], Dr. Royann Shivers  . History of colon cancer     hospitalization 1999  . Degenerative disc disease, lumbar   . History of non-Hodgkin's lymphoma     hospitalization 2002  . Cataract   . Hard of hearing     wears hearing aids  . Hypertension   . Hyperlipidemia   . Thrombocytopenia     Dr. Darnelle Catalan  . Chronic renal disease   . Memory loss   . Hyperglycemia   . Edema   . Heart murmur     Echocardiogram 7/10, stress test 2008, [facility]  . Overactive bladder   . Anemia     Dr. Darnelle Catalan  . AV block, 2nd degree 02/06/2012  . Lymphoma, history of 02/06/2012  . H/O colostomy 02/06/2012  . History of AAA (abdominal aortic aneurysm) repair  02/06/2012    Past Surgical History  Procedure Date  . Colostomy     History reviewed. No pertinent family history. Social History:  reports that he quit smoking about 44 years ago. He has never used smokeless tobacco. He reports that he drinks about .6 ounces of alcohol per week. He reports that he does not use illicit drugs. He has a male friend that is significant other, a sister in [place] city.  Allergies: No Known Allergies  Medications Prior to Admission  Medication Sig Dispense Refill  . amLODipine-valsartan (EXFORGE) 10-320 MG per tablet Take 1 tablet by mouth every other day. Patient takes a 1/2 tablet every other day      . aspirin 325 MG tablet Take 325 mg by mouth daily.        Marland Kitchen atenolol (TENORMIN) 25 MG tablet Take 25 mg by mouth daily.        Marland Kitchen escitalopram (LEXAPRO) 20 MG tablet Take 1 tablet (20 mg total) by mouth daily.  90 tablet  1  . HYDROcodone-acetaminophen (LORCET) 10-650 MG per tablet Take 1 tablet by mouth daily.      . meclizine (ANTIVERT) 25 MG tablet Take 25 mg by mouth 2 (two) times daily.      Marland Kitchen NIASPAN 500 MG CR tablet TAKE TWO  TABLETS BY MOUTH AT BEDTIME  60 each  5  . Silodosin (RAPAFLO) 8 MG CAPS Take 8 mg by mouth daily.       . simvastatin (ZOCOR) 10 MG tablet Take 1 tablet (10 mg total) by mouth at bedtime.  30 tablet  0  . amLODipine-valsartan (EXFORGE) 10-320 MG per tablet Take 1 tablet by mouth daily.  90 tablet  5   Pt. normally takes 12.5 mg atenolol.  Results for orders placed during the hospital encounter of 02/06/12 (from the past 48 hour(s))  CBC     Status: Abnormal   Collection Time   02/06/12  2:16 PM      Component Value Range Comment   WBC 5.8  4.0 - 10.5 K/uL    RBC 3.37 (*) 4.22 - 5.81 MIL/uL    Hemoglobin 11.0 (*) 13.0 - 17.0 g/dL    HCT 16.1 (*) 09.6 - 52.0 %    MCV 95.0  78.0 - 100.0 fL    MCH 32.6  26.0 - 34.0 pg    MCHC 34.4  30.0 - 36.0 g/dL    RDW 04.5  40.9 - 81.1 %    Platelets 102 (*) 150 - 400 K/uL     DIFFERENTIAL     Status: Normal   Collection Time   02/06/12  2:16 PM      Component Value Range Comment   Neutrophils Relative 59  43 - 77 %    Neutro Abs 3.4  1.7 - 7.7 K/uL    Lymphocytes Relative 28  12 - 46 %    Lymphs Abs 1.6  0.7 - 4.0 K/uL    Monocytes Relative 10  3 - 12 %    Monocytes Absolute 0.6  0.1 - 1.0 K/uL    Eosinophils Relative 3  0 - 5 %    Eosinophils Absolute 0.2  0.0 - 0.7 K/uL    Basophils Relative 1  0 - 1 %    Basophils Absolute 0.0  0.0 - 0.1 K/uL   COMPREHENSIVE METABOLIC PANEL     Status: Abnormal   Collection Time   02/06/12  2:16 PM      Component Value Range Comment   Sodium 139  135 - 145 mEq/L    Potassium 4.4  3.5 - 5.1 mEq/L    Chloride 104  96 - 112 mEq/L    CO2 24  19 - 32 mEq/L    Glucose, Bld 128 (*) 70 - 99 mg/dL    BUN 34 (*) 6 - 23 mg/dL    Creatinine, Ser 9.14 (*) 0.50 - 1.35 mg/dL    Calcium 9.7  8.4 - 78.2 mg/dL    Total Protein 6.8  6.0 - 8.3 g/dL    Albumin 3.5  3.5 - 5.2 g/dL    AST 14  0 - 37 U/L    ALT 8  0 - 53 U/L    Alkaline Phosphatase 65  39 - 117 U/L    Total Bilirubin 0.4  0.3 - 1.2 mg/dL    GFR calc non Af Amer 39 (*) >90 mL/min    GFR calc Af Amer 45 (*) >90 mL/min   TROPONIN I     Status: Normal   Collection Time   02/06/12  2:16 PM      Component Value Range Comment   Troponin I <0.30  <0.30 ng/mL   PRO B NATRIURETIC PEPTIDE     Status: Abnormal   Collection Time  02/06/12  2:16 PM      Component Value Range Comment   Pro B Natriuretic peptide (BNP) 3850.0 (*) 0 - 450 pg/mL   URINALYSIS, ROUTINE W REFLEX MICROSCOPIC     Status: Normal   Collection Time   02/06/12  2:50 PM      Component Value Range Comment   Color, Urine YELLOW  YELLOW    APPearance CLEAR  CLEAR    Specific Gravity, Urine 1.020  1.005 - 1.030    pH 5.5  5.0 - 8.0    Glucose, UA NEGATIVE  NEGATIVE mg/dL    Hgb urine dipstick NEGATIVE  NEGATIVE    Bilirubin Urine NEGATIVE  NEGATIVE    Ketones, ur NEGATIVE  NEGATIVE mg/dL    Protein, ur  NEGATIVE  NEGATIVE mg/dL    Urobilinogen, UA 0.2  0.0 - 1.0 mg/dL    Nitrite NEGATIVE  NEGATIVE    Leukocytes, UA NEGATIVE  NEGATIVE MICROSCOPIC NOT DONE ON URINES WITH NEGATIVE PROTEIN, BLOOD, LEUKOCYTES, NITRITE, OR GLUCOSE <1000 mg/dL.   Dg Chest Port 1 View  02/06/2012  *RADIOLOGY REPORT*  Clinical Data: Shortness of breath.  Generalized weakness.  History of leukemia.  PORTABLE CHEST - 1 VIEW 02/06/2012 1425 hours:  Comparison: CT chest 06/05/2009, 06/06/2008.  One-view chest x-ray 12/11/2007.  Findings: Cardiac silhouette moderately enlarged for the AP portable technique, unchanged.  Thoracic aorta atherosclerotic, unchanged.  Hilar and mediastinal contours otherwise unremarkable. Lungs clear.  Bronchovascular markings normal.  Pulmonary vascularity normal.  No pneumothorax.  No pleural effusions.  IMPRESSION: No acute cardiopulmonary disease.  Stable moderate cardiomegaly.  Original Report Authenticated By: Arnell Sieving, M.D.    ROS: General:no colds or fevers, has had weakness, cleans his house, still drives. Skin:no rashes or ulcers HEENT:wear bil hearing aids, still hard of hearing CV:no chest pain PUL:no SOB GI:no problems with colostomy, no melena GU:no hematuria, no dysuria, + slow flow GN:FAOZHYQ of back pain Neuro:no dizziness or syncope Endo:borderline diabetes, no thyroid issues.   Blood pressure 128/48, pulse 38, temperature 97.8 F (36.6 C), temperature source Oral, resp. rate 17, height 5\' 4"  (1.626 m), weight 84.823 kg (187 lb), SpO2 100.00%. PE: General:alert and oriented MAE, follows commands, pleasant affect, currently no acute distress Skin:warm and dry, brisk capillary refill HEENT:normocephalic, sclera clear, hearing aids in place. Neck:supple, no JVD, no carotid bruits Heart:S1S2 RRR slow, soft systolic murmur. Lungs:clear without rales, rhonchi or wheezes Abd:+ BS soft non tender. Colostomy draining Ext:no edema. Neuro:lert and oriented MAE, follows  commands    Assessment/Plan Patient Active Problem List  Diagnosis  . CANCER, COLORECTAL  . NEOP, MALIGNANT, SKIN NOS  . HYPERLIPIDEMIA  . THROMBOCYTOPENIA  . DEPRESSION, SITUATIONAL  . CATARACT NOS  . HYPERTENSION, though recently has been hypotensive  . OVERACTIVE BLADDER  . BENIGN PROSTATIC HYPERTROPHY, WITH OBSTRUCTION  . DEGENERATIVE DISC DISEASE, LUMBAR SPINE  . LOW BACK PAIN, CHRONIC  . Memory loss  . PERIPHERAL EDEMA  . HYPERGLYCEMIA  . Vertigo, benign paroxysmal  . Depression  . Hypertension  . Chronic back pain  . Hypogonadism male  . Dizziness  . Visual disturbance  . Disequilibrium  . Feeling faint  . Fall  . Tinnitus  . Confusion  . Rectal pressure  . BPH (benign prostatic hyperplasia)  . AV block, 2nd degree  . Lymphoma, history of  . H/O colostomy  . History of AAA (abdominal aortic aneurysm) repair   PLAN: Admitted for weakness and 2:1 AV block with HR in the  30's now.  Will stop atenolol. Monitor, r/o MI.  EP consult in AM   Tania Perrott R 02/06/2012, 6:11 PM

## 2012-02-07 DIAGNOSIS — I4430 Unspecified atrioventricular block: Secondary | ICD-10-CM

## 2012-02-07 LAB — LIPID PANEL
Cholesterol: 83 mg/dL (ref 0–200)
HDL: 49 mg/dL (ref >39)
Total CHOL/HDL Ratio: 1.7 RATIO
VLDL: 17 mg/dL (ref 0–40)

## 2012-02-07 LAB — BASIC METABOLIC PANEL
CO2: 22 mEq/L (ref 19–32)
Calcium: 9.3 mg/dL (ref 8.4–10.5)
Chloride: 108 mEq/L (ref 96–112)
Creatinine, Ser: 1.13 mg/dL (ref 0.50–1.35)
Glucose, Bld: 95 mg/dL (ref 70–99)

## 2012-02-07 LAB — CBC
HCT: 30.3 % — ABNORMAL LOW (ref 39.0–52.0)
Hemoglobin: 10.4 g/dL — ABNORMAL LOW (ref 13.0–17.0)
MCH: 32.1 pg (ref 26.0–34.0)
MCV: 93.5 fL (ref 78.0–100.0)
Platelets: 107 10*3/uL — ABNORMAL LOW (ref 150–400)
RBC: 3.24 MIL/uL — ABNORMAL LOW (ref 4.22–5.81)
WBC: 5.6 10*3/uL (ref 4.0–10.5)

## 2012-02-07 LAB — CARDIAC PANEL(CRET KIN+CKTOT+MB+TROPI)
CK, MB: 2.3 ng/mL (ref 0.3–4.0)
CK, MB: 2 ng/mL (ref 0.3–4.0)
Relative Index: INVALID (ref 0.0–2.5)
Total CK: 68 U/L (ref 7–232)
Troponin I: <0.3 ng/mL (ref <0.30)

## 2012-02-07 MED ORDER — SODIUM CHLORIDE 0.9 % IV SOLN: 250.0000 mL | INTRAVENOUS | Status: DC

## 2012-02-07 MED ORDER — SODIUM CHLORIDE 0.9 % IJ SOLN
3.0000 mL | Freq: Two times a day (BID) | INTRAMUSCULAR | Status: DC
  Administered 2012-02-08: 3 mL via INTRAVENOUS

## 2012-02-07 MED ORDER — CHLORHEXIDINE GLUCONATE 4 % EX LIQD
60.0000 mL | Freq: Once | CUTANEOUS | Status: DC
  Filled 2012-02-07: qty 60

## 2012-02-07 MED ORDER — CEFAZOLIN SODIUM-DEXTROSE 2-3 GM-% IV SOLR
2.0000 g | INTRAVENOUS | Status: DC
  Filled 2012-02-07 (×2): qty 50

## 2012-02-07 MED ORDER — SODIUM CHLORIDE 0.9 % IR SOLN
80.0000 mg | Status: DC
  Filled 2012-02-07 (×2): qty 2

## 2012-02-07 MED ORDER — CHLORHEXIDINE GLUCONATE 4 % EX LIQD
60.0000 mL | Freq: Once | CUTANEOUS | Status: AC
  Administered 2012-02-07: 4 via TOPICAL
  Filled 2012-02-07: qty 60

## 2012-02-07 MED ORDER — SODIUM CHLORIDE 0.9 % IV SOLN
INTRAVENOUS | Status: DC
  Administered 2012-02-07 – 2012-02-08 (×2): via INTRAVENOUS

## 2012-02-07 MED ORDER — HYDRALAZINE HCL 20 MG/ML IJ SOLN
10.0000 mg | Freq: Once | INTRAMUSCULAR | Status: AC
  Administered 2012-02-07: 10 mg via INTRAVENOUS
  Filled 2012-02-07: qty 1

## 2012-02-07 MED ORDER — SODIUM CHLORIDE 0.9 % IJ SOLN: 3.0000 mL | INTRAMUSCULAR | Status: DC | PRN

## 2012-02-07 NOTE — Care Management Note (Signed)
    Page 1 of 1   02/07/2012     12:38:59 PM   CARE MANAGEMENT NOTE 02/07/2012  Patient:  [name] [name], [name] [name]   Account Number:  [id number]  Date Initiated:  02/07/2012  Documentation initiated by:  Junius Creamer  Subjective/Objective Assessment:   adm w 2nd deg av block     Action/Plan:   lives w wife, pcp dr Sharlot Gowda   Anticipated DC Date:     Anticipated DC Plan:        DC Planning Services  CM consult      Choice offered to / List presented to:             Status of service:   Medicare Important Message given?   (If response is "NO", the following Medicare IM given date fields will be blank) Date Medicare IM given:   Date Additional Medicare IM given:    Discharge Disposition:    Per UR Regulation:  Reviewed for med. necessity/level of care/duration of stay  If discussed at Long Length of Stay Meetings, dates discussed:    Comments:  6/24 12:38p debbie Tonjua Rossetti rn,bsn [phone]

## 2012-02-07 NOTE — Progress Notes (Signed)
Subjective: No pain, no complaints except hungry  Objective: Vital signs in last 24 hours: Temp:  [97.7 F (36.5 C)-98.1 F (36.7 C)] 98.1 F (36.7 C) (06/24 0347) Pulse Rate:  [36-51] 45  (06/24 0700) Resp:  [10-22] 17  (06/24 0700) BP: (91-139)/(43-64) 132/48 mmHg (06/24 0700) SpO2:  [97 %-100 %] 99 % (06/24 0700) Weight:  [84.823 kg (187 lb)] 84.823 kg (187 lb) (06/23 1354) Weight change:    Intake/Output from previous day: -920 06/23 0701 - 06/24 0700 In: 130 [I.V.:130] Out: 1050 [Urine:1050] Intake/Output this shift:    PE: General:alert and oriented, hungry. Heart:S1S2 RRR with 2/6 systolic murmur Lungs:clear, no rales Abd:+ BS soft, non tender Ext:no edema Neuro:alert oriented   Lab Results:  Basename 02/07/12 0509 02/06/12 1855  WBC 5.6 5.8  HGB 10.4* 11.4*  HCT 30.3* 32.9*  PLT 107* 113*   BMET  Basename 02/07/12 0509 02/06/12 1855 02/06/12 1416  NA 144 -- 139  K 4.1 -- 4.4  CL 108 -- 104  CO2 22 -- 24  GLUCOSE 95 -- 128*  BUN 27* -- 34*  CREATININE 1.13 1.34 --  CALCIUM 9.3 -- 9.7    Basename 02/07/12 0509 02/07/12 0054  TROPONINI <0.30 <0.30    Lab Results  Component Value Date   CHOL 83 02/07/2012   HDL 49 02/07/2012   LDLCALC 17 02/07/2012   TRIG 86 02/07/2012   CHOLHDL 1.7 02/07/2012   Lab Results  Component Value Date   HGBA1C 6.3* 02/06/2012     Lab Results  Component Value Date   TSH 5.619* 02/06/2012    Hepatic Function Panel  Basename 02/06/12 1416  PROT 6.8  ALBUMIN 3.5  AST 14  ALT 8  ALKPHOS 65  BILITOT 0.4  BILIDIR --  IBILI --    Basename 02/07/12 0509  CHOL 83   No results found for this basename: PROTIME in the last 72 hours  Studies/Results: Dg Chest Port 1 View  02/06/2012  *RADIOLOGY REPORT*  Clinical Data: Shortness of breath.  Generalized weakness.  History of leukemia.  PORTABLE CHEST - 1 VIEW 02/06/2012 1425 hours:  Comparison: CT chest 06/05/2009, 06/06/2008.  One-view chest x-ray 12/11/2007.   Findings: Cardiac silhouette moderately enlarged for the AP portable technique, unchanged.  Thoracic aorta atherosclerotic, unchanged.  Hilar and mediastinal contours otherwise unremarkable. Lungs clear.  Bronchovascular markings normal.  Pulmonary vascularity normal.  No pneumothorax.  No pleural effusions.  IMPRESSION: No acute cardiopulmonary disease.  Stable moderate cardiomegaly.  Original Report Authenticated By: Arnell Sieving, M.D.    Medications: I have seen and reviewed meds.    Marland Kitchen antiseptic oral rinse  15 mL Mouth Rinse BID  . aspirin  325 mg Oral Daily  . escitalopram  20 mg Oral Daily  . heparin  5,000 Units Subcutaneous Q8H  . niacin  500 mg Oral QHS  . pantoprazole  40 mg Oral Q1200  . simvastatin  10 mg Oral QHS  . DISCONTD: aspirin EC  81 mg Oral Daily   Assessment/Plan: Principal Problem:  *AV block, 2nd degree Active Problems:  HYPERTENSION, though recently has been hypotensive  CKD (chronic kidney disease) stage 3, GFR 30-59 ml/min  HYPERLIPIDEMIA  THROMBOCYTOPENIA  BENIGN PROSTATIC HYPERTROPHY, WITH OBSTRUCTION  Memory loss  H/O colostomy  History of AAA (abdominal aortic aneurysm) repair  Lymphoma, history of   PLAN:  HR has improved.  Negative MI, Will ask EP to see for further eval., They have been called. Will allow clear liquids  this am.  LOS: 1 day   INGOLD,LAURA R 02/07/2012, 8:25 AM  I have seen & examined the patient this AM & have reviewed the chart.  I agree with Corliss Blacker note including the findings, examination and recommendation.   HR is somewhat improved & BP is better, but remains bradycardic.  Doing fine with no BP medications.  Will wait until PPM placed to re-institute Rx. My review of ECG from today would agree with 2:1 AVB with junctional escape rhythm.   Has been off BB o/n.   I agree with EP consultation to evaluate for possible PPM placement for symptomatic bradycardia & high grade AVB.   Unlikely that he will be taken  for PPM today, but agree with Clear liquids today just in case.   If no PPM, can resume diet.    Actually seen by EP this AM - plan if PPM tomorrow.  Can have full diet.  Marykay Lex, M.D., M.S. [facility] [address] [place], [place]  [address]  [phone] Pager # [phone]  02/07/2012 9:47 AM

## 2012-02-07 NOTE — Consult Note (Signed)
ELECTROPHYSIOLOGY CONSULT NOTE    Patient ID: [name] [name] MRN: [id number], DOB/AGE: 76/02/1922 76 y.o.  Admit date: 02/06/2012 Date of Consult: 02-07-2012  Primary Physician: Carollee Herter, MD Primary Cardiologist: Octavio Graves, MD  Reason for Consultation: heart block  HPI: [name] [name] is a 78 year old male with a history of colon cancer, hypertension, chronic renal insufficiency, and lymphoma.  Yesterday, he awoke and noticed that he was "giddy headed".  He took his pulse and noted it to be low.  He went to [facility] where an EKG demonstrated high grade heart block and he was transferred to [facility] for further evaluation.  He has not had any syncope, chest pain, or shortness of breath.  He has chronic exercise intolerance dating back to his colostomy and lymphoma treatment.    Home medications include Atenolol and Exforge.  The patient reports this morning that he has taken both of these medications up until yesterday morning.   Echo results have been requested from [facility].  EP has been asked to evaluate for pacemaker placement.   Past Medical History  Diagnosis Date  . Depression   . Aneurysm of abdominal aorta     [facility], Dr. Royann Shivers  . History of colon cancer     hospitalization 1999  . Degenerative disc disease, lumbar   . History of non-Hodgkin's lymphoma     hospitalization 2002  . Cataract   . Hard of hearing     wears hearing aids  . Hypertension   . Hyperlipidemia   . Thrombocytopenia     Dr. Darnelle Catalan  . Chronic renal disease   . Memory loss   . Hyperglycemia   . Edema   . Heart murmur     Echocardiogram 7/10, stress test 2008, [facility]  . Overactive bladder   . Anemia     Dr. Darnelle Catalan  . AV block, 2nd degree 02/06/2012  . Lymphoma, history of 02/06/2012  . H/O colostomy 02/06/2012  . History of AAA (abdominal aortic aneurysm) repair 02/06/2012  . CKD (chronic kidney disease) stage 3, GFR 30-59 ml/min 02/06/2012    Surgical History:    Past Surgical History  Procedure Date  . Colostomy     Prescriptions prior to admission  Medication Sig Dispense Refill  . amLODipine-valsartan (EXFORGE) 10-320 MG per tablet Take 1 tablet by mouth every other day. Patient takes a 1/2 tablet every other day      . aspirin 325 MG tablet Take 325 mg by mouth daily.        Marland Kitchen atenolol (TENORMIN) 25 MG tablet Take 25 mg by mouth daily.        Marland Kitchen escitalopram (LEXAPRO) 20 MG tablet Take 1 tablet (20 mg total) by mouth daily.  90 tablet  1  . HYDROcodone-acetaminophen (LORCET) 10-650 MG per tablet Take 1 tablet by mouth daily.      . meclizine (ANTIVERT) 25 MG tablet Take 25 mg by mouth 2 (two) times daily.      Marland Kitchen NIASPAN 500 MG CR tablet TAKE TWO TABLETS BY MOUTH AT BEDTIME  60 each  5  . Silodosin (RAPAFLO) 8 MG CAPS Take 8 mg by mouth daily.       . simvastatin (ZOCOR) 10 MG tablet Take 1 tablet (10 mg total) by mouth at bedtime.  30 tablet  0   Inpatient Medications:    . antiseptic oral rinse  15 mL Mouth Rinse BID  . aspirin  325 mg Oral Daily  .  escitalopram  20 mg Oral Daily  . heparin  5,000 Units Subcutaneous Q8H  . niacin  500 mg Oral QHS  . pantoprazole  40 mg Oral Q1200  . simvastatin  10 mg Oral QHS   Allergies: No Known Allergies  History   Social History  . Marital Status: Widowed    Spouse Name: N/A    Number of Children: N/A  . Years of Education: N/A   Occupational History  . Not on file.   Social History Main Topics  . Smoking status: Former Smoker    Quit date: 08/17/1967  . Smokeless tobacco: Never Used  . Alcohol Use: 0.6 oz/week    1 Cans of beer per week  . Drug Use: No  . Sexually Active: Not on file     married; retired [employer]; lives with Maryan Rued   Other Topics Concern  . Not on file   Social History Narrative  . No narrative on file    History reviewed. No pertinent family history.   Alert and oriented in no acute distress HENT- normal; hearing aids in place Eyes- EOMI,  without scleral icterus Skin- warm and dry; without rashes LN-neg Neck- supple without thyromegaly, cannon a waves, carotids brisk and full without bruits Back-without CVAT or kyphosis Lungs-clear to auscultation CV-slow and irregular rate and rhythm, quiet  S1 and S2, 2/6  murmurs   S4-absent Abd-soft with active bowel sounds; no midline pulsation or hepatomegaly Pulses-intact femoral and distal MKS-without gross deformity Neuro- Ax O, CN3-12 intact, grossly normal motor and sensory function Affect engaging  \ Labs:   Lab Results  Component Value Date   WBC 5.6 02/07/2012   HGB 10.4* 02/07/2012   HCT 30.3* 02/07/2012   MCV 93.5 02/07/2012   PLT 107* 02/07/2012    Lab 02/07/12 0509 02/06/12 1416  NA 144 --  K 4.1 --  CL 108 --  CO2 22 --  BUN 27* --  CREATININE 1.13 --  CALCIUM 9.3 --  PROT -- 6.8  BILITOT -- 0.4  ALKPHOS -- 65  ALT -- 8  AST -- 14  GLUCOSE 95 --   Lab Results  Component Value Date   CKTOTAL 68 02/07/2012   CKMB 2.0 02/07/2012   TROPONINI <0.30 02/07/2012   Lab Results  Component Value Date   CHOL 83 02/07/2012   CHOL 101 12/27/2011   CHOL 105 01/18/2011   Lab Results  Component Value Date   HDL 49 02/07/2012   HDL 43 1/91/4782   HDL 56 04/20/6212   Lab Results  Component Value Date   LDLCALC 17 02/07/2012   LDLCALC 32 12/27/2011   LDLCALC 39 01/18/2011   Lab Results  Component Value Date   TRIG 86 02/07/2012   TRIG 131 12/27/2011   TRIG 52 01/18/2011   Lab Results  Component Value Date   CHOLHDL 1.7 02/07/2012   CHOLHDL 2.3 12/27/2011   CHOLHDL 1.9 01/18/2011   TSH-5.619  Radiology/Studies: Dg Chest Port 1 View 02/06/2012  *RADIOLOGY REPORT*  Clinical Data: Shortness of breath.  Generalized weakness.  History of leukemia.  PORTABLE CHEST - 1 VIEW 02/06/2012 1425 hours:  Comparison: CT chest 06/05/2009, 06/06/2008.  One-view chest x-ray 12/11/2007.  Findings: Cardiac silhouette moderately enlarged for the AP portable technique, unchanged.  Thoracic  aorta atherosclerotic, unchanged.  Hilar and mediastinal contours otherwise unremarkable. Lungs clear.  Bronchovascular markings normal.  Pulmonary vascularity normal.  No pneumothorax.  No pleural effusions.  IMPRESSION: No acute cardiopulmonary disease.  Stable moderate cardiomegaly.  Original Report Authenticated By: Arnell Sieving, M.D.   ZOX:[id number] heart block with narrow QRS  TELEMETRY: high grade heart block with predominately 2:1 heart block  Assessment/Impression  Pt with symptomatic heart block on long standing betablocker and amlodipine therapy  We will hold and watch for total 48 hrs ( though tomorrow) and if still w heart block proceed with pacing for relief of symptoms He is a cancer survivor and gets annual MRI so this issue will need to be clarified prior to device implantation The benefits and risks were reviewed including but not limited to death,  perforation, infection, lead dislodgement and device malfunction.  The patient understands agrees and is willing to proceed.  Sherryl Manges, MD 02/07/2012 1:42 PM]

## 2012-02-08 ENCOUNTER — Encounter (HOSPITAL_COMMUNITY)
Admission: EM | Disposition: A | Discharge status: Home / Self Care | Payer: Self-pay | Source: Home / Self Care | Attending: Internal Medicine

## 2012-02-08 DIAGNOSIS — I4891 Unspecified atrial fibrillation: Secondary | ICD-10-CM

## 2012-02-08 DIAGNOSIS — I480 Paroxysmal atrial fibrillation: Secondary | ICD-10-CM | POA: Diagnosis not present

## 2012-02-08 DIAGNOSIS — Z950 Presence of cardiac pacemaker: Secondary | ICD-10-CM

## 2012-02-08 DIAGNOSIS — I441 Atrioventricular block, second degree: Secondary | ICD-10-CM

## 2012-02-08 HISTORY — DX: Presence of cardiac pacemaker: Z95.0

## 2012-02-08 HISTORY — PX: PACEMAKER PLACEMENT: SHX43

## 2012-02-08 HISTORY — PX: PERMANENT PACEMAKER INSERTION: SHX5480

## 2012-02-08 LAB — GLUCOSE, CAPILLARY: Glucose-Capillary: 130 mg/dL — ABNORMAL HIGH (ref 70–99)

## 2012-02-08 SURGERY — PERMANENT PACEMAKER INSERTION
Anesthesia: LOCAL

## 2012-02-08 MED ORDER — SODIUM CHLORIDE 0.9 % IJ SOLN
3.0000 mL | Freq: Two times a day (BID) | INTRAMUSCULAR | Status: DC
  Administered 2012-02-08: 3 mL via INTRAVENOUS

## 2012-02-08 MED ORDER — CEFAZOLIN SODIUM 1-5 GM-% IV SOLN
1.0000 g | Freq: Four times a day (QID) | INTRAVENOUS | Status: AC
  Administered 2012-02-08 – 2012-02-09 (×3): 1 g via INTRAVENOUS
  Filled 2012-02-08 (×3): qty 50

## 2012-02-08 MED ORDER — SODIUM CHLORIDE 0.9 % IJ SOLN: 3.0000 mL | INTRAMUSCULAR | Status: DC | PRN

## 2012-02-08 MED ORDER — LIDOCAINE HCL (PF) 1 % IJ SOLN
INTRAMUSCULAR | Status: AC
  Filled 2012-02-08: qty 60

## 2012-02-08 MED ORDER — HEPARIN (PORCINE) IN NACL 2-0.9 UNIT/ML-% IJ SOLN
INTRAMUSCULAR | Status: AC
  Filled 2012-02-08: qty 1000

## 2012-02-08 MED ORDER — IRBESARTAN 300 MG PO TABS
300.0000 mg | ORAL_TABLET | Freq: Every day | ORAL | Status: DC
  Administered 2012-02-08 – 2012-02-09 (×2): 300 mg via ORAL
  Filled 2012-02-08 (×4): qty 1

## 2012-02-08 MED ORDER — ATENOLOL 25 MG PO TABS
25.0000 mg | ORAL_TABLET | Freq: Every day | ORAL | Status: DC
  Administered 2012-02-08 – 2012-02-10 (×3): 25 mg via ORAL
  Filled 2012-02-08 (×3): qty 1

## 2012-02-08 MED ORDER — AMLODIPINE BESYLATE 10 MG PO TABS
10.0000 mg | ORAL_TABLET | Freq: Every day | ORAL | Status: DC
  Administered 2012-02-08 – 2012-02-10 (×3): 10 mg via ORAL
  Filled 2012-02-08 (×3): qty 1

## 2012-02-08 NOTE — Interval H&P Note (Signed)
History and Physical Interval Note:  02/08/2012 12:21 PM  [name] [name]  has presented today for surgery, with the diagnosis of bradicardia  The various methods of treatment have been discussed with the patient and family. After consideration of risks, benefits and other options for treatment, the patient has consented to  Procedure(s) (LRB): PERMANENT PACEMAKER INSERTION (N/A) as a surgical intervention .  The patient's history has been reviewed, patient examined, no change in status, stable for surgery.  I have reviewed the patients' chart and labs.  Questions were answered to the patient's satisfaction.     Hillis Range

## 2012-02-08 NOTE — Progress Notes (Signed)
[facility]  DAILY PROGRESS NOTE   Subjective:  He is very sleepy. HR in the 90's .Marland Kitchen Seems regular, however, there are periods of irregularity which may be atrial fibrillation. HR is variable when sitting up and goes up into the low 100's.  Objective:  Temp:  [97.2 F (36.2 C)-98.3 F (36.8 C)] 97.9 F (36.6 C) (06/25 0745) Pulse Rate:  [45-73] 63  (06/25 0745) Resp:  [14-21] 14  (06/25 0745) BP: (113-172)/(48-87) 151/83 mmHg (06/25 0745) SpO2:  [87 %-100 %] 98 % (06/25 0745) Weight change:   Intake/Output from previous day: 06/24 0701 - 06/25 0700 In: 1020 [P.O.:600; I.V.:420] Out: 1900 [Urine:1900]  Intake/Output from this shift:    Medications: Current Facility-Administered Medications  Medication Dose Route Frequency Provider Last Rate Last Dose  . 0.9 %  sodium chloride infusion   Intravenous Continuous Nada Boozer, NP 10 mL/hr at 02/06/12 1952    . 0.9 %  sodium chloride infusion   Intravenous Continuous Duke Salvia, MD 50 mL/hr at 02/07/12 2325    . 0.9 %  sodium chloride infusion  250 mL Intravenous Continuous Duke Salvia, MD      . acetaminophen (TYLENOL) tablet 650 mg  650 mg Oral Q4H PRN Nada Boozer, NP      . ALPRAZolam Prudy Feeler) tablet 0.25 mg  0.25 mg Oral BID PRN Nada Boozer, NP   0.25 mg at 02/07/12 1153  . antiseptic oral rinse (BIOTENE) solution 15 mL  15 mL Mouth Rinse BID Chrystie Nose, MD   15 mL at 02/07/12 0800  . aspirin tablet 325 mg  325 mg Oral Daily Nada Boozer, NP   325 mg at 02/07/12 1005  . ceFAZolin (ANCEF) IVPB 2 g/50 mL premix  2 g Intravenous On Call Duke Salvia, MD      . chlorhexidine (HIBICLENS) 4 % liquid 4 application  60 mL Topical Once Duke Salvia, MD   4 application at 02/07/12 2249  . chlorhexidine (HIBICLENS) 4 % liquid 4 application  60 mL Topical Once Duke Salvia, MD      . escitalopram (LEXAPRO) tablet 20 mg  20 mg Oral Daily Nada Boozer, NP   20 mg at 02/07/12 1005  . gentamicin  (GARAMYCIN) 80 mg in sodium chloride irrigation 0.9 % 500 mL irrigation  80 mg Irrigation On Call Duke Salvia, MD      . hydrALAZINE (APRESOLINE) injection 10 mg  10 mg Intravenous Once Nada Boozer, NP   10 mg at 02/07/12 2323  . HYDROcodone-acetaminophen (NORCO) 5-325 MG per tablet 1 tablet  1 tablet Oral Q4H PRN Nada Boozer, NP      . niacin (NIASPAN) CR tablet 500 mg  500 mg Oral QHS Nada Boozer, NP   500 mg at 02/07/12 2232  . nitroGLYCERIN (NITROSTAT) SL tablet 0.4 mg  0.4 mg Sublingual Q5 Min x 3 PRN Nada Boozer, NP      . ondansetron [facility]) injection 4 mg  4 mg Intravenous Q6H PRN Nada Boozer, NP      . pantoprazole (PROTONIX) EC tablet 40 mg  40 mg Oral Q1200 Nada Boozer, NP   40 mg at 02/07/12 1120  . simvastatin (ZOCOR) tablet 10 mg  10 mg Oral QHS Nada Boozer, NP   10 mg at 02/07/12 2232  . sodium chloride 0.9 % injection 3 mL  3 mL Intravenous Q12H Duke Salvia, MD      . sodium chloride  0.9 % injection 3 mL  3 mL Intravenous PRN Duke Salvia, MD      . DISCONTD: heparin injection 5,000 Units  5,000 Units Subcutaneous Q8H Nada Boozer, NP   5,000 Units at 02/08/12 1610    Physical Exam: General appearance: alert and no distress Neck: no adenopathy, no carotid bruit, no JVD, supple, symmetrical, trachea midline and thyroid not enlarged, symmetric, no tenderness/mass/nodules Lungs: clear to auscultation bilaterally Heart: regular rate and rhythm, S1, S2 normal, no murmur, click, rub or gallop, 2/6 SEM at RUSB Abdomen: soft, non-tender; bowel sounds normal; no masses,  no organomegaly Extremities: extremities normal, atraumatic, no cyanosis or edema Pulses: 2+ and symmetric  Lab Results: Results for orders placed during the hospital encounter of 02/06/12 (from the past 48 hour(s))  CBC     Status: Abnormal   Collection Time   02/06/12  2:16 PM      Component Value Range Comment   WBC 5.8  4.0 - 10.5 K/uL    RBC 3.37 (*) 4.22 - 5.81 MIL/uL    Hemoglobin 11.0 (*)  13.0 - 17.0 g/dL    HCT 96.0 (*) 45.4 - 52.0 %    MCV 95.0  78.0 - 100.0 fL    MCH 32.6  26.0 - 34.0 pg    MCHC 34.4  30.0 - 36.0 g/dL    RDW 09.8  11.9 - 14.7 %    Platelets 102 (*) 150 - 400 K/uL   DIFFERENTIAL     Status: Normal   Collection Time   02/06/12  2:16 PM      Component Value Range Comment   Neutrophils Relative 59  43 - 77 %    Neutro Abs 3.4  1.7 - 7.7 K/uL    Lymphocytes Relative 28  12 - 46 %    Lymphs Abs 1.6  0.7 - 4.0 K/uL    Monocytes Relative 10  3 - 12 %    Monocytes Absolute 0.6  0.1 - 1.0 K/uL    Eosinophils Relative 3  0 - 5 %    Eosinophils Absolute 0.2  0.0 - 0.7 K/uL    Basophils Relative 1  0 - 1 %    Basophils Absolute 0.0  0.0 - 0.1 K/uL   COMPREHENSIVE METABOLIC PANEL     Status: Abnormal   Collection Time   02/06/12  2:16 PM      Component Value Range Comment   Sodium 139  135 - 145 mEq/L    Potassium 4.4  3.5 - 5.1 mEq/L    Chloride 104  96 - 112 mEq/L    CO2 24  19 - 32 mEq/L    Glucose, Bld 128 (*) 70 - 99 mg/dL    BUN 34 (*) 6 - 23 mg/dL    Creatinine, Ser 8.29 (*) 0.50 - 1.35 mg/dL    Calcium 9.7  8.4 - 56.2 mg/dL    Total Protein 6.8  6.0 - 8.3 g/dL    Albumin 3.5  3.5 - 5.2 g/dL    AST 14  0 - 37 U/L    ALT 8  0 - 53 U/L    Alkaline Phosphatase 65  39 - 117 U/L    Total Bilirubin 0.4  0.3 - 1.2 mg/dL    GFR calc non Af Amer 39 (*) >90 mL/min    GFR calc Af Amer 45 (*) >90 mL/min   TROPONIN I     Status: Normal   Collection Time  02/06/12  2:16 PM      Component Value Range Comment   Troponin I <0.30  <0.30 ng/mL   PRO B NATRIURETIC PEPTIDE     Status: Abnormal   Collection Time   02/06/12  2:16 PM      Component Value Range Comment   Pro B Natriuretic peptide (BNP) 3850.0 (*) 0 - 450 pg/mL   URINALYSIS, ROUTINE W REFLEX MICROSCOPIC     Status: Normal   Collection Time   02/06/12  2:50 PM      Component Value Range Comment   Color, Urine YELLOW  YELLOW    APPearance CLEAR  CLEAR    Specific Gravity, Urine 1.020  1.005 -  1.030    pH 5.5  5.0 - 8.0    Glucose, UA NEGATIVE  NEGATIVE mg/dL    Hgb urine dipstick NEGATIVE  NEGATIVE    Bilirubin Urine NEGATIVE  NEGATIVE    Ketones, ur NEGATIVE  NEGATIVE mg/dL    Protein, ur NEGATIVE  NEGATIVE mg/dL    Urobilinogen, UA 0.2  0.0 - 1.0 mg/dL    Nitrite NEGATIVE  NEGATIVE    Leukocytes, UA NEGATIVE  NEGATIVE MICROSCOPIC NOT DONE ON URINES WITH NEGATIVE PROTEIN, BLOOD, LEUKOCYTES, NITRITE, OR GLUCOSE <1000 mg/dL.  MRSA PCR SCREENING     Status: Normal   Collection Time   02/06/12  5:26 PM      Component Value Range Comment   MRSA by PCR NEGATIVE  NEGATIVE   CARDIAC PANEL(CRET KIN+CKTOT+MB+TROPI)     Status: Normal   Collection Time   02/06/12  6:55 PM      Component Value Range Comment   Total CK 63  7 - 232 U/L    CK, MB 2.2  0.3 - 4.0 ng/mL    Troponin I <0.30  <0.30 ng/mL    Relative Index RELATIVE INDEX IS INVALID  0.0 - 2.5   PROTIME-INR     Status: Abnormal   Collection Time   02/06/12  6:55 PM      Component Value Range Comment   Prothrombin Time 15.3 (*) 11.6 - 15.2 seconds    INR 1.18  0.00 - 1.49   APTT     Status: Abnormal   Collection Time   02/06/12  6:55 PM      Component Value Range Comment   aPTT 38 (*) 24 - 37 seconds   TSH     Status: Abnormal   Collection Time   02/06/12  6:55 PM      Component Value Range Comment   TSH 5.619 (*) 0.350 - 4.500 uIU/mL   MAGNESIUM     Status: Normal   Collection Time   02/06/12  6:55 PM      Component Value Range Comment   Magnesium 2.0  1.5 - 2.5 mg/dL   HEMOGLOBIN Z6X     Status: Abnormal   Collection Time   02/06/12  6:55 PM      Component Value Range Comment   Hemoglobin A1C 6.3 (*) <5.7 %    Mean Plasma Glucose 134 (*) <117 mg/dL   CBC     Status: Abnormal   Collection Time   02/06/12  6:55 PM      Component Value Range Comment   WBC 5.8  4.0 - 10.5 K/uL    RBC 3.47 (*) 4.22 - 5.81 MIL/uL    Hemoglobin 11.4 (*) 13.0 - 17.0 g/dL    HCT 09.6 (*) 04.5 - 52.0 %  MCV 94.8  78.0 - 100.0 fL     MCH 32.9  26.0 - 34.0 pg    MCHC 34.7  30.0 - 36.0 g/dL    RDW 16.1  09.6 - 04.5 %    Platelets 113 (*) 150 - 400 K/uL   CREATININE, SERUM     Status: Abnormal   Collection Time   02/06/12  6:55 PM      Component Value Range Comment   Creatinine, Ser 1.34  0.50 - 1.35 mg/dL    GFR calc non Af Amer 46 (*) >90 mL/min    GFR calc Af Amer 53 (*) >90 mL/min   CARDIAC PANEL(CRET KIN+CKTOT+MB+TROPI)     Status: Normal   Collection Time   02/07/12 12:54 AM      Component Value Range Comment   Total CK 97  7 - 232 U/L    CK, MB 2.3  0.3 - 4.0 ng/mL    Troponin I <0.30  <0.30 ng/mL    Relative Index RELATIVE INDEX IS INVALID  0.0 - 2.5   CARDIAC PANEL(CRET KIN+CKTOT+MB+TROPI)     Status: Normal   Collection Time   02/07/12  5:09 AM      Component Value Range Comment   Total CK 68  7 - 232 U/L    CK, MB 2.0  0.3 - 4.0 ng/mL    Troponin I <0.30  <0.30 ng/mL    Relative Index RELATIVE INDEX IS INVALID  0.0 - 2.5   CBC     Status: Abnormal   Collection Time   02/07/12  5:09 AM      Component Value Range Comment   WBC 5.6  4.0 - 10.5 K/uL    RBC 3.24 (*) 4.22 - 5.81 MIL/uL    Hemoglobin 10.4 (*) 13.0 - 17.0 g/dL    HCT 40.9 (*) 81.1 - 52.0 %    MCV 93.5  78.0 - 100.0 fL    MCH 32.1  26.0 - 34.0 pg    MCHC 34.3  30.0 - 36.0 g/dL    RDW 91.4  78.2 - 95.6 %    Platelets 107 (*) 150 - 400 K/uL PLATELET COUNT CONFIRMED BY SMEAR  BASIC METABOLIC PANEL     Status: Abnormal   Collection Time   02/07/12  5:09 AM      Component Value Range Comment   Sodium 144  135 - 145 mEq/L    Potassium 4.1  3.5 - 5.1 mEq/L    Chloride 108  96 - 112 mEq/L    CO2 22  19 - 32 mEq/L    Glucose, Bld 95  70 - 99 mg/dL    BUN 27 (*) 6 - 23 mg/dL    Creatinine, Ser 2.13  0.50 - 1.35 mg/dL    Calcium 9.3  8.4 - 08.6 mg/dL    GFR calc non Af Amer 56 (*) >90 mL/min    GFR calc Af Amer 65 (*) >90 mL/min   LIPID PANEL     Status: Normal   Collection Time   02/07/12  5:09 AM      Component Value Range Comment    Cholesterol 83  0 - 200 mg/dL    Triglycerides 86  <578 mg/dL    HDL 49  >46 mg/dL    Total CHOL/HDL Ratio 1.7      VLDL 17  0 - 40 mg/dL    LDL Cholesterol 17  0 - 99 mg/dL     Imaging:  Dg Chest Port 1 View  02/06/2012  *RADIOLOGY REPORT*  Clinical Data: Shortness of breath.  Generalized weakness.  History of leukemia.  PORTABLE CHEST - 1 VIEW 02/06/2012 1425 hours:  Comparison: CT chest 06/05/2009, 06/06/2008.  One-view chest x-ray 12/11/2007.  Findings: Cardiac silhouette moderately enlarged for the AP portable technique, unchanged.  Thoracic aorta atherosclerotic, unchanged.  Hilar and mediastinal contours otherwise unremarkable. Lungs clear.  Bronchovascular markings normal.  Pulmonary vascularity normal.  No pneumothorax.  No pleural effusions.  IMPRESSION: No acute cardiopulmonary disease.  Stable moderate cardiomegaly.  Original Report Authenticated By: Arnell Sieving, M.D.    Assessment:  1. Principal Problem: 2.  *AV block, 2nd degree 3. Active Problems: 4.  HYPERLIPIDEMIA 5.  THROMBOCYTOPENIA 6.  HYPERTENSION, though recently has been hypotensive 7.  BENIGN PROSTATIC HYPERTROPHY, WITH OBSTRUCTION 8.  Memory loss 9.  Lymphoma, history of 10.  H/O colostomy 11.  History of AAA (abdominal aortic aneurysm) repair 12.  CKD (chronic kidney disease) stage 3, GFR 30-59 ml/min 13.  A-fib 14.   Plan:  1. Bradycardia and 2:1 heart block on admission, now with improved HR likely with atrial fibrillation. The HR was observed to be variable from the 80's to 110's while sitting up, awake, at rest. This variability is best explained by a-fib. Given the need to treat hypertension, AAA s/p repair and possible faster heart rates and the recent bradycardia and 2:1 AV Block on atenolol, I feel a pacemaker is warranted. I discussed that with him today and he understands the need for one and is agreeable to proceed. Will discuss with Dr. Johney Frame.  Time Spent Directly with Patient:  15  minutes  Length of Stay:  LOS: 2 days   Chrystie Nose, MD, [facility] Attending Cardiologist [facility]  Jamise Pentland C 02/08/2012, 8:09 AM

## 2012-02-08 NOTE — H&P (View-Only) (Signed)
SUBJECTIVE: The patient is very sleepy today.  He was up a lot last night due to frequent urination.  Presently, he states that his heart rate is better and blood pressure is good.  He is not ready to proceed with pacemaker.  Further discussion is difficult as he keeps falling asleep.    Marland Kitchen antiseptic oral rinse  15 mL Mouth Rinse BID  . aspirin  325 mg Oral Daily  .  ceFAZolin (ANCEF) IV  2 g Intravenous On Call  . chlorhexidine  60 mL Topical Once  . chlorhexidine  60 mL Topical Once  . escitalopram  20 mg Oral Daily  . gentamicin irrigation  80 mg Irrigation On Call  . hydrALAZINE  10 mg Intravenous Once  . niacin  500 mg Oral QHS  . pantoprazole  40 mg Oral Q1200  . simvastatin  10 mg Oral QHS  . sodium chloride  3 mL Intravenous Q12H  . DISCONTD: heparin  5,000 Units Subcutaneous Q8H      . sodium chloride 10 mL/hr at 02/06/12 1952  . sodium chloride 50 mL/hr at 02/07/12 2325  . sodium chloride      OBJECTIVE: Physical Exam: Filed Vitals:   02/07/12 2323 02/07/12 2355 02/08/12 0300 02/08/12 0353  BP: 160/74 132/52  164/59  Pulse:  66 58 65  Temp:  97.9 F (36.6 C)  98.3 F (36.8 C)  TempSrc:  Oral    Resp:  17 19 19   Height:      Weight:      SpO2:  100% 100% 100%    Intake/Output Summary (Last 24 hours) at 02/08/12 0745 Last data filed at 02/08/12 0700  Gross per 24 hour  Intake   1020 ml  Output   1900 ml  Net   -880 ml    Telemetry reveals afib with V rates 50s.  GEN- The patient is sleeping but rouses   Head- normocephalic, atraumatic Eyes-  Sclera clear, conjunctiva pink Ears- hearing intact Oropharynx- clear Neck- supple,   Lungs- Clear to ausculation bilaterally, normal work of breathing Heart- bradycardic irregular  rhythm, no murmurs, rubs or gallops, PMI not laterally displaced GI- soft, NT, ND, + BS Extremities- no clubbing, cyanosis, or edema   LABS: Basic Metabolic Panel:  Basename 02/07/12 0509 02/06/12 1855 02/06/12 1416  NA  144 -- 139  K 4.1 -- 4.4  CL 108 -- 104  CO2 22 -- 24  GLUCOSE 95 -- 128*  BUN 27* -- 34*  CREATININE 1.13 1.34 --  CALCIUM 9.3 -- 9.7  MG -- 2.0 --  PHOS -- -- --   Liver Function Tests:  Basename 02/06/12 1416  AST 14  ALT 8  ALKPHOS 65  BILITOT 0.4  PROT 6.8  ALBUMIN 3.5   No results found for this basename: LIPASE:2,AMYLASE:2 in the last 72 hours CBC:  Basename 02/07/12 0509 02/06/12 1855 02/06/12 1416  WBC 5.6 5.8 --  NEUTROABS -- -- 3.4  HGB 10.4* 11.4* --  HCT 30.3* 32.9* --  MCV 93.5 94.8 --  PLT 107* 113* --   Cardiac Enzymes:  Basename 02/07/12 0509 02/07/12 0054 02/06/12 1855  CKTOTAL 68 97 63  CKMB 2.0 2.3 2.2  CKMBINDEX -- -- --  TROPONINI <0.30 <0.30 <0.30   BNP: No components found with this basename: POCBNP:3 D-Dimer: No results found for this basename: DDIMER:2 in the last 72 hours Hemoglobin A1C:  Basename 02/06/12 1855  HGBA1C 6.3*   Fasting Lipid Panel:  [employer]  02/07/12 0509  CHOL 83  HDL 49  LDLCALC 17  TRIG 86  CHOLHDL 1.7  LDLDIRECT --   Thyroid Function Tests:  Basename 02/06/12 1855  TSH 5.619*  T4TOTAL --  T3FREE --  THYROIDAB --   Anemia Panel: No results found for this basename: VITAMINB12,FOLATE,FERRITIN,TIBC,IRON,RETICCTPCT in the last 72 hours  RADIOLOGY: Dg Chest Port 1 View  02/06/2012  *RADIOLOGY REPORT*  Clinical Data: Shortness of breath.  Generalized weakness.  History of leukemia.  PORTABLE CHEST - 1 VIEW 02/06/2012 1425 hours:  Comparison: CT chest 06/05/2009, 06/06/2008.  One-view chest x-ray 12/11/2007.  Findings: Cardiac silhouette moderately enlarged for the AP portable technique, unchanged.  Thoracic aorta atherosclerotic, unchanged.  Hilar and mediastinal contours otherwise unremarkable. Lungs clear.  Bronchovascular markings normal.  Pulmonary vascularity normal.  No pneumothorax.  No pleural effusions.  IMPRESSION: No acute cardiopulmonary disease.  Stable moderate cardiomegaly.  Original Report  Authenticated By: Arnell Sieving, M.D.    ASSESSMENT AND PLAN:  Principal Problem:  *AV block, 2nd degree Active Problems:  HYPERLIPIDEMIA  THROMBOCYTOPENIA  HYPERTENSION, though recently has been hypotensive  BENIGN PROSTATIC HYPERTROPHY, WITH OBSTRUCTION  Memory loss  Lymphoma, history of  H/O colostomy  History of AAA (abdominal aortic aneurysm) repair  CKD (chronic kidney disease) stage 3, GFR 30-59 ml/min  1. AV block- the patient has had 2:1 AV block with symptoms.  Atenolol has washed out.  He has converted to afib and V rates are presently 50s.  I would continue to advise that he proceed with PPM.  Presently, the patient is not ready to commit to pacemaker.  He is too sleepy to have more meaningful discussion at this point.  I will return later today. I have reserved a slot on our schedule for tentative PPM implant this afternoon if he decides to proceed.  IF he declines, then we should continue further inpatient observation.  2. afib- now in afib,but less than 24 hours. Would not initiate anticoagulation at this point, though this may be necessary down the road.    Hillis Range, MD 02/08/2012 7:45 AM    Addendum Upon further discussion with Mr Tant and his family, he is now willing to proceed with pacemaker implantation. The patient has symptomatic second degree AV block.  AV block persists despite washout of AV nodal agents.  No other reversible causes are identified.  He has degenerative conduction disease and unreliable AV nodal conduction.  I would therefore recommend pacemaker implantation at this time.  Risks, benefits, alternatives to pacemaker implantation were discussed in detail with the patient today. The patient understands that the risks include but are not limited to bleeding, infection, pneumothorax, perforation, tamponade, vascular damage, renal failure, MI, stroke, death,  and lead dislodgement and wishes to proceed. I also discussed pros and cons to  the MDT Revo MRI compatable pacemaker system.  I have informed him that in our experience, there is an increase incidence of perforation and lead dislodgement with this system.  I have informed him that in all likelihood, he could have this pacemaker implantation without difficulty.  After discussion with his family, he is clear that he would not want to assume possible risks of this new technology and would prefer a "standard" pacemaker. We will therefore schedule the procedure later today.  Fayrene Fearing Albirtha Grinage,MD 02/08/2012 12:14 PM

## 2012-02-08 NOTE — Progress Notes (Addendum)
 SUBJECTIVE: The patient is very sleepy today.  He was up a lot last night due to frequent urination.  Presently, he states that his heart rate is better and blood pressure is good.  He is not ready to proceed with pacemaker.  Further discussion is difficult as he keeps falling asleep.    . antiseptic oral rinse  15 mL Mouth Rinse BID  . aspirin  325 mg Oral Daily  .  ceFAZolin (ANCEF) IV  2 g Intravenous On Call  . chlorhexidine  60 mL Topical Once  . chlorhexidine  60 mL Topical Once  . escitalopram  20 mg Oral Daily  . gentamicin irrigation  80 mg Irrigation On Call  . hydrALAZINE  10 mg Intravenous Once  . niacin  500 mg Oral QHS  . pantoprazole  40 mg Oral Q1200  . simvastatin  10 mg Oral QHS  . sodium chloride  3 mL Intravenous Q12H  . DISCONTD: heparin  5,000 Units Subcutaneous Q8H      . sodium chloride 10 mL/hr at 02/06/12 1952  . sodium chloride 50 mL/hr at 02/07/12 2325  . sodium chloride      OBJECTIVE: Physical Exam: Filed Vitals:   02/07/12 2323 02/07/12 2355 02/08/12 0300 02/08/12 0353  BP: 160/74 132/52  164/59  Pulse:  66 58 65  Temp:  97.9 F (36.6 C)  98.3 F (36.8 C)  TempSrc:  Oral    Resp:  17 19 19  Height:      Weight:      SpO2:  100% 100% 100%    Intake/Output Summary (Last 24 hours) at 02/08/12 0745 Last data filed at 02/08/12 0700  Gross per 24 hour  Intake   1020 ml  Output   1900 ml  Net   -880 ml    Telemetry reveals afib with V rates 50s.  GEN- The patient is sleeping but rouses   Head- normocephalic, atraumatic Eyes-  Sclera clear, conjunctiva pink Ears- hearing intact Oropharynx- clear Neck- supple,   Lungs- Clear to ausculation bilaterally, normal work of breathing Heart- bradycardic irregular  rhythm, no murmurs, rubs or gallops, PMI not laterally displaced GI- soft, NT, ND, + BS Extremities- no clubbing, cyanosis, or edema   LABS: Basic Metabolic Panel:  Basename 02/07/12 0509 02/06/12 1855 02/06/12 1416  NA  144 -- 139  K 4.1 -- 4.4  CL 108 -- 104  CO2 22 -- 24  GLUCOSE 95 -- 128*  BUN 27* -- 34*  CREATININE 1.13 1.34 --  CALCIUM 9.3 -- 9.7  MG -- 2.0 --  PHOS -- -- --   Liver Function Tests:  Basename 02/06/12 1416  AST 14  ALT 8  ALKPHOS 65  BILITOT 0.4  PROT 6.8  ALBUMIN 3.5   No results found for this basename: LIPASE:2,AMYLASE:2 in the last 72 hours CBC:  Basename 02/07/12 0509 02/06/12 1855 02/06/12 1416  WBC 5.6 5.8 --  NEUTROABS -- -- 3.4  HGB 10.4* 11.4* --  HCT 30.3* 32.9* --  MCV 93.5 94.8 --  PLT 107* 113* --   Cardiac Enzymes:  Basename 02/07/12 0509 02/07/12 0054 02/06/12 1855  CKTOTAL 68 97 63  CKMB 2.0 2.3 2.2  CKMBINDEX -- -- --  TROPONINI <0.30 <0.30 <0.30   BNP: No components found with this basename: POCBNP:3 D-Dimer: No results found for this basename: DDIMER:2 in the last 72 hours Hemoglobin A1C:  Basename 02/06/12 1855  HGBA1C 6.3*   Fasting Lipid Panel:  Basename   02/07/12 0509  CHOL 83  HDL 49  LDLCALC 17  TRIG 86  CHOLHDL 1.7  LDLDIRECT --   Thyroid Function Tests:  Basename 02/06/12 1855  TSH 5.619*  T4TOTAL --  T3FREE --  THYROIDAB --   Anemia Panel: No results found for this basename: VITAMINB12,FOLATE,FERRITIN,TIBC,IRON,RETICCTPCT in the last 72 hours  RADIOLOGY: Dg Chest Port 1 View  02/06/2012  *RADIOLOGY REPORT*  Clinical Data: Shortness of breath.  Generalized weakness.  History of leukemia.  PORTABLE CHEST - 1 VIEW 02/06/2012 1425 hours:  Comparison: CT chest 06/05/2009, 06/06/2008.  One-view chest x-ray 12/11/2007.  Findings: Cardiac silhouette moderately enlarged for the AP portable technique, unchanged.  Thoracic aorta atherosclerotic, unchanged.  Hilar and mediastinal contours otherwise unremarkable. Lungs clear.  Bronchovascular markings normal.  Pulmonary vascularity normal.  No pneumothorax.  No pleural effusions.  IMPRESSION: No acute cardiopulmonary disease.  Stable moderate cardiomegaly.  Original Report  Authenticated By: THOMAS E. LAWRENCE, M.D.    ASSESSMENT AND PLAN:  Principal Problem:  *AV block, 2nd degree Active Problems:  HYPERLIPIDEMIA  THROMBOCYTOPENIA  HYPERTENSION, though recently has been hypotensive  BENIGN PROSTATIC HYPERTROPHY, WITH OBSTRUCTION  Memory loss  Lymphoma, history of  H/O colostomy  History of AAA (abdominal aortic aneurysm) repair  CKD (chronic kidney disease) stage 3, GFR 30-59 ml/min  1. AV block- the patient has had 2:1 AV block with symptoms.  Atenolol has washed out.  He has converted to afib and V rates are presently 50s.  I would continue to advise that he proceed with PPM.  Presently, the patient is not ready to commit to pacemaker.  He is too sleepy to have more meaningful discussion at this point.  I will return later today. I have reserved a slot on our schedule for tentative PPM implant this afternoon if he decides to proceed.  IF he declines, then we should continue further inpatient observation.  2. afib- now in afib,but less than 24 hours. Would not initiate anticoagulation at this point, though this may be necessary down the road.    Carmel Garfield, MD 02/08/2012 7:45 AM    Addendum Upon further discussion with Mr Gulotta and his family, he is now willing to proceed with pacemaker implantation. The patient has symptomatic second degree AV block.  AV block persists despite washout of AV nodal agents.  No other reversible causes are identified.  He has degenerative conduction disease and unreliable AV nodal conduction.  I would therefore recommend pacemaker implantation at this time.  Risks, benefits, alternatives to pacemaker implantation were discussed in detail with the patient today. The patient understands that the risks include but are not limited to bleeding, infection, pneumothorax, perforation, tamponade, vascular damage, renal failure, MI, stroke, death,  and lead dislodgement and wishes to proceed. I also discussed pros and cons to  the MDT Revo MRI compatable pacemaker system.  I have informed him that in our experience, there is an increase incidence of perforation and lead dislodgement with this system.  I have informed him that in all likelihood, he could have this pacemaker implantation without difficulty.  After discussion with his family, he is clear that he would not want to assume possible risks of this new technology and would prefer a "standard" pacemaker. We will therefore schedule the procedure later today.  Valerye Kobus,MD 02/08/2012 12:14 PM  

## 2012-02-08 NOTE — Op Note (Signed)
SURGEON:  Hillis Range, MD     PREPROCEDURE DIAGNOSIS:  Mobitz II second degree AV block, atrial fibrillation with tachycardia/ bradycardia syndrome    POSTPROCEDURE DIAGNOSIS:  Mobitz II second degree AV block, atrial flutter with tachycardia/ bradycardia syndrome     PROCEDURES:   1. Pacemaker implantation.     INTRODUCTION: [name] [name] is a 78 y.o. male  with a history of bradycardia who presents today for pacemaker implantation.  The patient reports intermittent episodes of dizziness over the past few days.  Upon evaluation, he was initially found to have complete heart block.  This improved to 2:1 AV conduction.  His atenolol was initially held.  Due to subsequent afib with RVR it was   No reversible causes have been identified.  The patient therefore presents today for pacemaker implantation.     DESCRIPTION OF PROCEDURE:  Informed written consent was obtained, and the patient was brought to the electrophysiology lab in a fasting state.  The patient required no sedation for the procedure today.  The patients left chest was prepped and draped in the usual sterile fashion by the EP lab staff. The skin overlying the left deltopectoral region was infiltrated with lidocaine for local analgesia.  A 4-cm incision was made over the left deltopectoral region.  A left subcutaneous pacemaker pocket was fashioned using a combination of sharp and blunt dissection. Electrocautery was required to assure hemostasis.    RA/RV Lead Placement: The left axillary vein was cannulated.  Through the left axillary vein, a [employer] model [id number] (serial number  [id number]) right atrial lead and a [facility] model 1946- 58 (serial number [id number]) right ventricular lead were advanced with fluoroscopic visualization into the right atrial appendage and right ventricular apex positions respectively.  The patient was in atrial fibrillation.  Initial atrial lead fib- waves measured 1.2 mV with impedance of 452  ohms.  Right ventricular lead R-waves measured 11 mV with an impedance of 758 ohms and a threshold of 0.5 V at 0.5 msec.  Both leads were secured to the pectoralis fascia using #2-0 silk over the suture sleeves.   Device Placement:  The leads were then connected to a [employer] Accent DR RF model [id number] (serial number [id number] ) pacemaker.  The pocket was irrigated with copious gentamicin solution.  The pacemaker was then placed into the pocket.  The pocket was then closed in 2 layers with 2.0 Vicryl suture for the subcutaneous and subcuticular layers.  Steri- Strips and a sterile dressing were then applied.  There were no early apparent complications.     CONCLUSIONS:   1. Successful implantation of a Industrial/product designer DR RF dual-chamber pacemaker for Mobitz II second degree AV block and atrial fibrillation with tachycardia/ bradycardia syndrome    2. No early apparent complications.           Hillis Range, MD 02/08/2012 5:45 PM

## 2012-02-09 ENCOUNTER — Other Ambulatory Visit: Payer: Self-pay

## 2012-02-09 ENCOUNTER — Encounter: Payer: Self-pay | Admitting: *Deleted

## 2012-02-09 ENCOUNTER — Encounter (HOSPITAL_COMMUNITY)
Admission: EM | Disposition: A | Discharge status: Home / Self Care | Payer: Self-pay | Source: Home / Self Care | Attending: Internal Medicine

## 2012-02-09 ENCOUNTER — Inpatient Hospital Stay (HOSPITAL_COMMUNITY): Payer: Medicare Other

## 2012-02-09 ENCOUNTER — Encounter (HOSPITAL_COMMUNITY): Payer: Self-pay | Admitting: Certified Registered"

## 2012-02-09 ENCOUNTER — Inpatient Hospital Stay (HOSPITAL_COMMUNITY): Payer: Medicare Other | Admitting: Certified Registered"

## 2012-02-09 DIAGNOSIS — Z950 Presence of cardiac pacemaker: Secondary | ICD-10-CM | POA: Insufficient documentation

## 2012-02-09 DIAGNOSIS — I441 Atrioventricular block, second degree: Secondary | ICD-10-CM

## 2012-02-09 HISTORY — PX: CARDIOVERSION: SHX1299

## 2012-02-09 SURGERY — CARDIOVERSION
Anesthesia: General | Wound class: Clean

## 2012-02-09 MED ORDER — SODIUM CHLORIDE 0.9 % IV SOLN
INTRAVENOUS | Status: DC | PRN
  Administered 2012-02-09: 16:00:00 via INTRAVENOUS

## 2012-02-09 MED ORDER — EPTIFIBATIDE 75 MG/100ML IV SOLN
INTRAVENOUS | Status: AC
  Filled 2012-02-09: qty 100

## 2012-02-09 MED ORDER — PROPOFOL 10 MG/ML IV EMUL
INTRAVENOUS | Status: DC | PRN
  Administered 2012-02-09: 80 mg via INTRAVENOUS

## 2012-02-09 NOTE — Anesthesia Postprocedure Evaluation (Signed)
  Anesthesia Post-op Note  Patient: [name] [name]  Procedure(s) Performed: Procedure(s) (LRB): CARDIOVERSION (N/A)  Patient Location: ICU  Anesthesia Type: General  Level of Consciousness: awake, alert  and oriented  Airway and Oxygen Therapy: Patient Spontanous Breathing and Patient connected to nasal cannula oxygen  Post-op Pain: mild  Post-op Assessment: Post-op Vital signs reviewed  Post-op Vital Signs: Reviewed  Complications: No apparent anesthesia complications

## 2012-02-09 NOTE — Progress Notes (Signed)
[facility]  Subjective: No complaints  Objective: Vital signs in last 24 hours: Temp:  [97.9 F (36.6 C)-99.2 F (37.3 C)] 98.2 F (36.8 C) (06/26 0741) Pulse Rate:  [64-97] 69  (06/26 0741) Resp:  [10-20] 20  (06/26 0741) BP: (114-169)/(59-77) 119/60 mmHg (06/26 0741) SpO2:  [96 %-99 %] 99 % (06/26 0741) Weight:  [80.2 kg (176 lb 12.9 oz)] 80.2 kg (176 lb 12.9 oz) (06/26 0434) Last BM Date: 02/08/12  Intake/Output from previous day: 06/25 0701 - 06/26 0700 In: 540 [P.O.:240; I.V.:200; IV Piggyback:100] Out: 1450 [Urine:1450] Intake/Output this shift:    Medications Current Facility-Administered Medications  Medication Dose Route Frequency Provider Last Rate Last Dose  . ALPRAZolam Prudy Feeler) tablet 0.25 mg  0.25 mg Oral BID PRN Nada Boozer, NP   0.25 mg at 02/07/12 1153  . amLODipine (NORVASC) tablet 10 mg  10 mg Oral Daily Ok Anis, NP   10 mg at 02/08/12 2100  . antiseptic oral rinse (BIOTENE) solution 15 mL  15 mL Mouth Rinse BID Chrystie Nose, MD   15 mL at 02/08/12 2000  . aspirin tablet 325 mg  325 mg Oral Daily Nada Boozer, NP   325 mg at 02/08/12 1032  . atenolol (TENORMIN) tablet 25 mg  25 mg Oral Daily Ok Anis, NP   25 mg at 02/08/12 2100  . ceFAZolin (ANCEF) IVPB 1 g/50 mL premix  1 g Intravenous Q6H Ok Anis, NP   1 g at 02/09/12 4098  . escitalopram (LEXAPRO) tablet 20 mg  20 mg Oral Daily Nada Boozer, NP   20 mg at 02/08/12 1032  . heparin 2-0.9 UNIT/ML-% infusion           . HYDROcodone-acetaminophen (NORCO) 5-325 MG per tablet 1 tablet  1 tablet Oral Q4H PRN Nada Boozer, NP   1 tablet at 02/09/12 0846  . irbesartan (AVAPRO) tablet 300 mg  300 mg Oral Daily Ok Anis, NP   300 mg at 02/08/12 2100  . lidocaine (XYLOCAINE) 1 % injection           . niacin (NIASPAN) CR tablet 500 mg  500 mg Oral QHS Nada Boozer, NP   500 mg at 02/08/12 2245  . nitroGLYCERIN (NITROSTAT) SL tablet 0.4  mg  0.4 mg Sublingual Q5 Min x 3 PRN Nada Boozer, NP      . ondansetron [facility]) injection 4 mg  4 mg Intravenous Q6H PRN Nada Boozer, NP      . pantoprazole (PROTONIX) EC tablet 40 mg  40 mg Oral Q1200 Nada Boozer, NP   40 mg at 02/08/12 1336  . simvastatin (ZOCOR) tablet 10 mg  10 mg Oral QHS Nada Boozer, NP   10 mg at 02/08/12 2245  . DISCONTD: 0.9 %  sodium chloride infusion   Intravenous Continuous Nada Boozer, NP 10 mL/hr at 02/06/12 1952    . DISCONTD: 0.9 %  sodium chloride infusion   Intravenous Continuous Duke Salvia, MD 50 mL/hr at 02/08/12 1035    . DISCONTD: 0.9 %  sodium chloride infusion  250 mL Intravenous Continuous Duke Salvia, MD      . DISCONTD: acetaminophen (TYLENOL) tablet 650 mg  650 mg Oral Q4H PRN Nada Boozer, NP      . DISCONTD: ceFAZolin (ANCEF) IVPB 2 g/50 mL premix  2 g Intravenous On Call Duke Salvia, MD      . DISCONTD: chlorhexidine (HIBICLENS) 4 % liquid  4 application  60 mL Topical Once Duke Salvia, MD      . DISCONTD: gentamicin (GARAMYCIN) 80 mg in sodium chloride irrigation 0.9 % 500 mL irrigation  80 mg Irrigation On Call Duke Salvia, MD      . DISCONTD: sodium chloride 0.9 % injection 3 mL  3 mL Intravenous Q12H Duke Salvia, MD   3 mL at 02/08/12 1000  . DISCONTD: sodium chloride 0.9 % injection 3 mL  3 mL Intravenous PRN Duke Salvia, MD      . DISCONTD: sodium chloride 0.9 % injection 3 mL  3 mL Intravenous Q12H Ok Anis, NP   3 mL at 02/08/12 2100  . DISCONTD: sodium chloride 0.9 % injection 3 mL  3 mL Intravenous PRN Ok Anis, NP        PE: General appearance: alert, cooperative and no distress Lungs: clear to auscultation bilaterally Heart: regular rate and rhythm and 1/6 sys MM. Extremities: No LEE Pulses: 2+ and symmetric Pacer site with minimal drainage.  Steri strips in place.  Lab Results:   Basename 02/07/12 0509 02/06/12 1855 02/06/12 1416  WBC 5.6 5.8 5.8  HGB 10.4* 11.4* 11.0*  HCT  30.3* 32.9* 32.0*  PLT 107* 113* 102*   BMET  Basename 02/07/12 0509 02/06/12 1855 02/06/12 1416  NA 144 -- 139  K 4.1 -- 4.4  CL 108 -- 104  CO2 22 -- 24  GLUCOSE 95 -- 128*  BUN 27* -- 34*  CREATININE 1.13 1.34 1.53*  CALCIUM 9.3 -- 9.7   PT/INR  Basename 02/06/12 1855  LABPROT 15.3*  INR 1.18   Lipid Panel     Component Value Date/Time   CHOL 83 02/07/2012 0509   TRIG 86 02/07/2012 0509   HDL 49 02/07/2012 0509   CHOLHDL 1.7 02/07/2012 0509   VLDL 17 02/07/2012 0509   LDLCALC 17 02/07/2012 0509   CHEST - 2 VIEW  Comparison: 02/06/2012  Findings: Interval placement of a left subclavian dual lead  pacemaker, leads extending to the right atrium and towards the  right ventricular apex. No pneumothorax. Lungs are clear. Mild  cardiomegaly. No effusion.  IMPRESSION:  1. Pacemaker placement without pneumothorax or other complication.  Assessment/Plan  Principal Problem:  *AV block, 2nd degree Active Problems:  HYPERLIPIDEMIA  THROMBOCYTOPENIA  HYPERTENSION, though recently has been hypotensive  BENIGN PROSTATIC HYPERTROPHY, WITH OBSTRUCTION  Memory loss  Lymphoma, history of  H/O colostomy  History of AAA (abdominal aortic aneurysm) repair  CKD (chronic kidney disease) stage 3, GFR 30-59 ml/min  A-fib  Plan:  SP PPM([employer] Accent DR RF) for Mobitz II second degree AV block, atrial fibrillation with tachycardia/ bradycardia syndrome.  No Pneumothorax on CXR. New afib 02/08/12.  Consider DCCV today. <48Hours.   Mild elevation of TSH(5.619).  BP controlled. ASA only currently.      LOS: 3 days    [name] [name] 02/09/2012 9:15 AM

## 2012-02-09 NOTE — Interval H&P Note (Signed)
History and Physical Interval Note:  02/09/2012 4:07 PM  [name] [name]  has presented today for surgery, with the diagnosis of a flutter  The various methods of treatment have been discussed with the patient and family. After consideration of risks, benefits and other options for treatment, the patient has consented to  Procedure(s) (LRB): CARDIOVERSION (N/A) as a surgical intervention .  The patient's history has been reviewed, patient examined, no change in status, stable for surgery.  I have reviewed the patients' chart and labs.  Questions were answered to the patient's satisfaction.     Alysia Scism C     [facility]          INTERVAL PROCEDURE H&P   History and Physical Interval Note:  02/09/2012 4:07 PM  [name] [name] has presented today for their planned procedure. The various methods of treatment have been discussed with the patient and family. After consideration of risks, benefits and other options for treatment, the patient has consented to the procedure.  The patients' outpatient history has been reviewed, patient examined, and no change in status from most recent office note within the past 30 days. I have reviewed the patients' chart and labs and will proceed as planned. Questions were answered to the patient's satisfaction.   Chrystie Nose, MD, [facility] Attending Cardiologist [facility]  Dorma Altman C 02/09/2012, 4:07 PM

## 2012-02-09 NOTE — Preoperative (Addendum)
Beta Blockers   Reason not to administer Beta Blockers:Not Applicable, last dose 02/09/12 at 10:41

## 2012-02-09 NOTE — Anesthesia Preprocedure Evaluation (Addendum)
Anesthesia Evaluation  Patient identified by MRN, date of birth, ID band Patient awake    Reviewed: Allergy & Precautions, H&P , NPO status , Patient's Chart, lab work & pertinent test results, reviewed documented beta blocker date and time   Airway Mallampati: I TM Distance: >3 FB Neck ROM: Full    Dental  (+) Teeth Intact and Dental Advisory Given   Pulmonary  breath sounds clear to auscultation        Cardiovascular hypertension, + dysrhythmias Atrial Fibrillation + pacemaker + Valvular Problems/Murmurs Rhythm:Regular Rate:Normal     Neuro/Psych PSYCHIATRIC DISORDERS Depression  Neuromuscular disease    GI/Hepatic   Endo/Other    Renal/GU      Musculoskeletal   Abdominal   Peds  Hematology   Anesthesia Other Findings   Reproductive/Obstetrics                          Anesthesia Physical Anesthesia Plan  ASA: III  Anesthesia Plan: General   Post-op Pain Management:    Induction: Intravenous  Airway Management Planned: Mask  Additional Equipment:   Intra-op Plan:   Post-operative Plan:   Informed Consent: I have reviewed the patients History and Physical, chart, labs and discussed the procedure including the risks, benefits and alternatives for the proposed anesthesia with the patient or authorized representative who has indicated his/her understanding and acceptance.     Plan Discussed with: CRNA, Anesthesiologist and Surgeon  Anesthesia Plan Comments:         Anesthesia Quick Evaluation

## 2012-02-09 NOTE — Progress Notes (Signed)
Pt. Seen and examined. Agree with the NP/PA-C note as written.  Possible cardioversion today if it can be arranged with anesthesia. He is in atrial fibrillation, which appears new onset yesterday. If we cannot arrange it today, would wait 4 days (per EP recs) and then start him on Eliquis 2.5 mg BID and consider cardioversion in 1 month.  Chrystie Nose, MD, [facility] Attending Cardiologist [facility]

## 2012-02-09 NOTE — Transfer of Care (Signed)
Immediate Anesthesia Transfer of Care Note  Patient: [name] [name]  Procedure(s) Performed: Procedure(s) (LRB): CARDIOVERSION (N/A)  Patient Location: Nursing Unit  Anesthesia Type: General  Level of Consciousness: patient cooperative, lethargic and responds to stimulation  Airway & Oxygen Therapy: Patient Spontanous Breathing and Patient connected to nasal cannula oxygen  Post-op Assessment: Report given to PACU RN  Post vital signs: Reviewed and stable  Complications: No apparent anesthesia complications

## 2012-02-09 NOTE — Progress Notes (Signed)
   ELECTROPHYSIOLOGY ROUNDING NOTE    Patient Name: [name] [name] Date of Encounter: 02-09-2012    SUBJECTIVE:Patient feels well.  Slept better last night.  No chest pain or shortness of breath.  Incisional soreness relieved with Vicodin.  Status post pacemaker implant 02-08-2012.  TELEMETRY: Reviewed telemetry pt in atrial fibrillation with occasional ventricular pacing Filed Vitals:   02/09/12 0100 02/09/12 0300 02/09/12 0434 02/09/12 0500  BP: 114/73 119/64  119/60  Pulse: 65 64  65  Temp:  98.5 F (36.9 C)    TempSrc:  Oral    Resp: 19 18  19   Height:      Weight:   176 lb 12.9 oz (80.2 kg)   SpO2: 99% 96%  98%    Intake/Output Summary (Last 24 hours) at 02/09/12 0655 Last data filed at 02/09/12 0400  Gross per 24 hour  Intake    540 ml  Output   1450 ml  Net   -910 ml    LABS: Basic Metabolic Panel:  Basename 02/07/12 0509 02/06/12 1855 02/06/12 1416  NA 144 -- 139  K 4.1 -- 4.4  CL 108 -- 104  CO2 22 -- 24  GLUCOSE 95 -- 128*  BUN 27* -- 34*  CREATININE 1.13 1.34 --  CALCIUM 9.3 -- 9.7  MG -- 2.0 --  PHOS -- -- --   Liver Function Tests:  Basename 02/06/12 1416  AST 14  ALT 8  ALKPHOS 65  BILITOT 0.4  PROT 6.8  ALBUMIN 3.5   CBC:  Basename 02/07/12 0509 02/06/12 1855 02/06/12 1416  WBC 5.6 5.8 --  NEUTROABS -- -- 3.4  HGB 10.4* 11.4* --  HCT 30.3* 32.9* --  MCV 93.5 94.8 --  PLT 107* 113* --   Cardiac Enzymes:  Basename 02/07/12 0509 02/07/12 0054 02/06/12 1855  CKTOTAL 68 97 63  CKMB 2.0 2.3 2.2  CKMBINDEX -- -- --  TROPONINI <0.30 <0.30 <0.30   Radiology/Studies:  Final result pending, leads in stable position.  PHYSICAL EXAM Left chest without hematoma or ecchymosis.  A&Ox3, NAD, OP clear, CTAB, RRR (paced), s/nt/nd +BS, no c/c/e  DEVICE INTERROGATION: Device interrogation reviewed in paper chart   CXR- no ptx, stable lead position  Wound care reviewed with patient.  No follow up scheduled with [place], pacemaker  follow up to be done with [facility].   Assessment/Plan per Dr Johney Frame.   1. Mobitz II second degree AV block Doing well s/p pacemaker Follow-up for wound check with Dr C in 10 days  2. afib- duration is <48 hours.  Could consider cardioversion today.  If he remains in afib longer than 48 hours then he should be initiated on anticoagulation in 3-4 days (once pacemaker pocket is healed) I will defer this decision to Dr Rennis Golden.  I will see as needed Please call with questions.  Fayrene Fearing Drevion Offord,MD

## 2012-02-09 NOTE — CV Procedure (Addendum)
[facility]  CARDIOVERSION NOTE   Procedure: Electrical Cardioversion Indications:  Atrial Fibrillation  Procedure Details:  Consent: Risks of procedure as well as the alternatives and risks of each were explained to the (patient/caregiver).  Consent for procedure obtained.  Time Out: Verified patient identification, verified procedure, site/side was marked, verified correct patient position, special equipment/implants available, medications/allergies/relevent history reviewed, required imaging and test results available.  Performed  Patient placed on cardiac monitor, pulse oximetry, supplemental oxygen as necessary.  Sedation given: propofol, per anesthesia Pacer pads placed right anterior chest and left apical.  Cardioverted 1 time(s).  Cardioverted at 150J biphasic  Evaluation: Findings: Post procedure EKG shows: Normal sinus rhythm with v-pacing. Complications: None Patient did tolerate procedure well.  Will have the pacemaker interrogated post-procedure. Plan at least 1 month of Eliquis 2.5 mg BID, starting in 3 days from now to allow the pacer site to heal.  Probable okay to d/c home tomorrow.  Time Spent Directly with the Patient:  15 minutes   Chrystie Nose, MD, [facility] Attending Cardiologist [facility]  Solon Alban C 02/09/2012, 4:21 PM

## 2012-02-10 MED ORDER — YOU HAVE A PACEMAKER BOOK
Freq: Once | Status: AC
  Administered 2012-02-10: 12:00:00
  Filled 2012-02-10: qty 1

## 2012-02-10 NOTE — Discharge Instructions (Signed)
Supplemental Discharge Instructions for  Pacemaker/Defibrillator Patients  Activity Do not raise your left/right arm above shoulder level or extend it backward beyond shoulder level for 2 weeks. Wear the arm sling as a reminder or as needed for comfort for 2 weeks. No heavy lifting or vigorous activity with your left/right arm for 6-8 weeks.    NO DRIVING is preferable for 2 weeks; If absolutely necessary, drive only short, familiar routes. DO wear your seatbelt, even if it crosses over the pacemaker site.  WOUND CARE   Keep the wound area clean and dry.  Remove the dressing the day after you return home (usually 48 hours after the procedure).   DO NOT SUBMERGE UNDER WATER UNTIL FULLY HEALED (no tub baths, hot tubs, swimming pools, etc.).    You  may shower or take a sponge bath after the dressing is removed. DO NOT SOAK the area and do not allow the shower to directly spray on the site.   If you have staples, these will be removed in the office in 7-14 days.   If you have tape/steri-strips on your wound, these will fall off; do not pull them off prematurely.     No bandage is needed on the site.  DO  NOT apply any creams, oils, or ointments to the wound area.   If you notice any drainage or discharge from the wound, any swelling, excessive redness or bruising at the site, or if you develop a fever > 101? F after you are discharged home, call the office at once.  Special Instructions   You are still able to use cellular telephones.  Avoid carrying your cellular phone near your device.   When traveling through airports, show security personnel your identification card to avoid being screened in the metal detectors.    Avoid arc welding equipment, MRI testing (magnetic resonance imaging), TENS units (transcutaneous nerve stimulators).  Call the office for questions about other devices.   Avoid electrical appliances that are in poor condition or are not properly grounded.   Microwave ovens are  safe to be near or to operate.  Additional information for defibrillator patients should your device go off:   If your device goes off ONCE and you feel fine afterward, notify the clinic at [phone].   If your device goes off ONCE and you do not feel well afterward, call 911.   If your device goes off TWICE or more in one day, call 911.  DO NOT DRIVE YOURSELF OR A FAMILY MEMBER WITH A DEFIBRILLATOR TO THE HOSPITAL--CALL 911.   Dual-Chamber Pacemaker A pacemaker is a small, lightweight, battery-powered device that is implanted under the skin in the upper chest. Your caregiver may place a pacemaker if your heartbeat is too slow (bradycardia) or if you experience symptoms from a slow heartbeat. A dual-chamber pacemaker has 2 leads (electrodes) that are connected in your heart. One lead is placed in the upper chamber of the heart, called the right atrium. The second lead is placed in the lower part of the heart, called the right ventricle. Dual-chamber pacemakers may pace in both the upper chamber and lower chamber. By doing so, correct rhythm and function are often maintained. When the heart rate is too slow, the pacemaker senses the heartbeat and will pace the heart at a programmed rate. CAUSES  Different conditions can cause a slow heart rate. Some of these can include:  Sick sinus syndrome. This is a type of slow heart rate where the "pacemaker"  of the heart does not work very well. It is often related to aging.   Heart attack (myocardial infarction). This can damage the heart muscle and cause a slow heart beat.   Heart block. This is a condition where the signal that causes the heart to beat does not communicate very well between the upper chambers of the heart and the lower chambers of the heart.   Some heart medications that control fast heart rates or other abnormal heart rhythms can also cause a slow heart rate.  SYMPTOMS  A very slow heart rate results in the heart not pumping  enough blood to your body. Symptoms of a slow heart rate can include:  Passing out (fainting).   Confusion.   Shortness of breath.   Tiredness (fatigue).   Ankle swelling.   Chest discomfort or pain.  DIAGNOSIS  Tests will be done to look at how your heart works and beats. This can include:  A physical exam.   An electrocardiogram (ECG). An ECG records your heart beat on a strip of paper for your caregiver to look at.   Continuous ECG monitoring:   Holter monitor or an Event monitor. These devices record your heart rhythm and can be worn for 24 or more hours at a time. Your caregiver can then look at the recorded history of your heartbeat.   An electrophysiology study. This is a test to study the heart's electrical system. If your heart has a disruption in its electrical pathway, a slow heart beat can occur.  PACEMAKER IMPLANTATION  Do not eat or drink for 6 hours before the procedure or as told by your caregiver.   Pacemaker implantation usually takes about one hour.   Your skin on your upper chest will be cleaned with germ-killing soap.   Sedation will be given through an IV. This will help you relax during the procedure.   The site of the incision, often just below a collarbone, will be injected with numbing medicine.   The insulated electrode is inserted through a large vein in your chest. Then, using a special type of X-ray (fluoroscopy), the tip is positioned in the target area of your heart. The end of the pacemaker lead is fixated to your heart by a corkscrew tip or by small "tines" (soft anchor hooks).   The connection between the pacemaker electrode and the heart is checked to ensure optimal contact and placement.   After your pacemaker is implanted, you will need to stay in the hospital to make sure the pacemaker is working correctly. You will be able to go home when your caregiver feels it is safe for you to do so.  HOME CARE INSTRUCTIONS   Excessive movement  of the arm next to the new pacemaker can cause the electrodes to dislodge. Your caregiver will determine how many days the upper arm should not be moved excessively. It is usually three or more days.   The incision needs to be kept dry as told by your caregiver. As with any surgery, if the incision becomes swollen, red or pus (yellow or tan drainage) appears, call your caregiver right away.   Your caregiver may use small strips of tape hold the incision closed. They should be allowed to fall off naturally. Do not pull the strips of tape off.   Digital cell phones should be kept 12 inches away from the pacemaker. Hold them at the ear on the side opposite of the pacer.   Never leave a  cell phone in a pocket over the pacemaker.   Avoid strong electro-magnetic fields. You will not be able to have an MRI scan because of the strong magnets.   The pacemaker battery should last several years. The pacemaker needs to be checked at regular intervals as told by your caregiver.  RISKS AND COMPLICATIONS An implanted pacemaker has risks. Some of these can include:  Infection.   The pacemaker electrode can become dislodged. If this should happen, a second surgery would be needed to reposition it.   During pacemaker implantation, it is possible to puncture the lung. This is a very rare occurrence.  SEEK MEDICAL CARE IF:   You have dizziness or pass out.   You feel your heart "skipping" beats or feel your heart "racing."   Hiccups that do not go away.   You develop redness, swelling or pain at the pacemaker insertion site.   The pacemaker insertion site has yellow drainage or there is a bad odor coming from the insertion site.   An unexplained temperature of 102 F (38.9 C) or above develops.  MAKE SURE YOU:   Understand these instructions.   Will watch your condition.   Will get help right away if you are not doing well or get worse.  Document Released: 05/30/2009 Document Revised:  07/22/2011 Document Reviewed: 05/30/2009 [facility] Patient Information 2012 [place], [place].

## 2012-02-10 NOTE — Discharge Summary (Signed)
Physician Discharge Summary  Patient ID: [name] [name] MRN: [id number] DOB/AGE: 1922-06-04 78 y.o.  Admit date: 02/06/2012 Discharge date: 02/10/2012  Admission Diagnoses:  *AV block, 2nd degree, atrial flutter with tachycardia/ bradycardia syndrome  Discharge Diagnoses:  Principal Problem:  *AV block, 2nd degree Active Problems:  HYPERLIPIDEMIA  THROMBOCYTOPENIA  HYPERTENSION, though recently has been hypotensive  BENIGN PROSTATIC HYPERTROPHY, WITH OBSTRUCTION  Memory loss  Lymphoma, history of  H/O colostomy  History of AAA (abdominal aortic aneurysm) repair  CKD (chronic kidney disease) stage 3, GFR 30-59 ml/min  A-fib   Discharged Condition: stable  Hospital Course:   78 y.o. male who presented to the Emergency Department complaining of moderate to severe weakness and fatigue onset 4 days prior to admission and progressively worsened.  He had associated bradycardia, SOB, DOE and pallor. Patient reported that he monitors heart rate via automatic blood pressure monitor. Denies chest pain, melena, abdominal pain, HA, swelling of lower extremities, nausea, vomiting, diarrhea, fever, chills, hematochezia. Stated he felt better lying down. Patient was recently advised to stop his exforge because of low BP recently as opposed to once daily as previously advised, but his BP started to increase so he takes it every other day. Additionally, patient noted that he had taken a full pill of Atenolol the past 2 days although he was previously taking only a half pill of Atenolol for the past couple of weeks. Was instructed to decrease to 12.5 mg atenolol daily.  In the ER he was in 2:1 AV Block with HR 38, pt stated at home it was 27.  Cardiac enzymes negative.   The patient was taken for pacemaker implant by Dr. Johney Frame for Mobitz II second degree AV block and atrial fibrillation with tachycardia/ bradycardia syndrome.  It was completed without complication.  No pneumothorax.  The device is a Electronics engineer DR RF dual-chamber pacemaker.  The patient developed atrial fib on 02/08/12.  He was scheduled for DCCV which was completed on 6/26.  The patient converted to NSR.  He will be started on Eliquis 2.5 mg BID beginning 6/29.  The patient was discharged home in stable condition after being seen by Dr. Allyson Sabal  Consults: EP  Significant Diagnostic Studies:  CHEST - 2 VIEW  Comparison: 02/06/2012  Findings: Interval placement of a left subclavian dual lead  pacemaker, leads extending to the right atrium and towards the  right ventricular apex. No pneumothorax. Lungs are clear. Mild  cardiomegaly. No effusion.  IMPRESSION:  1. Pacemaker placement without pneumothorax or other complication.   SURGEON: Hillis Range, MD  PREPROCEDURE DIAGNOSIS: Mobitz II second degree AV block, atrial fibrillation with tachycardia/ bradycardia syndrome  POSTPROCEDURE DIAGNOSIS: Mobitz II second degree AV block, atrial flutter with tachycardia/ bradycardia syndrome  PROCEDURES:  1. Pacemaker implantation.  INTRODUCTION: [name] [name] is a 78 y.o. male with a history of bradycardia who presents today for pacemaker implantation. The patient reports intermittent episodes of dizziness over the past few days. Upon evaluation, he was initially found to have complete heart block. This improved to 2:1 AV conduction. His atenolol was initially held. Due to subsequent afib with RVR it was No reversible causes have been identified. The patient therefore presents today for pacemaker implantation.  DESCRIPTION OF PROCEDURE: Informed written consent was obtained, and the patient was brought to the electrophysiology lab in a fasting state. The patient required no sedation for the procedure today. The patients left chest was prepped and draped in the usual sterile  fashion by the EP lab staff. The skin overlying the left deltopectoral region was infiltrated with lidocaine for local analgesia. A 4-cm incision was made  over the left deltopectoral region. A left subcutaneous pacemaker pocket was fashioned using a combination of sharp and blunt dissection. Electrocautery was required to assure hemostasis.  RA/RV Lead Placement:  The left axillary vein was cannulated. Through the left axillary vein, a [employer] model [id number] (serial number [id number]) right atrial lead and a [facility] model 1946- [birth year] (serial number [id number]) right ventricular lead were advanced with fluoroscopic visualization into the right atrial appendage and right ventricular apex positions respectively. The patient was in atrial fibrillation. Initial atrial lead fib- waves measured 1.2 mV with impedance of 452 ohms. Right ventricular lead R-waves measured 11 mV with an impedance of 758 ohms and a threshold of 0.5 V at 0.5 msec. Both leads were secured to the pectoralis fascia using #2-0 silk over the suture sleeves.  Device Placement:  The leads were then connected to a [employer] Accent DR RF model [id number] (serial number [id number] ) pacemaker. The pocket was irrigated with copious gentamicin solution. The pacemaker was then placed into the pocket. The pocket was then closed in 2 layers with 2.0 Vicryl suture for the subcutaneous and subcuticular layers. Steri- Strips and a sterile dressing were then applied. There were no early apparent complications.  CONCLUSIONS:  1. Successful implantation of a Industrial/product designer DR RF dual-chamber pacemaker for Mobitz II second degree AV block and atrial fibrillation with tachycardia/ bradycardia syndrome  2. No early apparent complications.  Hillis Range, MD  02/08/2012  5:45 PM     Treatments:  Dual Chamber PPM, DCCV  Discharge Exam: Blood pressure 136/58, pulse 77, temperature 98.4 F (36.9 C), temperature source Oral, resp. rate 20, height 5\' 4"  (1.626 m), weight 82.3 kg (181 lb 7 oz), SpO2 97.00%.   Disposition: 01-Home or Self Care  Discharge Orders    Future Appointments:  Provider: Department: Dept Phone: Center:   02/23/2012 3:15 PM Jac Canavan, PA [facility] [phone] [facility]     Future Orders Please Complete By Expires   Diet - low sodium heart healthy      Increase activity slowly      Discharge instructions      Comments:   No driving for one week.     Medication List  As of 02/10/2012  3:02 PM   TAKE these medications         amLODipine-valsartan 10-320 MG per tablet   Commonly known as: EXFORGE   Take 1 tablet by mouth every other day. Patient takes a 1/2 tablet every other day      aspirin 325 MG tablet   Take 325 mg by mouth daily.      atenolol 25 MG tablet   Commonly known as: TENORMIN   Take 25 mg by mouth daily.      escitalopram 20 MG tablet   Commonly known as: LEXAPRO   Take 1 tablet (20 mg total) by mouth daily.      HYDROcodone-acetaminophen 10-650 MG per tablet   Commonly known as: LORCET   Take 1 tablet by mouth daily.      meclizine 25 MG tablet   Commonly known as: ANTIVERT   Take 25 mg by mouth 2 (two) times daily.      NIASPAN 500 MG CR tablet   Generic drug: niacin   TAKE TWO TABLETS BY  MOUTH AT BEDTIME      RAPAFLO 8 MG Caps capsule   Generic drug: silodosin   Take 8 mg by mouth daily.      simvastatin 10 MG tablet   Commonly known as: ZOCOR   Take 1 tablet (10 mg total) by mouth at bedtime.           Eliquis 2.5 mg BID Start 02/12/12.  Follow-up Information    Follow up with Abelino Derrick, PA on 02/16/2012. (2 pm)    Contact information:   [address] [place] [address] [phone]          Signed: Wilburt Finlay 02/10/2012, 3:02 PM

## 2012-02-10 NOTE — Progress Notes (Signed)
Subjective:  No CP/SOB  Objective:  Temp:  [98 F (36.7 C)-99.1 F (37.3 C)] 98.3 F (36.8 C) (06/27 0525) Pulse Rate:  [67-78] 70  (06/27 0635) Resp:  [14-27] 15  (06/27 0635) BP: (99-151)/(52-78) 151/61 mmHg (06/27 0525) SpO2:  [95 %-100 %] 95 % (06/27 0635) Weight:  [82.3 kg (181 lb 7 oz)] 82.3 kg (181 lb 7 oz) (06/27 0018) Weight change: 2.1 kg (4 lb 10.1 oz)  Intake/Output from previous day: 06/26 0701 - 06/27 0700 In: 30 [I.V.:30] Out: 500 [Urine:500]  Intake/Output from this shift:    Physical Exam: General appearance: alert, cooperative, appears stated age and no distress Neck: no adenopathy, no carotid bruit, no JVD, supple, symmetrical, trachea midline and thyroid not enlarged, symmetric, no tenderness/mass/nodules Lungs: clear to auscultation bilaterally Heart: regular rate and rhythm, S1, S2 normal, no murmur, click, rub or gallop Extremities: extremities normal, atraumatic, no cyanosis or edema  Lab Results: Results for orders placed during the hospital encounter of 02/06/12 (from the past 48 hour(s))  GLUCOSE, CAPILLARY     Status: Abnormal   Collection Time   02/08/12  6:42 PM      Component Value Range Comment   Glucose-Capillary 130 (*) 70 - 99 mg/dL     Imaging: Imaging results have been reviewed  Assessment/Plan:   1. Principal Problem: 2.  *AV block, 2nd degree 3. Active Problems: 4.  HYPERLIPIDEMIA 5.  THROMBOCYTOPENIA 6.  HYPERTENSION, though recently has been hypotensive 7.  BENIGN PROSTATIC HYPERTROPHY, WITH OBSTRUCTION 8.  Memory loss 9.  Lymphoma, history of 10.  H/O colostomy 11.  History of AAA (abdominal aortic aneurysm) repair 12.  CKD (chronic kidney disease) stage 3, GFR 30-59 ml/min 13.  A-fib 14.   Time Spent Directly with Patient:  20 minutes  Length of Stay:  LOS: 4 days   Symptomatic brady with CHB, Mobitz second degree AVB s/p PTVPM insertion 2 days ago by Dr. Johney Frame, S/P DCCV yesterday successfully by Dr. Rennis Golden  to NSR. CXR OK. Labs OK. Exam benign. OK for D/C home today. Start Eliquis 2.5 mg PO BID on Sat. F/U with Dr. Salena Saner 2 weeks.  Runell Gess 02/10/2012, 8:06 AM

## 2012-02-11 ENCOUNTER — Other Ambulatory Visit: Payer: Self-pay | Admitting: Medical

## 2012-02-11 ENCOUNTER — Encounter: Payer: Self-pay | Admitting: Internal Medicine

## 2012-02-11 ENCOUNTER — Telehealth: Payer: Self-pay | Admitting: Family Medicine

## 2012-02-11 ENCOUNTER — Encounter (HOSPITAL_COMMUNITY): Payer: Self-pay | Admitting: Internal Medicine

## 2012-02-11 MED ORDER — COLCHICINE 0.6 MG PO TABS: 0.6000 mg | ORAL_TABLET | Freq: Two times a day (BID) | ORAL | Status: DC

## 2012-02-11 NOTE — Telephone Encounter (Signed)
i have called and am awaiting msg from cardiology to discuss medication options for gout given his recent pacemaker.  I just want to check with them before prescribing something.   How is he feeling in general?

## 2012-02-11 NOTE — Telephone Encounter (Signed)
Opal called pt has GOUT in his right wrist.  She wants something called in.  Pt too weak to come in just got out of the hospital yesterday.  He didn't mention to them that he had gout.    [facility] in [place].  Please let pt know if you will call something in   [place] [phone]

## 2012-02-11 NOTE — Telephone Encounter (Signed)
Did they change any of his meds.  I see from records he had pacemaker put in.  What has he taken prescription? In the past for gout?

## 2012-02-11 NOTE — Telephone Encounter (Signed)
Patient's wife states that they didn't change any of his medications but they added a new one but she doesn't remember the name of the medication. She states that she doesn't know what medications he has used in the past. CLS

## 2012-02-14 NOTE — Telephone Encounter (Signed)
I CALLED AND I SPOKE WITH THE PATIENTS WIFE TO LET HER KNOW SHANE TYSINGER PA-C IS AWAITING THE PHONE CALL FROM THE CARDIOLOGISTS. THE WIFE STATES THAT THE PATIENT IS DOING OKAY. CLS

## 2012-02-23 ENCOUNTER — Encounter: Payer: Self-pay | Admitting: Internal Medicine

## 2012-02-23 ENCOUNTER — Ambulatory Visit: Payer: Medicare Other | Admitting: Medical

## 2012-02-23 ENCOUNTER — Ambulatory Visit (INDEPENDENT_AMBULATORY_CARE_PROVIDER_SITE_OTHER): Payer: Medicare Other | Admitting: *Deleted

## 2012-02-23 DIAGNOSIS — I441 Atrioventricular block, second degree: Secondary | ICD-10-CM

## 2012-02-23 LAB — PACEMAKER DEVICE OBSERVATION
BAMS-0001: 150 {beats}/min
BAMS-0003: 70 {beats}/min
BATTERY VOLTAGE: 2.993 V
VENTRICULAR PACING PM: 78

## 2012-02-23 NOTE — Progress Notes (Signed)
Wound check-PPM 

## 2012-02-28 ENCOUNTER — Encounter: Payer: Self-pay | Admitting: Medical

## 2012-02-28 ENCOUNTER — Ambulatory Visit (INDEPENDENT_AMBULATORY_CARE_PROVIDER_SITE_OTHER): Payer: Medicare Other | Admitting: Medical

## 2012-02-28 VITALS — BP 100/58 | HR 76 | Temp 98.1°F | Resp 16 | Wt 181.0 lb

## 2012-02-28 DIAGNOSIS — I441 Atrioventricular block, second degree: Secondary | ICD-10-CM

## 2012-02-28 DIAGNOSIS — I10 Essential (primary) hypertension: Secondary | ICD-10-CM

## 2012-02-28 DIAGNOSIS — I4891 Unspecified atrial fibrillation: Secondary | ICD-10-CM

## 2012-02-28 DIAGNOSIS — Z950 Presence of cardiac pacemaker: Secondary | ICD-10-CM

## 2012-02-28 DIAGNOSIS — R5383 Other fatigue: Secondary | ICD-10-CM

## 2012-02-28 MED ORDER — APIXABAN 5 MG PO TABS: 2.5000 mg | ORAL_TABLET | Freq: Two times a day (BID) | ORAL | Status: DC

## 2012-02-28 MED ORDER — AMLODIPINE BESYLATE-VALSARTAN 5-160 MG PO TABS: 1.0000 | ORAL_TABLET | Freq: Every day | ORAL | Status: DC

## 2012-02-28 NOTE — Progress Notes (Signed)
Subjective: [name] [name] is here today in post hospital f/u.  He recently went to the ED in late June with weakness and fatigue, had very low heart rate, and ended up being cardioverted for new onset Afib.  I had seen him weeks before when he was having lower than normal blood pressure.  We had discussed stopping Exforge altogether, but continued to take the Exforge as he felt that BPs were running high most of the time.  He continued to take 1/2 tablet of Atenolol daily.  Had pacemaker placed.  He is doing relatively well, but still feels quite tired and generalized weakness.  He has no new problems otherwise.  Since the hospitalization, he has continued Exforge 1/2 tablet daily and Atenolol 1/2 tablet daily.  He was started on medication to reduce risk of clots.  No other c/o.   Past Medical History  Diagnosis Date  . Depression   . Aneurysm of abdominal aorta     [facility], Dr. Royann Shivers  . History of colon cancer     hospitalization 1999  . Degenerative disc disease, lumbar   . History of non-Hodgkin's lymphoma     hospitalization 2002  . Cataract   . Hard of hearing     wears hearing aids  . Hypertension   . Hyperlipidemia   . Thrombocytopenia     Dr. Darnelle Catalan  . Chronic renal disease   . Memory loss   . Hyperglycemia   . Edema   . Heart murmur     Echocardiogram 7/10, stress test 2008, [facility]  . Overactive bladder   . Anemia     Dr. Darnelle Catalan  . AV block, 2nd degree 02/06/2012  . Lymphoma, history of 02/06/2012  . H/O colostomy 02/06/2012  . History of AAA (abdominal aortic aneurysm) repair 02/06/2012  . CKD (chronic kidney disease) stage 3, GFR 30-59 ml/min 02/06/2012  . Atrial fibrillation 01/2012    cardioversion to NSR  . Pacemaker 02/08/2012    [employer] Accent DR RF dual-chamber pacemaker. Dr. Hillis Range   Past Surgical History  Procedure Date  . Colostomy   . Cardioversion 02/09/2012    Procedure: CARDIOVERSION;  Surgeon: Chrystie Nose, MD;  [facility];  Service:  Cardiovascular;  Laterality: N/A;  . Pacemaker placement 02/08/2012    Dr. Hillis Range    Objective: Gen: wd, wn, nad Skin: no warmth or erythema of pacemaker site, but there is some slight yellowish/brown ecchymosis suggestive of healing bruises Heart: RRR, no murmurs or gallops, no obvious Afib today Lungs: clear Pulses: 2+ symmetric  Assessment: Encounter Diagnoses  Name Primary?  . Essential hypertension, benign Yes  . Pacemaker   . A-fib   . AV block, Mobitz 2   . Fatigue    HTN - I decreased the Exforge to 5/160mg  daily. C/t Atenolol 25mg , 1/2 tablet daily.  F/u with cardiology in August as planned and for further management of his BP medications.    Pacemaker - doing well since placement.  Heart rate regular today.  F/u with cardiology in August.  Afib and AV block - nsr since cardioversion and pacemaker placement.  Reviewed the recent hospital and cardiology notes. C/t Eliquis.  Refills today on this. [place] score 2.    Fatigue - multifactorial.  HE has hypogonadism but has not been able to tolerate testosterone therapy. He felt more energy on TST, but this aggravated his BPH.  Advised he take multivitamin and Vit D daily.  His mood seems to be  ok, in good spirits.    F/u with cardiology in August.

## 2012-02-28 NOTE — Patient Instructions (Addendum)
Continue the Eliquis 5mg , 1/2 tablet twice daily for clot prevention.  Continue Atenolol 25mg , 1/2 tablet daily.  I decreased your Exforge to 5/160mg , 1 tablet daily.  This is instead of taking 1/2 tablet every day.  This will be more in line with what you have been doing with your medication.  Continue the rest of your medication as usual.  I do recommend you take a daily Vitamin D 1000 IU over the counter, and take a multivitamin daily.   Your next cardiology appointment is August 7, at 3:45pm.

## 2012-02-29 ENCOUNTER — Telehealth: Payer: Self-pay | Admitting: Medical

## 2012-02-29 NOTE — Telephone Encounter (Signed)
LM

## 2012-03-08 ENCOUNTER — Telehealth: Payer: Self-pay | Admitting: Internal Medicine

## 2012-03-08 NOTE — Telephone Encounter (Signed)
Per patient wife [name] [name] [phone] , pt hd a pacer placed 6/25, c/o of elevated BP today. Please return call to advise patient.

## 2012-03-08 NOTE — Telephone Encounter (Signed)
Not really concerned with his BP  It is more the HR of 103.  It has gone back to normal now. Dr Aleen Campi follows his for BP.  I let the wife know that if his HR continues to do this she can call us back and we can increase the Atenolol if needed as he has the PPM to support bradycardia.  He may have had a run of afib

## 2012-03-09 ENCOUNTER — Telehealth: Payer: Self-pay | Admitting: Internal Medicine

## 2012-03-14 ENCOUNTER — Encounter (HOSPITAL_COMMUNITY): Payer: Self-pay

## 2012-03-14 ENCOUNTER — Emergency Department (HOSPITAL_COMMUNITY)
Admission: EM | Admit: 2012-03-14 | Discharge: 2012-03-14 | Disposition: A | Discharge status: Home / Self Care | Payer: Medicare Other | Attending: Emergency Medicine | Admitting: Emergency Medicine

## 2012-03-14 ENCOUNTER — Emergency Department (HOSPITAL_COMMUNITY): Payer: Medicare Other

## 2012-03-14 DIAGNOSIS — Z7982 Long term (current) use of aspirin: Secondary | ICD-10-CM | POA: Insufficient documentation

## 2012-03-14 DIAGNOSIS — F3289 Other specified depressive episodes: Secondary | ICD-10-CM | POA: Insufficient documentation

## 2012-03-14 DIAGNOSIS — Z79899 Other long term (current) drug therapy: Secondary | ICD-10-CM | POA: Insufficient documentation

## 2012-03-14 DIAGNOSIS — N183 Chronic kidney disease, stage 3 unspecified: Secondary | ICD-10-CM | POA: Insufficient documentation

## 2012-03-14 DIAGNOSIS — I4891 Unspecified atrial fibrillation: Secondary | ICD-10-CM | POA: Insufficient documentation

## 2012-03-14 DIAGNOSIS — J40 Bronchitis, not specified as acute or chronic: Secondary | ICD-10-CM

## 2012-03-14 DIAGNOSIS — F329 Major depressive disorder, single episode, unspecified: Secondary | ICD-10-CM | POA: Insufficient documentation

## 2012-03-14 DIAGNOSIS — Z87891 Personal history of nicotine dependence: Secondary | ICD-10-CM | POA: Insufficient documentation

## 2012-03-14 DIAGNOSIS — M51379 Other intervertebral disc degeneration, lumbosacral region without mention of lumbar back pain or lower extremity pain: Secondary | ICD-10-CM | POA: Insufficient documentation

## 2012-03-14 DIAGNOSIS — I129 Hypertensive chronic kidney disease with stage 1 through stage 4 chronic kidney disease, or unspecified chronic kidney disease: Secondary | ICD-10-CM | POA: Insufficient documentation

## 2012-03-14 DIAGNOSIS — Z85038 Personal history of other malignant neoplasm of large intestine: Secondary | ICD-10-CM | POA: Insufficient documentation

## 2012-03-14 DIAGNOSIS — E785 Hyperlipidemia, unspecified: Secondary | ICD-10-CM | POA: Insufficient documentation

## 2012-03-14 DIAGNOSIS — M5137 Other intervertebral disc degeneration, lumbosacral region: Secondary | ICD-10-CM | POA: Insufficient documentation

## 2012-03-14 LAB — COMPREHENSIVE METABOLIC PANEL
Alkaline Phosphatase: 75 U/L (ref 39–117)
BUN: 21 mg/dL (ref 6–23)
Calcium: 9.3 mg/dL (ref 8.4–10.5)
Creatinine, Ser: 1.48 mg/dL — ABNORMAL HIGH (ref 0.50–1.35)
GFR calc Af Amer: 47 mL/min — ABNORMAL LOW (ref >90)
Glucose, Bld: 153 mg/dL — ABNORMAL HIGH (ref 70–99)
Total Protein: 6.6 g/dL (ref 6.0–8.3)

## 2012-03-14 LAB — CBC WITH DIFFERENTIAL/PLATELET
Eosinophils Absolute: 0.1 10*3/uL (ref 0.0–0.7)
Eosinophils Relative: 1 % (ref 0–5)
Hemoglobin: 10.5 g/dL — ABNORMAL LOW (ref 13.0–17.0)
Lymphs Abs: 0.7 10*3/uL (ref 0.7–4.0)
MCH: 32 pg (ref 26.0–34.0)
MCHC: 33.9 g/dL (ref 30.0–36.0)
MCV: 94.5 fL (ref 78.0–100.0)
Monocytes Absolute: 1.1 10*3/uL — ABNORMAL HIGH (ref 0.1–1.0)
Monocytes Relative: 12 % (ref 3–12)
RBC: 3.28 MIL/uL — ABNORMAL LOW (ref 4.22–5.81)

## 2012-03-14 MED ORDER — AZITHROMYCIN 250 MG PO TABS: ORAL_TABLET | ORAL | Status: DC

## 2012-03-14 NOTE — ED Notes (Signed)
Pt c/o cough w/ yellow sputum x 4 days. Pt denies difficulty breathing.

## 2012-03-14 NOTE — ED Provider Notes (Cosign Needed)
History  This chart was scribed for [name] Lennert, MD by Bennett Scrape. This patient was seen in room APA06/APA06 and the patient's care was started at 2:30PM.  CSN: [id number]  Arrival date & time 03/14/12  1416   First MD Initiated Contact with Patient 03/14/12 1430      Chief Complaint  Patient presents with  . Cough    Patient is a 78 y.o. male presenting with cough. The history is provided by the patient. No language interpreter was used.  Cough This is a new problem. The current episode started more than 2 days ago. The problem occurs constantly. The problem has been gradually worsening. The cough is productive of sputum. There has been no fever. Pertinent negatives include no chest pain, no headaches and no shortness of breath. He is not a smoker. His past medical history does not include COPD or asthma.    [name] [name] is a 78 y.o. male who presents to the Emergency Department complaining of one day of gradual onset, gradual worsening, constant cough productive of yellow sputum with occasional SOB. He denies having any modifying factors. He reports taking nightquil and dayquil with no improvement. He has not seen a MD for the symptoms prior to today.  He reports that he has chronic trouble walking and uses a cane occasionally. He denies fever, sore throat, visual disturbance, CP,  abdominal pain, nausea, emesis, diarrhea, urinary symptoms, back pain, HA, weakness, numbness and rash as associated symptoms.  He has a h/o depression, DDD, HTN, HLD, and A. Fib. He is an occasional alcohol user and is a former smoker.   Dr. Chales Abrahams in [place], [place].   Past Medical History  Diagnosis Date  . Depression   . Aneurysm of abdominal aorta     [facility], Dr. Royann Shivers  . History of colon cancer     hospitalization 1999  . Degenerative disc disease, lumbar   . History of non-Hodgkin's lymphoma     hospitalization 2002  . Cataract   . Hard of hearing     wears hearing aids  .  Hypertension   . Hyperlipidemia   . Thrombocytopenia     Dr. Darnelle Catalan  . Chronic renal disease   . Memory loss   . Hyperglycemia   . Edema   . Heart murmur     Echocardiogram 7/10, stress test 2008, [facility]  . Overactive bladder   . Anemia     Dr. Darnelle Catalan  . AV block, 2nd degree 02/06/2012  . Lymphoma, history of 02/06/2012  . H/O colostomy 02/06/2012  . History of AAA (abdominal aortic aneurysm) repair 02/06/2012  . CKD (chronic kidney disease) stage 3, GFR 30-59 ml/min 02/06/2012  . Atrial fibrillation 01/2012    cardioversion to NSR  . Pacemaker 02/08/2012    St Jude Medical Accent DR RF dual-chamber pacemaker. Dr. Hillis Range    Past Surgical History  Procedure Date  . Colostomy 1999  . Cardioversion 02/09/2012    Procedure: CARDIOVERSION;  Surgeon: Chrystie Nose, MD;  Location: [facility] OR;  Service: Cardiovascular;  Laterality: N/A;  . Pacemaker placement 02/08/2012    Dr. Hillis Range    History reviewed. No pertinent family history.  History  Substance Use Topics  . Smoking status: Former Smoker    Quit date: 08/17/1967  . Smokeless tobacco: Never Used  . Alcohol Use: 0.6 oz/week    1 Cans of beer per week      Review of Systems  Constitutional: Negative for  fatigue.  HENT: Negative for congestion, sinus pressure and ear discharge.   Eyes: Negative for discharge.  Respiratory: Positive for cough. Negative for shortness of breath.   Cardiovascular: Negative for chest pain.  Gastrointestinal: Negative for abdominal pain and diarrhea.  Genitourinary: Negative for frequency and hematuria.  Musculoskeletal: Negative for back pain.  Skin: Negative for rash.  Neurological: Negative for seizures and headaches.  Hematological: Negative.   Psychiatric/Behavioral: Negative for hallucinations.    Allergies  Review of patient's allergies indicates no known allergies.  Home Medications   Current Outpatient Rx  Name Route Sig Dispense Refill  . AMLODIPINE  BESYLATE-VALSARTAN 5-160 MG PO TABS Oral Take 1 tablet by mouth daily. 30 tablet 2  . APIXABAN 5 MG PO TABS Oral Take 0.5 tablets (2.5 mg total) by mouth 2 (two) times daily. 60 tablet 2  . ASPIRIN 325 MG PO TABS Oral Take 325 mg by mouth daily.      . ATENOLOL 25 MG PO TABS Oral Take 25 mg by mouth daily.      . COLCHICINE 0.6 MG PO TABS Oral Take 1 tablet (0.6 mg total) by mouth 2 (two) times daily. 30 tablet 0  . ESCITALOPRAM OXALATE 20 MG PO TABS Oral Take 1 tablet (20 mg total) by mouth daily. 90 tablet 1  . HYDROCODONE-ACETAMINOPHEN 10-650 MG PO TABS Oral Take 1 tablet by mouth daily.    Marland Kitchen MECLIZINE HCL 25 MG PO TABS Oral Take 25 mg by mouth as needed.     Marland Kitchen NIASPAN 500 MG PO TBCR  TAKE TWO TABLETS BY MOUTH AT BEDTIME 60 each 5  . SILODOSIN 8 MG PO CAPS Oral Take 8 mg by mouth daily.     Marland Kitchen SIMVASTATIN 10 MG PO TABS Oral Take 20 mg by mouth at bedtime.      Triage Vitals: BP 100/50  Pulse 85  Temp 98.4 F (36.9 C) (Oral)  Resp 18  Ht 5\' 4"  (1.626 m)  Wt 170 lb (77.111 kg)  BMI 29.18 kg/m2  SpO2 98%  Physical Exam  Nursing note and vitals reviewed. Constitutional: He is oriented to person, place, and time. He appears well-developed and well-nourished.  HENT:  Head: Normocephalic and atraumatic.  Eyes: Conjunctivae and EOM are normal. No scleral icterus.  Neck: Neck supple. No thyromegaly present.  Cardiovascular: Normal rate and regular rhythm.  Exam reveals no gallop and no friction rub.   No murmur heard. Pulmonary/Chest: Effort normal and breath sounds normal. No stridor. He has no wheezes. He has no rales. He exhibits no tenderness.  Abdominal: Soft. Bowel sounds are normal. He exhibits no distension. There is no tenderness. There is no rebound.       ostomy bag in LLQ  Musculoskeletal: Normal range of motion. He exhibits no edema.  Lymphadenopathy:    He has no cervical adenopathy.  Neurological: He is alert and oriented to person, place, and time. Coordination normal.    Skin: Skin is warm and dry. No rash noted. No erythema.  Psychiatric: He has a normal mood and affect. His behavior is normal.    ED Course  Procedures (including critical care time)  DIAGNOSTIC STUDIES: Oxygen Saturation is 98% on room air, normal by my interpretation.    COORDINATION OF CARE: 2:39PM-Discussed treatment plan which x-ray and blood work with pt at bedside and pt agreed to plan.  Labs Reviewed - No data to display No results found.   No diagnosis found.    MDM  The chart was scribed for me under my direct supervision.  I personally performed the history, physical, and medical decision making and all procedures in the evaluation of this patient..    Date: 03/27/2012  Rate: 75  Rhythm: normal sinus rhythm  QRS Axis: normal  Intervals: normal  ST/T Wave abnormalities: nonspecific ST changes  Inverted t waves inf.  Conduction Disutrbances:none  Narrative Interpretation:   Old EKG Reviewed: none available    [name] Lennert, MD 03/14/12 1647  [name] Lennert, MD 03/27/12 [phone]

## 2012-03-14 NOTE — ED Notes (Signed)
Discharge instructions reviewed.

## 2012-03-17 ENCOUNTER — Other Ambulatory Visit: Payer: Self-pay | Admitting: Medical

## 2012-03-17 NOTE — Telephone Encounter (Signed)
Rx refill Lexapro.

## 2012-03-22 ENCOUNTER — Other Ambulatory Visit: Payer: Self-pay | Admitting: Medical

## 2012-03-22 ENCOUNTER — Telehealth: Payer: Self-pay | Admitting: Family Medicine

## 2012-03-22 NOTE — Telephone Encounter (Signed)
Is this ok?

## 2012-03-22 NOTE — Telephone Encounter (Signed)
Rx refill

## 2012-03-22 NOTE — Telephone Encounter (Signed)
Pt has appt 8/8

## 2012-03-22 NOTE — Telephone Encounter (Signed)
Have him set up an appointment for followup on

## 2012-03-22 NOTE — Telephone Encounter (Signed)
Called pt t/w Opal, she states Braydin is doing much better.  I advised her if they needed [place] to call.

## 2012-03-23 ENCOUNTER — Ambulatory Visit: Payer: Medicare Other | Admitting: Family Medicine

## 2012-03-27 ENCOUNTER — Ambulatory Visit (INDEPENDENT_AMBULATORY_CARE_PROVIDER_SITE_OTHER): Payer: Medicare Other | Admitting: Family Medicine

## 2012-03-27 ENCOUNTER — Encounter: Payer: Self-pay | Admitting: Family Medicine

## 2012-03-27 VITALS — BP 110/60 | HR 60 | Wt 181.0 lb

## 2012-03-27 DIAGNOSIS — N400 Enlarged prostate without lower urinary tract symptoms: Secondary | ICD-10-CM

## 2012-03-27 DIAGNOSIS — N318 Other neuromuscular dysfunction of bladder: Secondary | ICD-10-CM

## 2012-03-27 DIAGNOSIS — N401 Enlarged prostate with lower urinary tract symptoms: Secondary | ICD-10-CM

## 2012-03-27 DIAGNOSIS — I10 Essential (primary) hypertension: Secondary | ICD-10-CM

## 2012-03-27 DIAGNOSIS — M549 Dorsalgia, unspecified: Secondary | ICD-10-CM

## 2012-03-27 DIAGNOSIS — G8929 Other chronic pain: Secondary | ICD-10-CM

## 2012-03-27 DIAGNOSIS — R42 Dizziness and giddiness: Secondary | ICD-10-CM

## 2012-03-27 MED ORDER — ESCITALOPRAM OXALATE 20 MG PO TABS: 20.0000 mg | ORAL_TABLET | Freq: Every day | ORAL | Status: DC

## 2012-03-27 MED ORDER — TRAMADOL HCL 50 MG PO TABS: 50.0000 mg | ORAL_TABLET | Freq: Three times a day (TID) | ORAL | Status: DC | PRN

## 2012-03-27 MED ORDER — NIACIN ER (ANTIHYPERLIPIDEMIC) 500 MG PO TBCR: 500.0000 mg | EXTENDED_RELEASE_TABLET | Freq: Every day | ORAL | Status: AC

## 2012-03-27 NOTE — Progress Notes (Signed)
  Subjective:    Patient ID: [name] [name], male    DOB: 1922/02/18, 78 y.o.   MRN: [id number]  HPI He is here for a recheck. He was seen recently in the emergency room and given an antibiotic. The chest x-ray did not show any evidence of pneumonia. He does state he is feeling much better and having less coughing. He does take codeine for his chronic back pain. He also states that he occasionally does have dizziness but blames on low blood pressure. He does check his blood pressures fairly regularly. He does need some of his medications renewed however he is unsure which ones.   Review of Systems     Objective:   Physical Exam Alert and in no distress. Cardiac exam shows regular rhythm without murmurs or gallops. Lungs are clear to auscultation.       Assessment & Plan:   1. Chronic back pain   2. OVERACTIVE BLADDER   3. BENIGN PROSTATIC HYPERTROPHY, WITH OBSTRUCTION   4. BPH (benign prostatic hyperplasia)   5. Dizziness   6. HYPERTENSION, though recently has been hypotensive    I will switch him to tramadol see if this will help with his pain to trying him off the codeine . Will also hold his amlodipine since there is question of low blood pressure on occasion. He will renew some of his other medications.

## 2012-04-17 ENCOUNTER — Other Ambulatory Visit: Payer: Self-pay | Admitting: Family Medicine

## 2012-04-18 ENCOUNTER — Telehealth: Payer: Self-pay | Admitting: Internal Medicine

## 2012-04-18 NOTE — Telephone Encounter (Signed)
Tramadol renewed for his chronic back pain

## 2012-04-18 NOTE — Telephone Encounter (Signed)
IS THIS OK 

## 2012-04-19 NOTE — Telephone Encounter (Signed)
done

## 2012-05-01 ENCOUNTER — Telehealth: Payer: Self-pay | Admitting: Family Medicine

## 2012-05-01 MED ORDER — SILODOSIN 8 MG PO CAPS: 8.0000 mg | ORAL_CAPSULE | ORAL | Status: DC

## 2012-05-01 NOTE — Telephone Encounter (Signed)
PT'S WIFE OPAL CALLED AND STATED THE THE PT'S UROLOGIST HAD CLOSED HIS PRACTICE. SHE ALSO STATED THAT SHE DIDN'T KNOW WHO HAD TAKEN THAT OVER. I CALLED [facility] AND WAS TOLD THAT HE HAD LEFT HIS PRACTICE. DR. Beatris Ship WAS THE NAME OF HIS PHYSICIAN AND SOMEONE TO TAKE OVER THAT PRACTICE WOULD NOT BE IN UNTIL November. PT NEEDS A REFILL ON MEDICATION THAT WAS PRESCRIBED BY DR. Baldo Ash.  PT NEEDS RAPAFLO 8MG  HE TAKES IT ONCE A DAY. THERE ARE REQUESTING THAT HE GET A 90 DAY SUPPLY. PT USES [facility] IN [place]. PLEASE CALL PT'S WIFE AND INFORM IF WE CAN FILL THIS FOR HIM UNTIL THEY CAN FIND A NEW UROLOGIST.

## 2012-05-08 ENCOUNTER — Other Ambulatory Visit: Payer: Self-pay | Admitting: Medical

## 2012-05-08 MED ORDER — SILODOSIN 8 MG PO CAPS: 8.0000 mg | ORAL_CAPSULE | ORAL | Status: DC

## 2012-05-08 NOTE — Telephone Encounter (Signed)
PATIENTS WIFE WAS NOTIFIED OF WHAT SHANE TYSINGER SUGGESTED FOR THE FOLLOW UP APPOINTMENT. CLS

## 2012-05-08 NOTE — Telephone Encounter (Signed)
Refill on rapaflo sent.  If he is not having any current urinary troubles, then we can follow up on enlarged prostate/rapaflo at next visit.

## 2012-05-11 ENCOUNTER — Telehealth: Payer: Self-pay | Admitting: *Deleted

## 2012-05-11 NOTE — Telephone Encounter (Signed)
Called patient home number it is the [employer] in [place] mailed out patient's calendar to inform the patient of the new date and time

## 2012-05-26 ENCOUNTER — Telehealth: Payer: Self-pay | Admitting: Medical

## 2012-05-29 ENCOUNTER — Encounter: Payer: Self-pay | Admitting: Internal Medicine

## 2012-05-29 ENCOUNTER — Ambulatory Visit (INDEPENDENT_AMBULATORY_CARE_PROVIDER_SITE_OTHER): Payer: Medicare Other | Admitting: Internal Medicine

## 2012-05-29 VITALS — BP 98/50 | HR 76 | Ht 64.5 in | Wt 186.0 lb

## 2012-05-29 DIAGNOSIS — I4891 Unspecified atrial fibrillation: Secondary | ICD-10-CM

## 2012-05-29 DIAGNOSIS — I441 Atrioventricular block, second degree: Secondary | ICD-10-CM

## 2012-05-29 LAB — PACEMAKER DEVICE OBSERVATION
AL THRESHOLD: 0.625 V
DEVICE MODEL PM: 7354973
RV LEAD IMPEDENCE PM: 700 Ohm
RV LEAD THRESHOLD: 0.625 V

## 2012-05-29 NOTE — Progress Notes (Signed)
PCP: Carollee Herter, MD Primary Cardiologist:  [facility]  [name] [name] is a 78 y.o. male who presents today for electrophysiology followup.  Since his pacemaker was implanted, the patient reports doing well.  He denies procedure related complications.  Today, he denies symptoms of palpitations, chest pain, shortness of breath,  lower extremity edema, dizziness, presyncope, or syncope.  The patient is otherwise without complaint today.   Past Medical History  Diagnosis Date  . Depression   . Aneurysm of abdominal aorta     [facility], Dr. Royann Shivers  . History of colon cancer     hospitalization 1999  . Degenerative disc disease, lumbar   . History of non-Hodgkin's lymphoma     hospitalization 2002  . Cataract   . Hard of hearing     wears hearing aids  . Hypertension   . Hyperlipidemia   . Thrombocytopenia     Dr. Darnelle Catalan  . Chronic renal disease   . Memory loss   . Hyperglycemia   . Edema   . Heart murmur     Echocardiogram 7/10, stress test 2008, [facility]  . Overactive bladder   . Anemia     Dr. Darnelle Catalan  . AV block, 2nd degree 02/06/2012  . Lymphoma, history of 02/06/2012  . H/O colostomy 02/06/2012  . History of AAA (abdominal aortic aneurysm) repair 02/06/2012  . CKD (chronic kidney disease) stage 3, GFR 30-59 ml/min 02/06/2012  . Atrial fibrillation 01/2012    cardioversion to NSR  . Pacemaker 02/08/2012    St Jude Medical Accent DR RF dual-chamber pacemaker. Dr. Hillis Range   Past Surgical History  Procedure Date  . Colostomy   . Cardioversion 02/09/2012    Procedure: CARDIOVERSION;  Surgeon: Chrystie Nose, MD;  Location: [facility] OR;  Service: Cardiovascular;  Laterality: N/A;  . Pacemaker placement 02/08/2012    Dr. Hillis Range ([facility])    Current Outpatient Prescriptions  Medication Sig Dispense Refill  . amLODipine-valsartan (EXFORGE) 5-160 MG per tablet Take 1 tablet by mouth every evening.      Marland Kitchen apixaban (ELIQUIS) 5 MG TABS tablet Take 0.5 tablets (2.5 mg total) by  mouth 2 (two) times daily.  60 tablet  2  . aspirin 325 MG tablet Take 325 mg by mouth every morning.       Marland Kitchen atenolol (TENORMIN) 25 MG tablet Take 25 mg by mouth every evening.       Marland Kitchen azithromycin (ZITHROMAX Z-PAK) 250 MG tablet Take two initially then one every day for five day  6 tablet  0  . escitalopram (LEXAPRO) 20 MG tablet Take 1 tablet (20 mg total) by mouth daily.  30 tablet  5  . HYDROcodone-acetaminophen (LORCET) 10-650 MG per tablet Take 1 tablet by mouth every 4 (four) hours as needed.       . meclizine (ANTIVERT) 25 MG tablet Take 25 mg by mouth daily as needed.       . niacin (NIASPAN) 500 MG CR tablet Take 1 tablet (500 mg total) by mouth at bedtime.  30 tablet  11  . silodosin (RAPAFLO) 8 MG CAPS capsule Take 1 capsule (8 mg total) by mouth every morning.  90 capsule  2  . simvastatin (ZOCOR) 20 MG tablet Take 20 mg by mouth every evening.      . traMADol (ULTRAM) 50 MG tablet TAKE ONE TABLET BY MOUTH EVERY 8 HOURS AS NEEDED FOR PAIN  30 tablet  5    Physical Exam: Filed Vitals:  05/29/12 1646  BP: 98/50  Pulse: 76  Height: 5' 4.5" (1.638 m)  Weight: 186 lb (84.369 kg)    GEN- The patient is well appearing, alert and oriented x 3 today.   Head- normocephalic, atraumatic Eyes-  Sclera clear, conjunctiva pink Ears- hearing intact Oropharynx- clear Lungs- Clear to ausculation bilaterally, normal work of breathing Chest- pacemaker pocket is well healed Heart- Regular rate and rhythm  GI- soft, NT, ND, + BS Extremities- no clubbing, cyanosis, or edema  Pacemaker interrogation- reviewed in detail today,  See PACEART report  Assessment and Plan:  1. Bradycardia Normal pacemaker function See Pace Art report No changes today   2. Afib Maintaining sinus rhythm No changes today Would consider stopping ASA and continuing eliquis as recent data suggests increased bleeding without significant benefit with ASA in combination with anticoagulation.  I will defer this  to his primary cardiologists at [facility].  I have spoken with Dr Royann Shivers who will arrange pacemaker follow-up going forward. I will see the patient as needed

## 2012-05-29 NOTE — Telephone Encounter (Signed)
Have him set up an appointment so we can discuss the dosing regimen

## 2012-05-29 NOTE — Telephone Encounter (Signed)
Talked with Opal and scheduled appt

## 2012-05-31 ENCOUNTER — Other Ambulatory Visit: Payer: Self-pay | Admitting: Oncology

## 2012-05-31 ENCOUNTER — Other Ambulatory Visit: Payer: Medicare Other | Admitting: Lab

## 2012-06-01 ENCOUNTER — Encounter: Payer: Self-pay | Admitting: Family Medicine

## 2012-06-01 ENCOUNTER — Ambulatory Visit (INDEPENDENT_AMBULATORY_CARE_PROVIDER_SITE_OTHER): Payer: Medicare Other | Admitting: Family Medicine

## 2012-06-01 VITALS — BP 116/70 | HR 70 | Wt 182.0 lb

## 2012-06-01 DIAGNOSIS — Z23 Encounter for immunization: Secondary | ICD-10-CM

## 2012-06-01 DIAGNOSIS — E291 Testicular hypofunction: Secondary | ICD-10-CM

## 2012-06-01 DIAGNOSIS — M545 Low back pain, unspecified: Secondary | ICD-10-CM

## 2012-06-01 MED ORDER — INFLUENZA VIRUS VACC SPLIT PF IM SUSP: 0.5000 mL | Freq: Once | INTRAMUSCULAR | Status: DC

## 2012-06-01 MED ORDER — TRAMADOL HCL 50 MG PO TABS: 50.0000 mg | ORAL_TABLET | Freq: Four times a day (QID) | ORAL | Status: DC | PRN

## 2012-06-01 MED ORDER — TESTOSTERONE 20.25 MG/ACT (1.62%) TD GEL: 2.0000 "application " | TRANSDERMAL | Status: DC

## 2012-06-01 NOTE — Patient Instructions (Signed)
You can take tramadol 4 times per day and also take 2 Tylenol with them. Let us know how that works.

## 2012-06-07 ENCOUNTER — Ambulatory Visit (HOSPITAL_BASED_OUTPATIENT_CLINIC_OR_DEPARTMENT_OTHER): Payer: Medicare Other | Admitting: Oncology

## 2012-06-07 VITALS — BP 91/54 | HR 85 | Temp 97.0°F | Resp 20 | Ht 64.5 in | Wt 182.5 lb

## 2012-06-07 DIAGNOSIS — C8583 Other specified types of non-Hodgkin lymphoma, intra-abdominal lymph nodes: Secondary | ICD-10-CM

## 2012-06-07 DIAGNOSIS — Z85048 Personal history of other malignant neoplasm of rectum, rectosigmoid junction, and anus: Secondary | ICD-10-CM

## 2012-06-07 DIAGNOSIS — C19 Malignant neoplasm of rectosigmoid junction: Secondary | ICD-10-CM

## 2012-06-07 NOTE — Progress Notes (Signed)
ID: [name] [name]   DOB: [birth year]  MR#: [id number]  CSN#:[id number]  PCP: Carollee Herter, MD GYN:  SU:  OTHER MD:  INTERVAL HISTORY: [name] [name] returns today for followup of his remote non-Hodgkin's lymphoma and rectal cancer. Since her last visit here he had a pacemaker placed. 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 He will be 78 years old in 2 days  REVIEW OF SYSTEMS: He wakes up late, she would some of his guidance, and takes it easy the rest of the day. He and his wife [name] [name] haven't made who does most of the housework. He thinks he can walk 50 yards without stopping. He has the poor urine flow and some dribbling but that is not new and has not gotten any worse. He has had no fevers, drenching sweats, bleeding, adenopathy, or any change in stool habits. He does have chronic back pain. A detailed review of systems was otherwise noncontributory  PAST MEDICAL HISTORY: Past Medical History  Diagnosis Date  . Depression   . Aneurysm of abdominal aorta     [facility], Dr. Royann Shivers  . History of colon cancer     hospitalization 1999  . Degenerative disc disease, lumbar   . History of non-Hodgkin's lymphoma     hospitalization 2002  . Cataract   . Hard of hearing     wears hearing aids  . Hypertension   . Hyperlipidemia   . Thrombocytopenia     Dr. Darnelle Catalan  . Chronic renal disease   . Memory loss   . Hyperglycemia   . Edema   . Heart murmur     Echocardiogram 7/10, stress test 2008, [facility]  . Overactive bladder   . Anemia     Dr. Darnelle Catalan  . AV block, 2nd degree 02/06/2012  . Lymphoma, history of 02/06/2012  . H/O colostomy 02/06/2012  . History of AAA (abdominal aortic aneurysm) repair 02/06/2012  . CKD (chronic kidney disease) stage 3, GFR 30-59 ml/min 02/06/2012  . Atrial fibrillation 01/2012    cardioversion to NSR  . Pacemaker 02/08/2012    St Jude Medical Accent DR RF dual-chamber pacemaker. Dr. Hillis Range    PAST SURGICAL HISTORY: Past Surgical History    Procedure Date  . Colostomy   . Cardioversion 02/09/2012    Procedure: CARDIOVERSION;  Surgeon: Chrystie Nose, MD;  Location: [facility] OR;  Service: Cardiovascular;  Laterality: N/A;  . Pacemaker placement 02/08/2012    Dr. Hillis Range ([facility])    ADVANCED DIRECTIVES: Not in place  HEALTH MAINTENANCE: History  Substance Use Topics  . Smoking status: Former Smoker    Quit date: 08/17/1967  . Smokeless tobacco: Never Used  . Alcohol Use: 0.6 oz/week    1 Cans of beer per week     Colonoscopy:  Bone density:  Lipid panel:  No Known Allergies  Current Outpatient Prescriptions  Medication Sig Dispense Refill  . amLODipine-valsartan (EXFORGE) 5-160 MG per tablet Take 1 tablet by mouth every evening.      Marland Kitchen apixaban (ELIQUIS) 5 MG TABS tablet Take 0.5 tablets (2.5 mg total) by mouth 2 (two) times daily.  60 tablet  2  . aspirin 325 MG tablet Take 325 mg by mouth every morning.       Marland Kitchen atenolol (TENORMIN) 25 MG tablet Take 25 mg by mouth every evening.       . escitalopram (LEXAPRO) 20 MG tablet Take 1 tablet (20 mg total) by mouth daily.  30 tablet  5  .  HYDROcodone-acetaminophen (LORCET) 10-650 MG per tablet Take 1 tablet by mouth every 4 (four) hours as needed.       . meclizine (ANTIVERT) 25 MG tablet Take 25 mg by mouth daily as needed.       . niacin (NIASPAN) 500 MG CR tablet Take 1 tablet (500 mg total) by mouth at bedtime.  30 tablet  11  . silodosin (RAPAFLO) 8 MG CAPS capsule Take 1 capsule (8 mg total) by mouth every morning.  90 capsule  2  . Testosterone 20.25 MG/ACT (1.62%) GEL Place 2 application onto the skin 1 day or 1 dose.  75 g  5  . traMADol (ULTRAM) 50 MG tablet Take 1 tablet (50 mg total) by mouth every 6 (six) hours as needed for pain.  100 tablet  1    OBJECTIVE: Elderly white male who appears frail Filed Vitals:   06/07/12 1428  BP: 91/54  Pulse: 85  Temp: 97 F (36.1 C)  Resp: 20     Body mass index is 30.84 kg/(m^2).    ECOG FS: 2  Sclerae  unicteric Oropharynx clear No cervical or supraclavicular adenopathy Lungs no rales or rhonchi Heart regular rate and rhythm, murmur not appreciated Abd soft, nontender, no palpable splenomegaly, no inguinal adenopathy MSK no peripheral edema Neuro: nonfocal   LAB RESULTS: Lab Results  Component Value Date   WBC 9.1 03/14/2012   NEUTROABS 7.2 03/14/2012   HGB 10.5* 03/14/2012   HCT 31.0* 03/14/2012   MCV 94.5 03/14/2012   PLT 147* 03/14/2012      Chemistry      Component Value Date/Time   NA 139 03/14/2012 1441   K 4.0 03/14/2012 1441   CL 106 03/14/2012 1441   CO2 25 03/14/2012 1441   BUN 21 03/14/2012 1441   CREATININE 1.48* 03/14/2012 1441   CREATININE 1.30 12/27/2011 1547      Component Value Date/Time   CALCIUM 9.3 03/14/2012 1441   ALKPHOS 75 03/14/2012 1441   AST 14 03/14/2012 1441   ALT 9 03/14/2012 1441   BILITOT 0.4 03/14/2012 1441       No results found for this basename: LABCA2    No components found with this basename: LABCA125    No results found for this basename: INR:1;PROTIME:1 in the last 168 hours  Urinalysis    Component Value Date/Time   COLORURINE YELLOW 02/06/2012 1450   APPEARANCEUR CLEAR 02/06/2012 1450   LABSPEC 1.020 02/06/2012 1450   PHURINE 5.5 02/06/2012 1450   GLUCOSEU NEGATIVE 02/06/2012 1450   HGBUR NEGATIVE 02/06/2012 1450   BILIRUBINUR NEGATIVE 02/06/2012 1450   BILIRUBINUR neg 08/27/2011 1525   KETONESUR NEGATIVE 02/06/2012 1450   PROTEINUR NEGATIVE 02/06/2012 1450   UROBILINOGEN 0.2 02/06/2012 1450   UROBILINOGEN negative 08/27/2011 1525   NITRITE NEGATIVE 02/06/2012 1450   NITRITE neg 08/27/2011 1525   LEUKOCYTESUR NEGATIVE 02/06/2012 1450    STUDIES: No results found.  ASSESSMENT: 78 y.o. [place] man with a history of:  (1) Rectal carcinoma, T2 N0, status post APR December of 2000, and likely cured.  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 He knows we will always be glad to see him on an as-needed basis, or if he just wants to call with some questions. However we are not making any further  appointments for him here.   Krystle Oberman C    06/07/2012

## 2012-06-09 ENCOUNTER — Telehealth: Payer: Self-pay | Admitting: Internal Medicine

## 2012-06-09 NOTE — Telephone Encounter (Signed)
Opal called stating that she saw on a piece of paper that [name] [name] had stage 3 kidney disease and wanted to know what was going on. Pt nor opal had heard about it. I told the patient that when we print off office summary  about what you and the doctor dicussed does not have all the problems going on with the patient on our paper and i was not sure where she was getting that from. Pt asked was it in his chart and I said I have no idea and she stated that the next appt [name] has she is coming in to discuss this because she was not happy with this.

## 2012-06-12 ENCOUNTER — Other Ambulatory Visit: Payer: Self-pay | Admitting: Medical

## 2012-06-16 ENCOUNTER — Telehealth: Payer: Self-pay | Admitting: Internal Medicine

## 2012-06-16 MED ORDER — ESCITALOPRAM OXALATE 20 MG PO TABS: 20.0000 mg | ORAL_TABLET | Freq: Every day | ORAL | Status: AC

## 2012-06-16 NOTE — Telephone Encounter (Signed)
lexapro 20mg  was filled on 03/27/12 #30 with 5 refills and pharmacy requesting #90 so i resent it with #90 no refills

## 2012-07-03 ENCOUNTER — Ambulatory Visit: Payer: Medicare Other | Admitting: Family Medicine

## 2012-07-04 ENCOUNTER — Encounter: Payer: Self-pay | Admitting: Family Medicine

## 2012-07-04 ENCOUNTER — Ambulatory Visit (INDEPENDENT_AMBULATORY_CARE_PROVIDER_SITE_OTHER): Payer: Medicare Other | Admitting: Medical

## 2012-07-04 ENCOUNTER — Ambulatory Visit: Payer: Medicare Other | Admitting: Family Medicine

## 2012-07-04 VITALS — BP 110/70 | HR 78 | Wt 184.0 lb

## 2012-07-04 DIAGNOSIS — G8929 Other chronic pain: Secondary | ICD-10-CM

## 2012-07-04 DIAGNOSIS — R42 Dizziness and giddiness: Secondary | ICD-10-CM

## 2012-07-04 DIAGNOSIS — E291 Testicular hypofunction: Secondary | ICD-10-CM

## 2012-07-04 DIAGNOSIS — M549 Dorsalgia, unspecified: Secondary | ICD-10-CM

## 2012-07-04 DIAGNOSIS — G4700 Insomnia, unspecified: Secondary | ICD-10-CM

## 2012-07-04 DIAGNOSIS — Z9181 History of falling: Secondary | ICD-10-CM

## 2012-07-04 NOTE — Progress Notes (Signed)
Subjective: Here for recheck on a few issues.  I normally see him, but he has seen Dr. Susann Givens here last few times.  He was seen here about a month ago for back pain and low energy, hx/o chronic low back pain and hypogonadism.  Dr. Susann Givens started him back on Androgel 2 pumps daily last visit.  He sees no improvement in energy after 45mo.  He was found to be quite low with TST level months ago, started on therapy but started having urinary issues, retention.    He has stopped both ultram and hydrocodone.   His dizziness has improved.   He still has low back pain daily, but is just going to deal with it for now.  He feels overall better off pain medication.    Still has problems getting and staying asleep.    Saw hematology recently and has finally been released from their care regarding hx/o rectal carcinoma and non -hodgkin lymphoma.    Past Medical History  Diagnosis Date  . Depression   . Aneurysm of abdominal aorta     [facility], Dr. Royann Shivers  . History of colon cancer     hospitalization 1999  . Degenerative disc disease, lumbar   . History of non-Hodgkin's lymphoma     hospitalization 2002  . Cataract   . Hard of hearing     wears hearing aids  . Hypertension   . Hyperlipidemia   . Thrombocytopenia     Dr. Darnelle Catalan  . Chronic renal disease   . Memory loss   . Hyperglycemia   . Edema   . Heart murmur     Echocardiogram 7/10, stress test 2008, [facility]  . Overactive bladder   . Anemia     Dr. Darnelle Catalan  . AV block, 2nd degree 02/06/2012  . Lymphoma, history of 02/06/2012  . H/O colostomy 02/06/2012  . History of AAA (abdominal aortic aneurysm) repair 02/06/2012  . CKD (chronic kidney disease) stage 3, GFR 30-59 ml/min 02/06/2012  . Atrial fibrillation 01/2012    cardioversion to NSR  . Pacemaker 02/08/2012    [employer] DR RF dual-chamber pacemaker. Dr. Hillis Range   ROS as in HPI  Objective: Gen: wd, wn, nad Otherwise not examined today  Assessment: Encounter  Diagnoses  Name Primary?  . Chronic back pain Yes  . Hypogonadism male   . Insomnia   . Risk for falls   . Dizziness    Plan: Chronic back pain - off pain medications for now.  Given potential risks vs benefits of other medications for pain control he will just deal with pain for now.  I don't think there are any great options.  Risks of GI bleeds with NSAIDs, risks of CNS effects with narcotics, TCAs, anticonvulsants.  We discussed potential options.  We discussed PT.     Hypogonadism - increase to 3 pumps of Androgel daily.   Recheck TST lab in 45mo.  Insomnia - he is a fall risk, so benzos and other sedatives may not be the best options.  Can consider Ambien, but for now he will try OTC Benadryl QHS  Dizziness - resolved once he stopped pain medications.

## 2012-07-06 ENCOUNTER — Ambulatory Visit: Payer: Medicare Other | Admitting: Family Medicine

## 2012-07-09 ENCOUNTER — Emergency Department (HOSPITAL_COMMUNITY): Payer: Medicare Other

## 2012-07-09 ENCOUNTER — Observation Stay (HOSPITAL_COMMUNITY)
Admission: EM | Admit: 2012-07-09 | Discharge: 2012-07-10 | Disposition: A | Discharge status: Home / Self Care | Payer: Medicare Other | Attending: Internal Medicine | Admitting: Internal Medicine

## 2012-07-09 DIAGNOSIS — E785 Hyperlipidemia, unspecified: Secondary | ICD-10-CM | POA: Insufficient documentation

## 2012-07-09 DIAGNOSIS — I10 Essential (primary) hypertension: Secondary | ICD-10-CM

## 2012-07-09 DIAGNOSIS — I480 Paroxysmal atrial fibrillation: Secondary | ICD-10-CM | POA: Diagnosis present

## 2012-07-09 DIAGNOSIS — Z79899 Other long term (current) drug therapy: Secondary | ICD-10-CM | POA: Insufficient documentation

## 2012-07-09 DIAGNOSIS — I129 Hypertensive chronic kidney disease with stage 1 through stage 4 chronic kidney disease, or unspecified chronic kidney disease: Secondary | ICD-10-CM | POA: Insufficient documentation

## 2012-07-09 DIAGNOSIS — I441 Atrioventricular block, second degree: Secondary | ICD-10-CM | POA: Insufficient documentation

## 2012-07-09 DIAGNOSIS — I714 Abdominal aortic aneurysm, without rupture, unspecified: Secondary | ICD-10-CM | POA: Insufficient documentation

## 2012-07-09 DIAGNOSIS — Z950 Presence of cardiac pacemaker: Secondary | ICD-10-CM | POA: Insufficient documentation

## 2012-07-09 DIAGNOSIS — C8590 Non-Hodgkin lymphoma, unspecified, unspecified site: Secondary | ICD-10-CM | POA: Diagnosis present

## 2012-07-09 DIAGNOSIS — Z22322 Carrier or suspected carrier of Methicillin resistant Staphylococcus aureus: Secondary | ICD-10-CM

## 2012-07-09 DIAGNOSIS — C189 Malignant neoplasm of colon, unspecified: Secondary | ICD-10-CM | POA: Diagnosis present

## 2012-07-09 DIAGNOSIS — N189 Chronic kidney disease, unspecified: Secondary | ICD-10-CM | POA: Insufficient documentation

## 2012-07-09 DIAGNOSIS — Z85038 Personal history of other malignant neoplasm of large intestine: Secondary | ICD-10-CM | POA: Insufficient documentation

## 2012-07-09 DIAGNOSIS — R7989 Other specified abnormal findings of blood chemistry: Secondary | ICD-10-CM | POA: Diagnosis present

## 2012-07-09 DIAGNOSIS — I4891 Unspecified atrial fibrillation: Principal | ICD-10-CM | POA: Insufficient documentation

## 2012-07-09 DIAGNOSIS — N400 Enlarged prostate without lower urinary tract symptoms: Secondary | ICD-10-CM | POA: Insufficient documentation

## 2012-07-09 DIAGNOSIS — Z7901 Long term (current) use of anticoagulants: Secondary | ICD-10-CM | POA: Insufficient documentation

## 2012-07-09 DIAGNOSIS — Z87898 Personal history of other specified conditions: Secondary | ICD-10-CM | POA: Insufficient documentation

## 2012-07-09 LAB — PHOSPHORUS: Phosphorus: 2.7 mg/dL (ref 2.3–4.6)

## 2012-07-09 LAB — CBC
MCH: 31.3 pg (ref 26.0–34.0)
MCV: 91.1 fL (ref 78.0–100.0)
Platelets: 157 10*3/uL (ref 150–400)
RDW: 14.1 % (ref 11.5–15.5)
WBC: 5.7 10*3/uL (ref 4.0–10.5)

## 2012-07-09 LAB — BASIC METABOLIC PANEL
Calcium: 10 mg/dL (ref 8.4–10.5)
Chloride: 106 mEq/L (ref 96–112)
Creatinine, Ser: 1.09 mg/dL (ref 0.50–1.35)
GFR calc Af Amer: 68 mL/min — ABNORMAL LOW (ref >90)

## 2012-07-09 LAB — POCT I-STAT TROPONIN I: Troponin i, poc: 0.01 ng/mL (ref 0.00–0.08)

## 2012-07-09 MED ORDER — ATENOLOL 25 MG PO TABS
25.0000 mg | ORAL_TABLET | Freq: Once | ORAL | Status: AC
  Administered 2012-07-09: 25 mg via ORAL
  Filled 2012-07-09: qty 1

## 2012-07-09 NOTE — ED Notes (Signed)
Pt states that he felt his heart rate go "crazy" x 1 hour. States his HR goes anywhere from 70s to 110s. Pt denies pain or shob

## 2012-07-09 NOTE — ED Notes (Signed)
Pt states he can feel his heart fluttering.  States he had pacer/defib put in back in June of this year

## 2012-07-09 NOTE — ED Notes (Signed)
Pt wife to Bedside. Pt cont to deny any chest pain or shob

## 2012-07-09 NOTE — ED Provider Notes (Signed)
History     CSN: [id number]  Arrival date & time 07/09/12  2113   First MD Initiated Contact with Patient 07/09/12 2131      Chief Complaint  Patient presents with  . Irregular Heart Beat    (Consider location/radiation/quality/duration/timing/severity/associated sxs/prior treatment) HPI Comments: States that since 6 PM his heart rate has been  jumping around from one 30-90. He states he also had some chest tightness when it started. Denies any chest pain currently. He is taking all of his medications today  Patient is a 78 y.o. male presenting with palpitations. The history is provided by the patient.  Palpitations  This is a new problem. The current episode started 3 to 5 hours ago. The problem occurs constantly. The problem has not changed since onset.Associated with: started when walking back to the house. Associated symptoms include chest pressure and irregular heartbeat. Pertinent negatives include no diaphoresis, no near-syncope, no syncope, no leg pain, no dizziness and no cough. He has tried nothing for the symptoms. The treatment provided no relief.    Past Medical History  Diagnosis Date  . Depression   . Aneurysm of abdominal aorta     [facility], Dr. Royann Shivers  . History of colon cancer     hospitalization 1999  . Degenerative disc disease, lumbar   . History of non-Hodgkin's lymphoma     hospitalization 2002  . Cataract   . Hard of hearing     wears hearing aids  . Hypertension   . Hyperlipidemia   . Thrombocytopenia     Dr. Darnelle Catalan  . Chronic renal disease   . Memory loss   . Hyperglycemia   . Edema   . Heart murmur     Echocardiogram 7/10, stress test 2008, [facility]  . Overactive bladder   . Anemia     Dr. Darnelle Catalan  . AV block, 2nd degree 02/06/2012  . Lymphoma, history of 02/06/2012  . H/O colostomy 02/06/2012  . History of AAA (abdominal aortic aneurysm) repair 02/06/2012  . CKD (chronic kidney disease) stage 3, GFR 30-59 ml/min 02/06/2012  . Atrial  fibrillation 01/2012    cardioversion to NSR  . Pacemaker 02/08/2012    [employer] DR RF dual-chamber pacemaker. Dr. Hillis Range    Past Surgical History  Procedure Date  . Colostomy   . Cardioversion 02/09/2012    Procedure: CARDIOVERSION;  Surgeon: Chrystie Nose, MD;  Location: [facility] OR;  Service: Cardiovascular;  Laterality: N/A;  . Pacemaker placement 02/08/2012    Dr. Hillis Range ([facility])    No family history on file.  History  Substance Use Topics  . Smoking status: Former Smoker    Quit date: 08/17/1967  . Smokeless tobacco: Never Used  . Alcohol Use: 0.6 oz/week    1 Cans of beer per week      Review of Systems  Constitutional: Negative for diaphoresis.  Respiratory: Negative for cough.   Cardiovascular: Positive for palpitations. Negative for syncope and near-syncope.  Neurological: Negative for dizziness.  All other systems reviewed and are negative.    Allergies  Review of patient's allergies indicates no known allergies.  Home Medications   Current Outpatient Rx  Name  Route  Sig  Dispense  Refill  . AMLODIPINE BESYLATE-VALSARTAN 5-160 MG PO TABS   Oral   Take 1 tablet by mouth every evening.         . APIXABAN 5 MG PO TABS   Oral   Take 0.5 tablets (2.5  mg total) by mouth 2 (two) times daily.   60 tablet   2   . ASPIRIN 325 MG PO TABS   Oral   Take 325 mg by mouth every morning.          . ATENOLOL 25 MG PO TABS   Oral   Take 25 mg by mouth every evening.          Marland Kitchen ESCITALOPRAM OXALATE 20 MG PO TABS   Oral   Take 1 tablet (20 mg total) by mouth daily.   90 tablet   0   . NIACIN ER (ANTIHYPERLIPIDEMIC) 500 MG PO TBCR   Oral   Take 1 tablet (500 mg total) by mouth at bedtime.   30 tablet   11   . SILODOSIN 8 MG PO CAPS   Oral   Take 1 capsule (8 mg total) by mouth every morning.   90 capsule   2     BP 113/80  Pulse 69  Temp 97.6 F (36.4 C) (Oral)  Resp 18  SpO2 100%  Physical Exam  Nursing note and  vitals reviewed. Constitutional: He is oriented to person, place, and time. He appears well-developed and well-nourished. No distress.  HENT:  Head: Normocephalic and atraumatic.  Mouth/Throat: Oropharynx is clear and moist.  Eyes: Conjunctivae normal and EOM are normal. Pupils are equal, round, and reactive to light.  Neck: Normal range of motion. Neck supple.  Cardiovascular: Intact distal pulses.  An irregularly irregular rhythm present. Tachycardia present.   No murmur heard. Pulmonary/Chest: Effort normal and breath sounds normal. No respiratory distress. He has no wheezes. He has no rales.  Abdominal: Soft. He exhibits no distension. There is no tenderness. There is no rebound and no guarding.  Musculoskeletal: Normal range of motion. He exhibits no edema and no tenderness.  Neurological: He is alert and oriented to person, place, and time.  Skin: Skin is warm and dry. No rash noted. No erythema.       Hypopigmented skin  Psychiatric: He has a normal mood and affect. His behavior is normal.    ED Course  Procedures (including critical care time)  Labs Reviewed  CBC - Abnormal; Notable for the following:    RBC 3.93 (*)     Hemoglobin 12.3 (*)     HCT 35.8 (*)     All other components within normal limits  BASIC METABOLIC PANEL - Abnormal; Notable for the following:    Glucose, Bld 148 (*)     BUN 27 (*)     GFR calc non Af Amer 59 (*)     GFR calc Af Amer 68 (*)     All other components within normal limits  POCT I-STAT TROPONIN I   Dg Chest Port 1 View  07/09/2012  *RADIOLOGY REPORT*  Clinical Data: Irregular heart rate.  PORTABLE CHEST - 1 VIEW  Comparison: Chest x-ray 03/14/2012.  Findings: A pacemaker is in position with lead tips projecting over the expected location of the right atrium and right ventricular apex.  The leads appear unchanged compared to the prior study. Lung volumes are normal.  No consolidative airspace disease.  No pleural effusions.  No  pneumothorax.  No pulmonary nodule or mass noted. Pulmonary vasculature is normal limits. Mild cardiomegaly (unchanged).  Atherosclerosis of the thoracic aorta.  IMPRESSION: 1. No radiographic evidence of acute cardiopulmonary disease. Mild cardiomegaly is unchanged. 2.  Atherosclerosis. 3.  Pacemaker remains in position with no significant change in position  of the lead tips, as above.   Original Report Authenticated By: Trudie Reed, M.D.     Date: 07/09/2012  Rate: 108  Rhythm: atrial fibrillation  QRS Axis: normal  Intervals: normal  ST/T Wave abnormalities: nonspecific ST/T changes  Conduction Disutrbances:none  Narrative Interpretation:   Old EKG Reviewed: changes noted.  No notable pacer spikes.  Non-specific t-wave changes    1. Atrial fibrillation with RVR       MDM   Patient history of tachybradycardia syndrome and pacemaker placed in June who comes in due to palpitations. His heart rate is going from 90 to 130 rapidly with intermittent pacer spikes. However the heart rate is never going below 80. He states it initially started with some mild chest pain but denies shortness of breath or any pain currently. Patient is on rate control and does take Tenormin.  States he has not missed any doses. Spoke with Dr. Gery Pray with cardiology and he recommended observation overnight. Patient was given a second dose of his Tenormin to see if that helped stabilize heart rate. Will interrogate pacemaker. Initial troponin was negative chest x-ray shows normal placement of pacemaker. Labs including mag and phosphate pending. Patient is well-appearing and in no acute distress       Gwyneth Sprout, MD 07/09/12 2320

## 2012-07-09 NOTE — ED Notes (Signed)
EKG completed and shown to MD

## 2012-07-10 ENCOUNTER — Telehealth: Payer: Self-pay | Admitting: Family Medicine

## 2012-07-10 ENCOUNTER — Encounter (HOSPITAL_COMMUNITY): Payer: Self-pay | Admitting: *Deleted

## 2012-07-10 DIAGNOSIS — Z7901 Long term (current) use of anticoagulants: Secondary | ICD-10-CM

## 2012-07-10 DIAGNOSIS — Z22322 Carrier or suspected carrier of Methicillin resistant Staphylococcus aureus: Secondary | ICD-10-CM

## 2012-07-10 DIAGNOSIS — I4891 Unspecified atrial fibrillation: Secondary | ICD-10-CM

## 2012-07-10 DIAGNOSIS — Z950 Presence of cardiac pacemaker: Secondary | ICD-10-CM

## 2012-07-10 DIAGNOSIS — N400 Enlarged prostate without lower urinary tract symptoms: Secondary | ICD-10-CM

## 2012-07-10 DIAGNOSIS — I10 Essential (primary) hypertension: Secondary | ICD-10-CM

## 2012-07-10 DIAGNOSIS — R7989 Other specified abnormal findings of blood chemistry: Secondary | ICD-10-CM | POA: Diagnosis present

## 2012-07-10 LAB — TROPONIN I
Troponin I: <0.3 ng/mL (ref <0.30)
Troponin I: <0.3 ng/mL (ref <0.30)
Troponin I: <0.3 ng/mL (ref <0.30)

## 2012-07-10 LAB — CBC WITH DIFFERENTIAL/PLATELET
Basophils Absolute: 0 10*3/uL (ref 0.0–0.1)
Eosinophils Relative: 5 % (ref 0–5)
HCT: 36 % — ABNORMAL LOW (ref 39.0–52.0)
Lymphocytes Relative: 28 % (ref 12–46)
Lymphs Abs: 1.6 10*3/uL (ref 0.7–4.0)
MCV: 90.9 fL (ref 78.0–100.0)
Monocytes Absolute: 0.5 10*3/uL (ref 0.1–1.0)
Neutro Abs: 3.5 10*3/uL (ref 1.7–7.7)
RBC: 3.96 MIL/uL — ABNORMAL LOW (ref 4.22–5.81)
RDW: 14 % (ref 11.5–15.5)
WBC: 5.9 10*3/uL (ref 4.0–10.5)

## 2012-07-10 LAB — COMPREHENSIVE METABOLIC PANEL
ALT: 12 U/L (ref 0–53)
AST: 18 U/L (ref 0–37)
Albumin: 3.5 g/dL (ref 3.5–5.2)
Alkaline Phosphatase: 85 U/L (ref 39–117)
Chloride: 105 mEq/L (ref 96–112)
Potassium: 3.6 mEq/L (ref 3.5–5.1)
Sodium: 140 mEq/L (ref 135–145)
Total Bilirubin: 0.3 mg/dL (ref 0.3–1.2)
Total Protein: 6.9 g/dL (ref 6.0–8.3)

## 2012-07-10 LAB — MRSA PCR SCREENING: MRSA by PCR: POSITIVE — AB

## 2012-07-10 LAB — T3, FREE: T3, Free: 2.7 pg/mL (ref 2.3–4.2)

## 2012-07-10 MED ORDER — ASPIRIN 325 MG PO TABS
325.0000 mg | ORAL_TABLET | Freq: Every day | ORAL | Status: DC
  Filled 2012-07-10: qty 1

## 2012-07-10 MED ORDER — CHLORHEXIDINE GLUCONATE CLOTH 2 % EX PADS
6.0000 | MEDICATED_PAD | Freq: Every day | CUTANEOUS | Status: DC
  Administered 2012-07-10: 6 via TOPICAL

## 2012-07-10 MED ORDER — ONDANSETRON HCL 4 MG/2ML IJ SOLN: 4.0000 mg | Freq: Four times a day (QID) | INTRAMUSCULAR | Status: DC | PRN

## 2012-07-10 MED ORDER — ESCITALOPRAM OXALATE 20 MG PO TABS
20.0000 mg | ORAL_TABLET | Freq: Every day | ORAL | Status: DC
  Administered 2012-07-10: 20 mg via ORAL
  Filled 2012-07-10: qty 1

## 2012-07-10 MED ORDER — SODIUM CHLORIDE 0.9 % IJ SOLN
3.0000 mL | Freq: Two times a day (BID) | INTRAMUSCULAR | Status: DC
  Administered 2012-07-10: 3 mL via INTRAVENOUS

## 2012-07-10 MED ORDER — METOPROLOL TARTRATE 25 MG PO TABS
25.0000 mg | ORAL_TABLET | Freq: Two times a day (BID) | ORAL | Status: DC
  Administered 2012-07-10: 25 mg via ORAL
  Filled 2012-07-10: qty 1

## 2012-07-10 MED ORDER — SODIUM CHLORIDE 0.9 % IV SOLN
INTRAVENOUS | Status: DC
  Administered 2012-07-10: 10:00:00 via INTRAVENOUS

## 2012-07-10 MED ORDER — IRBESARTAN 150 MG PO TABS
150.0000 mg | ORAL_TABLET | Freq: Every evening | ORAL | Status: DC
  Filled 2012-07-10: qty 1

## 2012-07-10 MED ORDER — PANTOPRAZOLE SODIUM 40 MG PO TBEC
40.0000 mg | DELAYED_RELEASE_TABLET | Freq: Every day | ORAL | Status: DC
  Administered 2012-07-10: 40 mg via ORAL
  Filled 2012-07-10: qty 1

## 2012-07-10 MED ORDER — TAMSULOSIN HCL 0.4 MG PO CAPS
0.4000 mg | ORAL_CAPSULE | Freq: Every day | ORAL | Status: DC
  Administered 2012-07-10: 0.4 mg via ORAL
  Filled 2012-07-10 (×2): qty 1

## 2012-07-10 MED ORDER — ACETAMINOPHEN 650 MG RE SUPP: 650.0000 mg | Freq: Four times a day (QID) | RECTAL | Status: DC | PRN

## 2012-07-10 MED ORDER — ASPIRIN 81 MG PO CHEW: 81.0000 mg | CHEWABLE_TABLET | Freq: Every day | ORAL | Status: DC

## 2012-07-10 MED ORDER — SODIUM CHLORIDE 0.9 % IV BOLUS (SEPSIS): 250.0000 mL | Freq: Once | INTRAVENOUS | Status: DC

## 2012-07-10 MED ORDER — METOPROLOL TARTRATE 25 MG PO TABS: 25.0000 mg | ORAL_TABLET | Freq: Two times a day (BID) | ORAL | Status: DC

## 2012-07-10 MED ORDER — AMLODIPINE BESYLATE 5 MG PO TABS
5.0000 mg | ORAL_TABLET | Freq: Every evening | ORAL | Status: DC
  Filled 2012-07-10: qty 1

## 2012-07-10 MED ORDER — NIACIN ER 500 MG PO CPCR
500.0000 mg | ORAL_CAPSULE | Freq: Every day | ORAL | Status: DC
  Filled 2012-07-10 (×2): qty 1

## 2012-07-10 MED ORDER — PANTOPRAZOLE SODIUM 40 MG PO TBEC: 40.0000 mg | DELAYED_RELEASE_TABLET | Freq: Every day | ORAL | Status: AC

## 2012-07-10 MED ORDER — ATENOLOL 25 MG PO TABS
25.0000 mg | ORAL_TABLET | Freq: Every evening | ORAL | Status: DC
  Filled 2012-07-10: qty 1

## 2012-07-10 MED ORDER — ONDANSETRON HCL 4 MG PO TABS: 4.0000 mg | ORAL_TABLET | Freq: Four times a day (QID) | ORAL | Status: DC | PRN

## 2012-07-10 MED ORDER — APIXABAN 2.5 MG PO TABS
2.5000 mg | ORAL_TABLET | Freq: Two times a day (BID) | ORAL | Status: DC
Start: 2012-07-10 — End: 2012-07-10
  Administered 2012-07-10: 2.5 mg via ORAL
  Filled 2012-07-10 (×3): qty 1

## 2012-07-10 MED ORDER — ASPIRIN 81 MG PO CHEW
81.0000 mg | CHEWABLE_TABLET | Freq: Every day | ORAL | Status: DC
  Administered 2012-07-10: 81 mg via ORAL
  Filled 2012-07-10: qty 1

## 2012-07-10 MED ORDER — MUPIROCIN 2 % EX OINT
1.0000 "application " | TOPICAL_OINTMENT | Freq: Two times a day (BID) | CUTANEOUS | Status: DC
  Administered 2012-07-10 (×2): 1 via NASAL
  Filled 2012-07-10: qty 22

## 2012-07-10 MED ORDER — METOPROLOL TARTRATE 25 MG PO TABS: 25.0000 mg | ORAL_TABLET | Freq: Three times a day (TID) | ORAL | Status: DC

## 2012-07-10 MED ORDER — ACETAMINOPHEN 325 MG PO TABS: 650.0000 mg | ORAL_TABLET | Freq: Four times a day (QID) | ORAL | Status: DC | PRN

## 2012-07-10 MED ORDER — AMLODIPINE BESYLATE-VALSARTAN 5-160 MG PO TABS: 1.0000 | ORAL_TABLET | Freq: Every evening | ORAL | Status: DC

## 2012-07-10 NOTE — Consult Note (Signed)
Pt. Seen and examined. Agree with the NP/PA-C note as written. Pleasant 78 yo male with a history of dual-chamber PPM placed this Summer due to High-degree AVB. He also has a history of atrial tachycardia and paroxysmal atrial fibrillation. He presented with a-fib and RVR. There is question as to whether he is taking Eliquis, which he was prescribed this Summer. Device interrogation shows 7 mode switches with a total AF burden of 2.3%.  He has converted back to sinus rhythm this morning. He is asymptomatic. D-dimer is elevated, however, he is elderly and has a history of lymphoma, pre-test probability of PE is low, saturations are normal, chest pain has resolved.  Troponin is negative x 2.   Plan:  I recommend changing atenolol to metoprolol 25 mg BID. Will restart Eliquis 2.5 mg po BID and decrease ASA to 81 mg daily.  Schedule outpatient echocardiogram in our office (last was 2010).  May be a candidate for anti-arrythmic therapy in the future if a-fib burden is higher. Possibly okay for discharge later today.  Will assume care of Mr. Blandon on our service. Thanks to the [facility] for their care.  Chrystie Nose, MD, [facility] Attending Cardiologist [facility]

## 2012-07-10 NOTE — Discharge Summary (Signed)
Patient ID: [name] [name],  MRN: [id number], DOB/AGE: Feb 02, 1922 78 y.o.  Admit date: 07/09/2012 Discharge date: 07/10/2012  Primary Care Provider: S. Tysinger PA Primary Cardiologist: Dr Royann Shivers  Discharge Diagnoses Principal Problem:  *Atrial fibrillation with RVR Active Problems:  PAF, documented June 2013  Pacemaker-St.Jude, implanted June 2013  Chronic anticoagulation, [name]  Positive D dimer, 1.1  Colon cancer, remote colostomy  HYPERLIPIDEMIA  HTN (hypertension)  BPH (benign prostatic hyperplasia)  AV block, 2nd degree  Non Hodgkin's lymphoma, Rx'd with chemotherapy '03  AAA recent Abd [place] "OK"  MRSA (methicillin resistant Staphylococcus aureus) carrier      Hospital Course:  This is a 78 y.o. male followed by Dr Royann Shivers and Kristian Covey PA. He lives in [place] with his girlfriend on [address]. He has had AVB in the past and had a St Jude pacemaker implanted by Dr Johney Frame in June 2013. Records indicate he had some PAF then and was discharged on [name]. 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 When he came back in he noted rapid HR. The pt had some associated chest tightness. In the ER he was in AF with rapid VR. He was admitted through the ER and Rx'd for rate control. He ruled out for an MI. His chest pain resolved with rate control. We adjusted his beta blocker and at discharge he is paced. Dr Rennis Golden feels he has PAF and pacer interogation confirmed a less than 2% AF burden. Dr Rennis Golden feels he can go home the evening of the 25th. It was not clear wether or not he was taking [name] at home, but we resumed this at discharge. He will follow up in 2 weeks as an OP.   Discharge Vitals:  Blood pressure 129/63, pulse 70, temperature 98.4 F (36.9 C), temperature source Oral, resp. rate 18, height 5\' 4"  (1.626 m), weight 82.7 kg (182 lb 5.1 oz), SpO2 97.00%.    Labs: Results for orders placed during the hospital encounter of 07/09/12 (from the past 48  hour(s))  CBC     Status: Abnormal   Collection Time   07/09/12  9:31 PM      Component Value Range Comment   WBC 5.7  4.0 - 10.5 K/uL    RBC 3.93 (*) 4.22 - 5.81 MIL/uL    Hemoglobin 12.3 (*) 13.0 - 17.0 g/dL    HCT 82.9 (*) 56.2 - 52.0 %    MCV 91.1  78.0 - 100.0 fL    MCH 31.3  26.0 - 34.0 pg    MCHC 34.4  30.0 - 36.0 g/dL    RDW 13.0  86.5 - 78.4 %    Platelets 157  150 - 400 K/uL   BASIC METABOLIC PANEL     Status: Abnormal   Collection Time   07/09/12  9:31 PM      Component Value Range Comment   Sodium 143  135 - 145 mEq/L    Potassium 3.9  3.5 - 5.1 mEq/L    Chloride 106  96 - 112 mEq/L    CO2 26  19 - 32 mEq/L    Glucose, Bld 148 (*) 70 - 99 mg/dL    BUN 27 (*) 6 - 23 mg/dL    Creatinine, Ser 6.96  0.50 - 1.35 mg/dL    Calcium 29.5  8.4 - 10.5 mg/dL    GFR calc non Af Amer 59 (*) >90 mL/min    GFR calc Af Amer 68 (*) >90 mL/min  POCT I-STAT TROPONIN I     Status: Normal   Collection Time   07/09/12  9:54 PM      Component Value Range Comment   Troponin i, poc 0.01  0.00 - 0.08 ng/mL    Comment 3            MAGNESIUM     Status: Normal   Collection Time   07/09/12 10:44 PM      Component Value Range Comment   Magnesium 2.0  1.5 - 2.5 mg/dL   PHOSPHORUS     Status: Normal   Collection Time   07/09/12 10:44 PM      Component Value Range Comment   Phosphorus 2.7  2.3 - 4.6 mg/dL   POCT I-STAT TROPONIN I     Status: Normal   Collection Time   07/09/12 11:00 PM      Component Value Range Comment   Troponin i, poc 0.01  0.00 - 0.08 ng/mL    Comment 3            MRSA PCR SCREENING     Status: Abnormal   Collection Time   07/10/12 12:09 AM      Component Value Range Comment   MRSA by PCR POSITIVE (*) NEGATIVE   TROPONIN I     Status: Normal   Collection Time   07/10/12  1:55 AM      Component Value Range Comment   Troponin I <0.30  <0.30 ng/mL   COMPREHENSIVE METABOLIC PANEL     Status: Abnormal   Collection Time   07/10/12  2:03 AM      Component Value  Range Comment   Sodium 140  135 - 145 mEq/L    Potassium 3.6  3.5 - 5.1 mEq/L    Chloride 105  96 - 112 mEq/L    CO2 26  19 - 32 mEq/L    Glucose, Bld 98  70 - 99 mg/dL    BUN 23  6 - 23 mg/dL    Creatinine, Ser 1.61  0.50 - 1.35 mg/dL    Calcium 9.8  8.4 - 09.6 mg/dL    Total Protein 6.9  6.0 - 8.3 g/dL    Albumin 3.5  3.5 - 5.2 g/dL    AST 18  0 - 37 U/L    ALT 12  0 - 53 U/L    Alkaline Phosphatase 85  39 - 117 U/L    Total Bilirubin 0.3  0.3 - 1.2 mg/dL    GFR calc non Af Amer 64 (*) >90 mL/min    GFR calc Af Amer 74 (*) >90 mL/min   TSH     Status: Normal   Collection Time   07/10/12  2:03 AM      Component Value Range Comment   TSH 4.059  0.350 - 4.500 uIU/mL   D-DIMER, QUANTITATIVE     Status: Abnormal   Collection Time   07/10/12  2:03 AM      Component Value Range Comment   D-Dimer, Quant 1.11 (*) 0.00 - 0.48 ug/mL-FEU   T4, FREE     Status: Normal   Collection Time   07/10/12  2:03 AM      Component Value Range Comment   Free T4 1.03  0.80 - 1.80 ng/dL   T3, FREE     Status: Normal   Collection Time   07/10/12  2:03 AM      Component Value Range Comment   T3,  Free 2.7  2.3 - 4.2 pg/mL   CBC WITH DIFFERENTIAL     Status: Abnormal   Collection Time   07/10/12  2:03 AM      Component Value Range Comment   WBC 5.9  4.0 - 10.5 K/uL    RBC 3.96 (*) 4.22 - 5.81 MIL/uL    Hemoglobin 12.4 (*) 13.0 - 17.0 g/dL    HCT 40.9 (*) 81.1 - 52.0 %    MCV 90.9  78.0 - 100.0 fL    MCH 31.3  26.0 - 34.0 pg    MCHC 34.4  30.0 - 36.0 g/dL    RDW 91.4  78.2 - 95.6 %    Platelets 152  150 - 400 K/uL    Neutrophils Relative 59  43 - 77 %    Neutro Abs 3.5  1.7 - 7.7 K/uL    Lymphocytes Relative 28  12 - 46 %    Lymphs Abs 1.6  0.7 - 4.0 K/uL    Monocytes Relative 9  3 - 12 %    Monocytes Absolute 0.5  0.1 - 1.0 K/uL    Eosinophils Relative 5  0 - 5 %    Eosinophils Absolute 0.3  0.0 - 0.7 K/uL    Basophils Relative 0  0 - 1 %    Basophils Absolute 0.0  0.0 - 0.1 K/uL     TROPONIN I     Status: Normal   Collection Time   07/10/12  5:40 AM      Component Value Range Comment   Troponin I <0.30  <0.30 ng/mL   TROPONIN I     Status: Normal   Collection Time   07/10/12  1:22 PM      Component Value Range Comment   Troponin I <0.30  <0.30 ng/mL     Disposition:  Follow-up Information    Follow up with Abelino Derrick, PA. On 07/25/2012. (11am)    Contact information:   [address] [place] [address] [phone]          Discharge Medications:    Medication List     As of 07/10/2012  4:06 PM    STOP taking these medications         aspirin 325 MG tablet      atenolol 25 MG tablet   Commonly known as: TENORMIN      TAKE these medications         amLODipine-valsartan 5-160 MG per tablet   Commonly known as: EXFORGE   Take 1 tablet by mouth every evening.      apixaban 5 MG Tabs tablet   Commonly known as: ELIQUIS   Take 0.5 tablets (2.5 mg total) by mouth 2 (two) times daily.      aspirin 81 MG chewable tablet   Chew 1 tablet (81 mg total) by mouth daily.      escitalopram 20 MG tablet   Commonly known as: LEXAPRO   Take 1 tablet (20 mg total) by mouth daily.      metoprolol tartrate 25 MG tablet   Commonly known as: LOPRESSOR   Take 1 tablet (25 mg total) by mouth 2 (two) times daily.      niacin 500 MG CR tablet   Commonly known as: NIASPAN   Take 1 tablet (500 mg total) by mouth at bedtime.      pantoprazole 40 MG tablet   Commonly known as: PROTONIX   Take 1 tablet (40  mg total) by mouth daily at 12 noon.      silodosin 8 MG Caps capsule   Commonly known as: RAPAFLO   Take 1 capsule (8 mg total) by mouth every morning.        Duration of Discharge Encounter: Greater than 30 minutes including physician time.  Jolene Provost PA-C 07/10/2012 4:06 PM

## 2012-07-10 NOTE — Consult Note (Signed)
Reason for Consult: AF  Requesting Physician: [facility]  HPI: This is a 78 y.o. male followed by Dr Royann Shivers and Kristian Covey PA. He lives in [place] with his girlfriend on [address]. He has had AVB in the past and has a [employer] pacemaker implanted by Dr Johney Frame in June. Records indicate he had some AF then and was discharged on [name]. 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 When he came back in he noted rapid HR. The pt had some associated chest tightness. In the ER he was in AF with rapid VR. He was admitted through the ER and Rx'd for rate control. He is pain free now.  PMHx:  Past Medical History  Diagnosis Date  . Depression   . Aneurysm of abdominal aorta     [facility], Dr. Royann Shivers  . History of colon cancer     hospitalization 1999  . Degenerative disc disease, lumbar   . History of non-Hodgkin's lymphoma     hospitalization 2002  . Cataract   . Hard of hearing     wears hearing aids  . Hypertension   . Hyperlipidemia   . Thrombocytopenia     Dr. Darnelle Catalan  . Chronic renal disease   . Memory loss   . Hyperglycemia   . Edema   . Heart murmur     Echocardiogram 7/10, stress test 2008, [facility]  . Overactive bladder   . Anemia     Dr. Darnelle Catalan  . AV block, 2nd degree 02/06/2012  . Lymphoma, history of 02/06/2012  . H/O colostomy 02/06/2012  . History of AAA (abdominal aortic aneurysm) repair 02/06/2012  . CKD (chronic kidney disease) stage 3, GFR 30-59 ml/min 02/06/2012  . Atrial fibrillation 01/2012    cardioversion to NSR  . Pacemaker 02/08/2012    St Jude [employer] Accent DR RF dual-chamber pacemaker. Dr. Hillis Range   Past Surgical History  Procedure Date  . Colostomy   . Cardioversion 02/09/2012    Procedure: CARDIOVERSION;  Surgeon: Chrystie Nose, MD;  Location: [facility] OR;  Service: Cardiovascular;  Laterality: N/A;  . Pacemaker placement 02/08/2012    Dr. Hillis Range ([facility])    FAMHx: History reviewed. No pertinent family history.  SOCHx:  reports that he quit smoking about 44 years ago. He has never used smokeless tobacco. He reports that he drinks about .6 ounces of alcohol per week. He reports that he does not use illicit drugs.  ALLERGIES: No Known Allergies  ROS: Pertinent items are noted in HPI. He has not had prior chest pain. He has had previous Myoview in the office. He has an AAA. This was last checked in Oct.  HOME MEDICATIONS: Prescriptions prior to admission  Medication Sig Dispense Refill  . amLODipine-valsartan (EXFORGE) 5-160 MG per tablet Take 1 tablet by mouth every evening.      Marland Kitchen apixaban (ELIQUIS) 5 MG TABS tablet Take 0.5 tablets (2.5 mg total) by mouth 2 (two) times daily.  60 tablet  2  . aspirin 325 MG tablet Take 325 mg by mouth every morning.       Marland Kitchen atenolol (TENORMIN) 25 MG tablet Take 25 mg by mouth every evening.       . escitalopram (LEXAPRO) 20 MG tablet Take 1 tablet (20 mg total) by mouth daily.  90 tablet  0  . niacin (NIASPAN) 500 MG CR tablet Take 1 tablet (500 mg total) by mouth at bedtime.  30 tablet  11  . silodosin (RAPAFLO)  8 MG CAPS capsule Take 1 capsule (8 mg total) by mouth every morning.  90 capsule  2    HOSPITAL MEDICATIONS: I have reviewed the patient's current medications.  VITALS: Blood pressure 120/91, pulse 88, temperature 97.7 F (36.5 C), temperature source Oral, resp. rate 16, height 5\' 4"  (1.626 m), weight 82.7 kg (182 lb 5.1 oz), SpO2 99.00%.  PHYSICAL EXAM: General appearance: alert, cooperative and no distress Neck: no carotid bruit and no JVD Lungs: clear to auscultation bilaterally Heart: irregularly irregular rhythm Abdomen: soft non tender, colostomy noted Extremities: extremities normal, atraumatic, no cyanosis or edema Pulses: 2+ and symmetric Skin: pale, cool and dry Neurologic: Grossly normal  LABS: Results for orders placed during the hospital encounter of 07/09/12 (from the past 48 hour(s))  CBC     Status: Abnormal   Collection Time    07/09/12  9:31 PM      Component Value Range Comment   WBC 5.7  4.0 - 10.5 K/uL    RBC 3.93 (*) 4.22 - 5.81 MIL/uL    Hemoglobin 12.3 (*) 13.0 - 17.0 g/dL    HCT 11.9 (*) 14.7 - 52.0 %    MCV 91.1  78.0 - 100.0 fL    MCH 31.3  26.0 - 34.0 pg    MCHC 34.4  30.0 - 36.0 g/dL    RDW 82.9  56.2 - 13.0 %    Platelets 157  150 - 400 K/uL   BASIC METABOLIC PANEL     Status: Abnormal   Collection Time   07/09/12  9:31 PM      Component Value Range Comment   Sodium 143  135 - 145 mEq/L    Potassium 3.9  3.5 - 5.1 mEq/L    Chloride 106  96 - 112 mEq/L    CO2 26  19 - 32 mEq/L    Glucose, Bld 148 (*) 70 - 99 mg/dL    BUN 27 (*) 6 - 23 mg/dL    Creatinine, Ser 8.65  0.50 - 1.35 mg/dL    Calcium 78.4  8.4 - 10.5 mg/dL    GFR calc non Af Amer 59 (*) >90 mL/min    GFR calc Af Amer 68 (*) >90 mL/min   POCT I-STAT TROPONIN I     Status: Normal   Collection Time   07/09/12  9:54 PM      Component Value Range Comment   Troponin i, poc 0.01  0.00 - 0.08 ng/mL    Comment 3            MAGNESIUM     Status: Normal   Collection Time   07/09/12 10:44 PM      Component Value Range Comment   Magnesium 2.0  1.5 - 2.5 mg/dL   PHOSPHORUS     Status: Normal   Collection Time   07/09/12 10:44 PM      Component Value Range Comment   Phosphorus 2.7  2.3 - 4.6 mg/dL   POCT I-STAT TROPONIN I     Status: Normal   Collection Time   07/09/12 11:00 PM      Component Value Range Comment   Troponin i, poc 0.01  0.00 - 0.08 ng/mL    Comment 3            MRSA PCR SCREENING     Status: Abnormal   Collection Time   07/10/12 12:09 AM      Component Value Range Comment   MRSA by PCR  POSITIVE (*) NEGATIVE   TROPONIN I     Status: Normal   Collection Time   07/10/12  1:55 AM      Component Value Range Comment   Troponin I <0.30  <0.30 ng/mL   COMPREHENSIVE METABOLIC PANEL     Status: Abnormal   Collection Time   07/10/12  2:03 AM      Component Value Range Comment   Sodium 140  135 - 145 mEq/L     Potassium 3.6  3.5 - 5.1 mEq/L    Chloride 105  96 - 112 mEq/L    CO2 26  19 - 32 mEq/L    Glucose, Bld 98  70 - 99 mg/dL    BUN 23  6 - 23 mg/dL    Creatinine, Ser 7.82  0.50 - 1.35 mg/dL    Calcium 9.8  8.4 - 95.6 mg/dL    Total Protein 6.9  6.0 - 8.3 g/dL    Albumin 3.5  3.5 - 5.2 g/dL    AST 18  0 - 37 U/L    ALT 12  0 - 53 U/L    Alkaline Phosphatase 85  39 - 117 U/L    Total Bilirubin 0.3  0.3 - 1.2 mg/dL    GFR calc non Af Amer 64 (*) >90 mL/min    GFR calc Af Amer 74 (*) >90 mL/min   D-DIMER, QUANTITATIVE     Status: Abnormal   Collection Time   07/10/12  2:03 AM      Component Value Range Comment   D-Dimer, Quant 1.11 (*) 0.00 - 0.48 ug/mL-FEU   CBC WITH DIFFERENTIAL     Status: Abnormal   Collection Time   07/10/12  2:03 AM      Component Value Range Comment   WBC 5.9  4.0 - 10.5 K/uL    RBC 3.96 (*) 4.22 - 5.81 MIL/uL    Hemoglobin 12.4 (*) 13.0 - 17.0 g/dL    HCT 21.3 (*) 08.6 - 52.0 %    MCV 90.9  78.0 - 100.0 fL    MCH 31.3  26.0 - 34.0 pg    MCHC 34.4  30.0 - 36.0 g/dL    RDW 57.8  46.9 - 62.9 %    Platelets 152  150 - 400 K/uL    Neutrophils Relative 59  43 - 77 %    Neutro Abs 3.5  1.7 - 7.7 K/uL    Lymphocytes Relative 28  12 - 46 %    Lymphs Abs 1.6  0.7 - 4.0 K/uL    Monocytes Relative 9  3 - 12 %    Monocytes Absolute 0.5  0.1 - 1.0 K/uL    Eosinophils Relative 5  0 - 5 %    Eosinophils Absolute 0.3  0.0 - 0.7 K/uL    Basophils Relative 0  0 - 1 %    Basophils Absolute 0.0  0.0 - 0.1 K/uL   TROPONIN I     Status: Normal   Collection Time   07/10/12  5:40 AM      Component Value Range Comment   Troponin I <0.30  <0.30 ng/mL     IMAGING: Dg Chest Port 1 View  07/09/2012  *RADIOLOGY REPORT*  Clinical Data: Irregular heart rate.  PORTABLE CHEST - 1 VIEW  Comparison: Chest x-ray 03/14/2012.  Findings: A pacemaker is in position with lead tips projecting over the expected location of the right atrium and right ventricular apex.  The  leads appear  unchanged compared to the prior study. Lung volumes are normal.  No consolidative airspace disease.  No pleural effusions.  No pneumothorax.  No pulmonary nodule or mass noted. Pulmonary vasculature is normal limits. Mild cardiomegaly (unchanged).  Atherosclerosis of the thoracic aorta.  IMPRESSION: 1. No radiographic evidence of acute cardiopulmonary disease. Mild cardiomegaly is unchanged. 2.  Atherosclerosis. 3.  Pacemaker remains in position with no significant change in position of the lead tips, as above.   Original Report Authenticated By: Trudie Reed, M.D.     IMPRESSION: Principal Problem:  *Atrial fibrillation with RVR Active Problems:  PAF, documented June 2013  Pacemaker-St.Jude, implanted June 2013  Chronic anticoagulation, [name]  Positive D dimer, 1.1  Colon cancer, remote colostomy  HYPERLIPIDEMIA  HTN (hypertension)  BPH (benign prostatic hyperplasia)  AV block, 2nd degree  Non Hodgkin's lymphoma, Rx'd with chemotherapy '03  AAA recent Abd [place] "OK"  MRSA (methicillin resistant Staphylococcus aureus) carrier   RECOMMENDATION: Will review office records. His pacemaker has been interrogated. It is NOT clear if he takes [name] or not. He will need a VQ or CT if he has not been on [name]. He has had past renal insufficiency but his SCr is WNL now.  Time Spent Directly with Patient: 40 minutes  Summer Parthasarathy K 07/10/2012, 9:26 AM

## 2012-07-10 NOTE — H&P (Signed)
[name] [name] is an 78 y.o. male.  Patient was seen and examined on July 10, 2012. PCP - physician extender Tysinger who works with Dr. Susann Givens. Cardiologist - [facility]. Chief Complaint: Palpitations and chest pressure. HPI: 78 year-old male with history of tachybradycardia syndrome status post pacemaker placement in June of this year presents with complaints of palpitations which started happening last evening while patient was at home. Denies any associated shortness of breath dizziness loss of consciousness fever chills or nausea vomiting or diaphoresis. He had mild chest pressure along with. Patient came to the ER and was found to have heart rates varying from 80-120 and was in A. fib. At this time patient admitted for further observation. Presently patient denies any chest pain shortness of breath and not in distress. In the ER patient was given additional dose of atenolol. Patient states he has been taking his medications as advised.  Past Medical History  Diagnosis Date  . Depression   . Aneurysm of abdominal aorta     [facility], Dr. Royann Shivers  . History of colon cancer     hospitalization 1999  . Degenerative disc disease, lumbar   . History of non-Hodgkin's lymphoma     hospitalization 2002  . Cataract   . Hard of hearing     wears hearing aids  . Hypertension   . Hyperlipidemia   . Thrombocytopenia     Dr. Darnelle Catalan  . Chronic renal disease   . Memory loss   . Hyperglycemia   . Edema   . Heart murmur     Echocardiogram 7/10, stress test 2008, [facility]  . Overactive bladder   . Anemia     Dr. Darnelle Catalan  . AV block, 2nd degree 02/06/2012  . Lymphoma, history of 02/06/2012  . H/O colostomy 02/06/2012  . History of AAA (abdominal aortic aneurysm) repair 02/06/2012  . CKD (chronic kidney disease) stage 3, GFR 30-59 ml/min 02/06/2012  . Atrial fibrillation 01/2012    cardioversion to NSR  . Pacemaker 02/08/2012    St Jude Medical Accent DR RF dual-chamber  pacemaker. Dr. Hillis Range    Past Surgical History  Procedure Date  . Colostomy   . Cardioversion 02/09/2012    Procedure: CARDIOVERSION;  Surgeon: Chrystie Nose, MD;  Location: [facility] OR;  Service: Cardiovascular;  Laterality: N/A;  . Pacemaker placement 02/08/2012    Dr. Hillis Range ([facility])    History reviewed. No pertinent family history. Social History:  reports that he quit smoking about 44 years ago. He has never used smokeless tobacco. He reports that he drinks about .6 ounces of alcohol per week. He reports that he does not use illicit drugs.  Allergies: No Known Allergies  Medications Prior to Admission  Medication Sig Dispense Refill  . amLODipine-valsartan (EXFORGE) 5-160 MG per tablet Take 1 tablet by mouth every evening.      Marland Kitchen apixaban (ELIQUIS) 5 MG TABS tablet Take 0.5 tablets (2.5 mg total) by mouth 2 (two) times daily.  60 tablet  2  . aspirin 325 MG tablet Take 325 mg by mouth every morning.       Marland Kitchen atenolol (TENORMIN) 25 MG tablet Take 25 mg by mouth every evening.       . escitalopram (LEXAPRO) 20 MG tablet Take 1 tablet (20 mg total) by mouth daily.  90 tablet  0  . niacin (NIASPAN) 500 MG CR tablet Take 1 tablet (500 mg total) by mouth at bedtime.  30 tablet  11  . silodosin (RAPAFLO) 8 MG CAPS capsule Take 1 capsule (8 mg total) by mouth every morning.  90 capsule  2    Results for orders placed during the hospital encounter of 07/09/12 (from the past 48 hour(s))  CBC     Status: Abnormal   Collection Time   07/09/12  9:31 PM      Component Value Range Comment   WBC 5.7  4.0 - 10.5 K/uL    RBC 3.93 (*) 4.22 - 5.81 MIL/uL    Hemoglobin 12.3 (*) 13.0 - 17.0 g/dL    HCT 52.8 (*) 41.3 - 52.0 %    MCV 91.1  78.0 - 100.0 fL    MCH 31.3  26.0 - 34.0 pg    MCHC 34.4  30.0 - 36.0 g/dL    RDW 24.4  01.0 - 27.2 %    Platelets 157  150 - 400 K/uL   BASIC METABOLIC PANEL     Status: Abnormal   Collection Time   07/09/12  9:31 PM      Component Value Range Comment    Sodium 143  135 - 145 mEq/L    Potassium 3.9  3.5 - 5.1 mEq/L    Chloride 106  96 - 112 mEq/L    CO2 26  19 - 32 mEq/L    Glucose, Bld 148 (*) 70 - 99 mg/dL    BUN 27 (*) 6 - 23 mg/dL    Creatinine, Ser 5.36  0.50 - 1.35 mg/dL    Calcium 64.4  8.4 - 10.5 mg/dL    GFR calc non Af Amer 59 (*) >90 mL/min    GFR calc Af Amer 68 (*) >90 mL/min   POCT I-STAT TROPONIN I     Status: Normal   Collection Time   07/09/12  9:54 PM      Component Value Range Comment   Troponin i, poc 0.01  0.00 - 0.08 ng/mL    Comment 3            MAGNESIUM     Status: Normal   Collection Time   07/09/12 10:44 PM      Component Value Range Comment   Magnesium 2.0  1.5 - 2.5 mg/dL   PHOSPHORUS     Status: Normal   Collection Time   07/09/12 10:44 PM      Component Value Range Comment   Phosphorus 2.7  2.3 - 4.6 mg/dL   POCT I-STAT TROPONIN I     Status: Normal   Collection Time   07/09/12 11:00 PM      Component Value Range Comment   Troponin i, poc 0.01  0.00 - 0.08 ng/mL    Comment 3             Dg Chest Port 1 View  07/09/2012  *RADIOLOGY REPORT*  Clinical Data: Irregular heart rate.  PORTABLE CHEST - 1 VIEW  Comparison: Chest x-ray 03/14/2012.  Findings: A pacemaker is in position with lead tips projecting over the expected location of the right atrium and right ventricular apex.  The leads appear unchanged compared to the prior study. Lung volumes are normal.  No consolidative airspace disease.  No pleural effusions.  No pneumothorax.  No pulmonary nodule or mass noted. Pulmonary vasculature is normal limits. Mild cardiomegaly (unchanged).  Atherosclerosis of the thoracic aorta.  IMPRESSION: 1. No radiographic evidence of acute cardiopulmonary disease. Mild cardiomegaly is unchanged. 2.  Atherosclerosis. 3.  Pacemaker remains in position with  no significant change in position of the lead tips, as above.   Original Report Authenticated By: Trudie Reed, M.D.     Review of Systems  Constitutional:  Negative.   HENT: Negative.   Eyes: Negative.   Respiratory: Negative.   Cardiovascular: Positive for chest pain and palpitations.  Gastrointestinal: Negative.   Genitourinary: Negative.   Musculoskeletal: Negative.   Skin: Negative.   Neurological: Negative.   Endo/Heme/Allergies: Negative.   Psychiatric/Behavioral: Negative.     Blood pressure 136/90, pulse 87, temperature 97.9 F (36.6 C), temperature source Oral, resp. rate 17, height 5\' 4"  (1.626 m), weight 82.7 kg (182 lb 5.1 oz), SpO2 98.00%. Physical Exam  Constitutional: He is oriented to person, place, and time. He appears well-developed and well-nourished. No distress.  HENT:  Head: Normocephalic and atraumatic.  Right Ear: External ear normal.  Left Ear: External ear normal.  Nose: Nose normal.  Mouth/Throat: Oropharynx is clear and moist. No oropharyngeal exudate.  Eyes: Conjunctivae normal are normal. Pupils are equal, round, and reactive to light. Right eye exhibits no discharge. Left eye exhibits no discharge. No scleral icterus.  Neck: Normal range of motion. Neck supple.  Cardiovascular: Normal rate and regular rhythm.   Respiratory: Effort normal and breath sounds normal. No respiratory distress. He has no wheezes. He has no rales.  GI: Soft. Bowel sounds are normal. He exhibits no distension. There is no tenderness. There is no rebound.  Musculoskeletal: He exhibits no edema and no tenderness.  Neurological: He is alert and oriented to person, place, and time.       Moves all extremities.  Skin: Skin is warm and dry. He is not diaphoretic.     Assessment/Plan #1. Atrial fibrillation with RVR with history of tachybradycardia syndrome status post pacemaker placement - have discussed with on-call cardiologist Dr. Allyson Sabal. If patient's heart rate tends to remain high then we will consider starting Cardizem infusion. Presently observe. Patient is on Apixaban. Check thyroid function test and d-dimer. Cycle cardiac  markers as patient had initially complained of chest pressure. #2. Hypertension - continue present medications. #3. Hyperlipidemia - continue present medications. #4. History of abdominal aortic aneurysm - denies any abdominal pain. #5. History of chronic kidney disease - metabolic panel appears at baseline. #6. History of rectal cancer and lymphoma in remission.  CODE STATUS - full code.  Koden Hunzeker N. 07/10/2012, 1:08 AM

## 2012-07-10 NOTE — Telephone Encounter (Signed)
Patients wife was made aware for [name] [name] to stop taking the 325 mg Asa daily per cardiologists and Maura Crandall. CLS

## 2012-07-10 NOTE — Telephone Encounter (Signed)
Message copied by Janeice Robinson on Mon Jul 10, 2012  8:48 AM ------      Message from: [place], [place] S      Created: Tue Jul 04, 2012  8:30 PM       pls call Dr. Erin Hearing office and ask his nurse to ask Dr. Salena Saner if he recommends Blu stop OR c/t Aspirin 325mg  daily.  His hematologist raised concern about him taking both Aspirin plus his antiplatelet drug in regards to risks of GI bleed.  He suggested he stop the aspirin.               I wanted to get cardiology's opinion.  Thanks.

## 2012-07-18 ENCOUNTER — Telehealth: Payer: Self-pay | Admitting: Family Medicine

## 2012-07-18 MED ORDER — AMLODIPINE BESYLATE-VALSARTAN 5-160 MG PO TABS: 1.0000 | ORAL_TABLET | Freq: Every evening | ORAL | Status: DC

## 2012-07-18 NOTE — Telephone Encounter (Signed)
SENT EXFORGE IN FOR PT

## 2012-07-28 ENCOUNTER — Encounter (HOSPITAL_COMMUNITY): Payer: Self-pay | Admitting: *Deleted

## 2012-07-28 ENCOUNTER — Emergency Department (HOSPITAL_COMMUNITY)
Admission: EM | Admit: 2012-07-28 | Discharge: 2012-07-28 | Disposition: A | Discharge status: Home / Self Care | Payer: Medicare Other | Attending: Emergency Medicine | Admitting: Emergency Medicine

## 2012-07-28 DIAGNOSIS — I4891 Unspecified atrial fibrillation: Secondary | ICD-10-CM | POA: Insufficient documentation

## 2012-07-28 DIAGNOSIS — M5137 Other intervertebral disc degeneration, lumbosacral region: Secondary | ICD-10-CM | POA: Insufficient documentation

## 2012-07-28 DIAGNOSIS — I129 Hypertensive chronic kidney disease with stage 1 through stage 4 chronic kidney disease, or unspecified chronic kidney disease: Secondary | ICD-10-CM | POA: Insufficient documentation

## 2012-07-28 DIAGNOSIS — Z85038 Personal history of other malignant neoplasm of large intestine: Secondary | ICD-10-CM | POA: Insufficient documentation

## 2012-07-28 DIAGNOSIS — Z87891 Personal history of nicotine dependence: Secondary | ICD-10-CM | POA: Insufficient documentation

## 2012-07-28 DIAGNOSIS — N183 Chronic kidney disease, stage 3 unspecified: Secondary | ICD-10-CM | POA: Insufficient documentation

## 2012-07-28 DIAGNOSIS — D696 Thrombocytopenia, unspecified: Secondary | ICD-10-CM | POA: Insufficient documentation

## 2012-07-28 DIAGNOSIS — R413 Other amnesia: Secondary | ICD-10-CM | POA: Insufficient documentation

## 2012-07-28 DIAGNOSIS — M51379 Other intervertebral disc degeneration, lumbosacral region without mention of lumbar back pain or lower extremity pain: Secondary | ICD-10-CM | POA: Insufficient documentation

## 2012-07-28 DIAGNOSIS — Z862 Personal history of diseases of the blood and blood-forming organs and certain disorders involving the immune mechanism: Secondary | ICD-10-CM | POA: Insufficient documentation

## 2012-07-28 DIAGNOSIS — Z8571 Personal history of Hodgkin lymphoma: Secondary | ICD-10-CM | POA: Insufficient documentation

## 2012-07-28 DIAGNOSIS — Z87898 Personal history of other specified conditions: Secondary | ICD-10-CM | POA: Insufficient documentation

## 2012-07-28 DIAGNOSIS — Z7982 Long term (current) use of aspirin: Secondary | ICD-10-CM | POA: Insufficient documentation

## 2012-07-28 DIAGNOSIS — Z8659 Personal history of other mental and behavioral disorders: Secondary | ICD-10-CM | POA: Insufficient documentation

## 2012-07-28 DIAGNOSIS — R011 Cardiac murmur, unspecified: Secondary | ICD-10-CM | POA: Insufficient documentation

## 2012-07-28 DIAGNOSIS — E785 Hyperlipidemia, unspecified: Secondary | ICD-10-CM | POA: Insufficient documentation

## 2012-07-28 DIAGNOSIS — Z8679 Personal history of other diseases of the circulatory system: Secondary | ICD-10-CM | POA: Insufficient documentation

## 2012-07-28 LAB — COMPREHENSIVE METABOLIC PANEL
Albumin: 3.4 g/dL — ABNORMAL LOW (ref 3.5–5.2)
Alkaline Phosphatase: 86 U/L (ref 39–117)
BUN: 22 mg/dL (ref 6–23)
CO2: 27 mEq/L (ref 19–32)
Chloride: 104 mEq/L (ref 96–112)
Creatinine, Ser: 1.29 mg/dL (ref 0.50–1.35)
GFR calc Af Amer: 55 mL/min — ABNORMAL LOW (ref >90)
GFR calc non Af Amer: 48 mL/min — ABNORMAL LOW (ref >90)
Glucose, Bld: 123 mg/dL — ABNORMAL HIGH (ref 70–99)
Potassium: 4 mEq/L (ref 3.5–5.1)
Total Bilirubin: 0.5 mg/dL (ref 0.3–1.2)

## 2012-07-28 LAB — CBC WITH DIFFERENTIAL/PLATELET
Basophils Relative: 0 % (ref 0–1)
HCT: 37.4 % — ABNORMAL LOW (ref 39.0–52.0)
Hemoglobin: 12.6 g/dL — ABNORMAL LOW (ref 13.0–17.0)
Lymphs Abs: 1.2 10*3/uL (ref 0.7–4.0)
MCHC: 33.7 g/dL (ref 30.0–36.0)
Monocytes Absolute: 0.5 10*3/uL (ref 0.1–1.0)
Monocytes Relative: 10 % (ref 3–12)
Neutro Abs: 3.6 10*3/uL (ref 1.7–7.7)
RBC: 4.06 MIL/uL — ABNORMAL LOW (ref 4.22–5.81)

## 2012-07-28 LAB — TROPONIN I: Troponin I: <0.3 ng/mL (ref <0.30)

## 2012-07-28 NOTE — ED Notes (Signed)
The pt has a pacemaker placed June and at the first of the month his heart has been racing intermittently.  He was hospitalized 2 weeks ago for the same.  Today his heart has been racing.  No pain no sob.  Pt alert very pale.

## 2012-07-28 NOTE — ED Notes (Signed)
Patient is CAOx3, HOH, pale upon arrival to ED.  Patient is being placed on cardiac monitor at this time.  Patient denies any pain at this time.  Wife states that he has a pacemaker, not working correctly.

## 2012-07-28 NOTE — ED Notes (Addendum)
Pt states he has been "feeling his heart racing".  Denies pain, SOB.  States they pacemaker has not shocked him.  St. Jude's pacemaker since 02/08/2012.  States he felt weak this morning and "felt his heart fibrillating".

## 2012-07-28 NOTE — ED Provider Notes (Signed)
History    CSN: [id number] Arrival date & time 07/28/12  1444 First MD Initiated Contact with Patient 07/28/12 1932      Chief Complaint  Patient presents with  . pacemaker not working     HPI Pt complains of palpitations since yesterday.  He states he felt like his heart was racing and beating irregularly.   Other than that he feels pretty good.  No chest pain or shortness of breath.  NO syncope although a little lightheaded this morning.  Pt was in the hospital last month for simlar symptoms.  He is not aware that he has a history of a fib.    Pt sees [facility] but was not able to see them. Past Medical History  Diagnosis Date  . Depression   . Aneurysm of abdominal aorta     [facility], Dr. Royann Shivers  . History of colon cancer     hospitalization 1999  . Degenerative disc disease, lumbar   . History of non-Hodgkin's lymphoma     hospitalization 2002  . Cataract   . Hard of hearing     wears hearing aids  . Hypertension   . Hyperlipidemia   . Thrombocytopenia     Dr. Darnelle Catalan  . Chronic renal disease   . Memory loss   . Hyperglycemia   . Edema   . Heart murmur     Echocardiogram 7/10, stress test 2008, [facility]  . Overactive bladder   . Anemia     Dr. Darnelle Catalan  . AV block, 2nd degree 02/06/2012  . Lymphoma, history of 02/06/2012  . H/O colostomy 02/06/2012  . History of AAA (abdominal aortic aneurysm) repair 02/06/2012  . CKD (chronic kidney disease) stage 3, GFR 30-59 ml/min 02/06/2012  . Atrial fibrillation 01/2012    cardioversion to NSR  . Pacemaker 02/08/2012    [employer] DR RF dual-chamber pacemaker. Dr. Hillis Range    Past Surgical History  Procedure Date  . Colostomy   . Cardioversion 02/09/2012    Procedure: CARDIOVERSION;  Surgeon: Chrystie Nose, MD;  Location: [facility] OR;  Service: Cardiovascular;  Laterality: N/A;  . Pacemaker placement 02/08/2012    Dr. Hillis Range ([facility])    No family history on file.  History  Substance Use Topics  .  Smoking status: Former Smoker    Quit date: 08/17/1967  . Smokeless tobacco: Never Used  . Alcohol Use: 0.6 oz/week    1 Cans of beer per week      Review of Systems  All other systems reviewed and are negative.    Allergies  Review of patient's allergies indicates no known allergies.  Home Medications   Current Outpatient Rx  Name  Route  Sig  Dispense  Refill  . AMLODIPINE BESYLATE-VALSARTAN 5-160 MG PO TABS   Oral   Take 1 tablet by mouth every evening.   30 tablet   5   . APIXABAN 5 MG PO TABS   Oral   Take 0.5 tablets (2.5 mg total) by mouth 2 (two) times daily.   60 tablet   2   . ASPIRIN 81 MG PO CHEW   Oral   Chew 1 tablet (81 mg total) by mouth daily.         Marland Kitchen ESCITALOPRAM OXALATE 20 MG PO TABS   Oral   Take 1 tablet (20 mg total) by mouth daily.   90 tablet   0   . METOPROLOL TARTRATE 25 MG PO TABS  Oral   Take 1 tablet (25 mg total) by mouth 2 (two) times daily.   60 tablet   5   . NIACIN ER (ANTIHYPERLIPIDEMIC) 500 MG PO TBCR   Oral   Take 1 tablet (500 mg total) by mouth at bedtime.   30 tablet   11   . PANTOPRAZOLE SODIUM 40 MG PO TBEC   Oral   Take 1 tablet (40 mg total) by mouth daily at 12 noon.   30 tablet   5   . SILODOSIN 8 MG PO CAPS   Oral   Take 1 capsule (8 mg total) by mouth every morning.   90 capsule   2     BP 102/68  Pulse 70  Temp 98.2 F (36.8 C)  Resp 20  SpO2 100%  Physical Exam  Nursing note and vitals reviewed. Constitutional: He appears well-developed and well-nourished. No distress.  HENT:  Head: Normocephalic and atraumatic.  Right Ear: External ear normal.  Left Ear: External ear normal.  Eyes: Conjunctivae normal are normal. Right eye exhibits no discharge. Left eye exhibits no discharge. No scleral icterus.  Neck: Neck supple. No tracheal deviation present.  Cardiovascular: Intact distal pulses.  An irregularly irregular rhythm present.  Pulmonary/Chest: Effort normal and breath sounds  normal. No stridor. No respiratory distress. He has no wheezes. He has no rales.  Abdominal: Soft. Bowel sounds are normal. He exhibits no distension. There is no tenderness. There is no rebound and no guarding.  Musculoskeletal: He exhibits no edema and no tenderness.  Neurological: He is alert. He has normal strength. No sensory deficit. Cranial nerve deficit:  no gross defecits noted. He exhibits normal muscle tone. He displays no seizure activity. Coordination normal.  Skin: Skin is warm and dry. No rash noted.  Psychiatric: He has a normal mood and affect.    ED Course  Procedures (including critical care time) EKG Rate 73 Ventricular-paced rhythm Abnormal ECG A fib present on prior ekg Labs Reviewed  CBC WITH DIFFERENTIAL - Abnormal; Notable for the following:    RBC 4.06 (*)     Hemoglobin 12.6 (*)     HCT 37.4 (*)     All other components within normal limits  COMPREHENSIVE METABOLIC PANEL - Abnormal; Notable for the following:    Glucose, Bld 123 (*)     Albumin 3.4 (*)     GFR calc non Af Amer 48 (*)     GFR calc Af Amer 55 (*)     All other components within normal limits  TROPONIN I   No results found.   1. Atrial fibrillation       MDM  Pt has known a fib with RVR.  He appears to be in a fib today.  Normal rate.  Pt is feeling well.  Would like to go home.  Will dc home to follow up with his cardiologist.  pacemakar interrogation performed but results were not available at time of discharge.        Celene Kras, MD 07/29/12 [phone]

## 2012-08-02 ENCOUNTER — Encounter: Payer: Self-pay | Admitting: Medical

## 2012-08-17 ENCOUNTER — Telehealth: Payer: Self-pay | Admitting: Family Medicine

## 2012-08-17 MED ORDER — SILODOSIN 8 MG PO CAPS: 8.0000 mg | ORAL_CAPSULE | ORAL | Status: AC

## 2012-08-17 NOTE — Telephone Encounter (Signed)
Rapaflo renewed

## 2012-09-16 DIAGNOSIS — J189 Pneumonia, unspecified organism: Secondary | ICD-10-CM

## 2012-09-16 HISTORY — DX: Pneumonia, unspecified organism: J18.9

## 2012-09-17 ENCOUNTER — Other Ambulatory Visit: Payer: Self-pay | Admitting: Medical

## 2012-09-18 ENCOUNTER — Encounter: Payer: Self-pay | Admitting: *Deleted

## 2012-09-18 NOTE — Telephone Encounter (Signed)
Is this okay?

## 2012-09-19 ENCOUNTER — Encounter (HOSPITAL_COMMUNITY): Payer: Self-pay | Admitting: Anesthesiology

## 2012-09-19 ENCOUNTER — Encounter (HOSPITAL_COMMUNITY)
Admission: RE | Disposition: A | Discharge status: Home / Self Care | Payer: Self-pay | Source: Ambulatory Visit | Attending: Cardiology

## 2012-09-19 ENCOUNTER — Ambulatory Visit (HOSPITAL_COMMUNITY)
Admission: RE | Admit: 2012-09-19 | Discharge: 2012-09-19 | Disposition: A | Discharge status: Home / Self Care | Payer: Medicare Other | Source: Ambulatory Visit | Attending: Cardiology | Admitting: Cardiology

## 2012-09-19 DIAGNOSIS — I4891 Unspecified atrial fibrillation: Secondary | ICD-10-CM | POA: Insufficient documentation

## 2012-09-19 DIAGNOSIS — Z5309 Procedure and treatment not carried out because of other contraindication: Secondary | ICD-10-CM | POA: Insufficient documentation

## 2012-09-19 SURGERY — CANCELLED PROCEDURE

## 2012-09-19 MED ORDER — HYDROCORTISONE 1 % EX CREA: 1.0000 "application " | TOPICAL_CREAM | Freq: Three times a day (TID) | CUTANEOUS | Status: DC | PRN

## 2012-09-19 MED ORDER — SODIUM CHLORIDE 0.9 % IV SOLN: INTRAVENOUS | Status: DC

## 2012-09-19 MED ORDER — SODIUM CHLORIDE 0.9 % IJ SOLN: 3.0000 mL | INTRAMUSCULAR | Status: DC | PRN

## 2012-09-19 NOTE — Progress Notes (Signed)
Patient is here for cardioversion for atrial fib, patient hooked to the monitor, patient is in sinus arrythymia, paged Dr croitoru, made aware of the rhythm. cardioversion was cancelled.

## 2012-09-19 NOTE — H&P (Signed)
Patient presented for elective cardioversion, but found to be in NSR. Procedure cancelled.

## 2012-09-19 NOTE — Anesthesia Preprocedure Evaluation (Deleted)
Anesthesia Evaluation  Patient identified by MRN, date of birth, ID band Patient awake    Reviewed: Allergy & Precautions, H&P , NPO status , Patient's Chart, lab work & pertinent test results, reviewed documented beta blocker date and time   History of Anesthesia Complications Negative for: history of anesthetic complications  Airway       Dental   Pulmonary former smoker,          Cardiovascular hypertension, Pt. on medications and Pt. on home beta blockers + Peripheral Vascular Disease + dysrhythmias Atrial Fibrillation + pacemaker + Valvular Problems/Murmurs     Neuro/Psych PSYCHIATRIC DISORDERS Depression  Neuromuscular disease    GI/Hepatic   Endo/Other    Renal/GU Renal InsufficiencyRenal disease     Musculoskeletal   Abdominal   Peds  Hematology   Anesthesia Other Findings   Reproductive/Obstetrics                         Anesthesia Physical Anesthesia Plan Anesthesia Quick Evaluation

## 2012-09-19 NOTE — Preoperative (Signed)
Beta Blockers   Reason not to administer Beta Blockers:Not Applicable 

## 2012-09-22 ENCOUNTER — Emergency Department (HOSPITAL_COMMUNITY): Payer: Medicare Other

## 2012-09-22 ENCOUNTER — Encounter (HOSPITAL_COMMUNITY): Payer: Self-pay | Admitting: *Deleted

## 2012-09-22 ENCOUNTER — Telehealth: Payer: Self-pay | Admitting: Medical

## 2012-09-22 ENCOUNTER — Inpatient Hospital Stay (HOSPITAL_COMMUNITY)
Admission: EM | Admit: 2012-09-22 | Discharge: 2012-09-29 | DRG: 291 | Disposition: A | Discharge status: Skilled Nursing Facility | Payer: Medicare Other | Attending: Internal Medicine | Admitting: Internal Medicine

## 2012-09-22 DIAGNOSIS — F3289 Other specified depressive episodes: Secondary | ICD-10-CM | POA: Diagnosis present

## 2012-09-22 DIAGNOSIS — I441 Atrioventricular block, second degree: Secondary | ICD-10-CM

## 2012-09-22 DIAGNOSIS — R413 Other amnesia: Secondary | ICD-10-CM

## 2012-09-22 DIAGNOSIS — C4490 Unspecified malignant neoplasm of skin, unspecified: Secondary | ICD-10-CM

## 2012-09-22 DIAGNOSIS — F32A Depression, unspecified: Secondary | ICD-10-CM

## 2012-09-22 DIAGNOSIS — M545 Low back pain, unspecified: Secondary | ICD-10-CM

## 2012-09-22 DIAGNOSIS — M5137 Other intervertebral disc degeneration, lumbosacral region: Secondary | ICD-10-CM

## 2012-09-22 DIAGNOSIS — I4891 Unspecified atrial fibrillation: Secondary | ICD-10-CM

## 2012-09-22 DIAGNOSIS — N400 Enlarged prostate without lower urinary tract symptoms: Secondary | ICD-10-CM

## 2012-09-22 DIAGNOSIS — I5033 Acute on chronic diastolic (congestive) heart failure: Principal | ICD-10-CM | POA: Diagnosis present

## 2012-09-22 DIAGNOSIS — I480 Paroxysmal atrial fibrillation: Secondary | ICD-10-CM

## 2012-09-22 DIAGNOSIS — J441 Chronic obstructive pulmonary disease with (acute) exacerbation: Secondary | ICD-10-CM | POA: Diagnosis present

## 2012-09-22 DIAGNOSIS — I5031 Acute diastolic (congestive) heart failure: Secondary | ICD-10-CM | POA: Diagnosis present

## 2012-09-22 DIAGNOSIS — H8110 Benign paroxysmal vertigo, unspecified ear: Secondary | ICD-10-CM

## 2012-09-22 DIAGNOSIS — W19XXXA Unspecified fall, initial encounter: Secondary | ICD-10-CM

## 2012-09-22 DIAGNOSIS — Z950 Presence of cardiac pacemaker: Secondary | ICD-10-CM

## 2012-09-22 DIAGNOSIS — D696 Thrombocytopenia, unspecified: Secondary | ICD-10-CM

## 2012-09-22 DIAGNOSIS — Z22322 Carrier or suspected carrier of Methicillin resistant Staphylococcus aureus: Secondary | ICD-10-CM

## 2012-09-22 DIAGNOSIS — M549 Dorsalgia, unspecified: Secondary | ICD-10-CM

## 2012-09-22 DIAGNOSIS — I129 Hypertensive chronic kidney disease with stage 1 through stage 4 chronic kidney disease, or unspecified chronic kidney disease: Secondary | ICD-10-CM | POA: Diagnosis present

## 2012-09-22 DIAGNOSIS — I10 Essential (primary) hypertension: Secondary | ICD-10-CM

## 2012-09-22 DIAGNOSIS — R42 Dizziness and giddiness: Secondary | ICD-10-CM

## 2012-09-22 DIAGNOSIS — H269 Unspecified cataract: Secondary | ICD-10-CM

## 2012-09-22 DIAGNOSIS — J9600 Acute respiratory failure, unspecified whether with hypoxia or hypercapnia: Secondary | ICD-10-CM

## 2012-09-22 DIAGNOSIS — N318 Other neuromuscular dysfunction of bladder: Secondary | ICD-10-CM

## 2012-09-22 DIAGNOSIS — F4321 Adjustment disorder with depressed mood: Secondary | ICD-10-CM

## 2012-09-22 DIAGNOSIS — E785 Hyperlipidemia, unspecified: Secondary | ICD-10-CM

## 2012-09-22 DIAGNOSIS — G8929 Other chronic pain: Secondary | ICD-10-CM

## 2012-09-22 DIAGNOSIS — H539 Unspecified visual disturbance: Secondary | ICD-10-CM

## 2012-09-22 DIAGNOSIS — C8590 Non-Hodgkin lymphoma, unspecified, unspecified site: Secondary | ICD-10-CM

## 2012-09-22 DIAGNOSIS — R198 Other specified symptoms and signs involving the digestive system and abdomen: Secondary | ICD-10-CM

## 2012-09-22 DIAGNOSIS — R609 Edema, unspecified: Secondary | ICD-10-CM

## 2012-09-22 DIAGNOSIS — R410 Disorientation, unspecified: Secondary | ICD-10-CM

## 2012-09-22 DIAGNOSIS — IMO0002 Reserved for concepts with insufficient information to code with codable children: Secondary | ICD-10-CM

## 2012-09-22 DIAGNOSIS — Z85038 Personal history of other malignant neoplasm of large intestine: Secondary | ICD-10-CM

## 2012-09-22 DIAGNOSIS — R7989 Other specified abnormal findings of blood chemistry: Secondary | ICD-10-CM

## 2012-09-22 DIAGNOSIS — I714 Abdominal aortic aneurysm, without rupture, unspecified: Secondary | ICD-10-CM

## 2012-09-22 DIAGNOSIS — R7309 Other abnormal glucose: Secondary | ICD-10-CM

## 2012-09-22 DIAGNOSIS — I509 Heart failure, unspecified: Secondary | ICD-10-CM | POA: Diagnosis present

## 2012-09-22 DIAGNOSIS — R0602 Shortness of breath: Secondary | ICD-10-CM

## 2012-09-22 DIAGNOSIS — J189 Pneumonia, unspecified organism: Secondary | ICD-10-CM

## 2012-09-22 DIAGNOSIS — C189 Malignant neoplasm of colon, unspecified: Secondary | ICD-10-CM

## 2012-09-22 DIAGNOSIS — Z7901 Long term (current) use of anticoagulants: Secondary | ICD-10-CM

## 2012-09-22 DIAGNOSIS — E291 Testicular hypofunction: Secondary | ICD-10-CM

## 2012-09-22 DIAGNOSIS — N401 Enlarged prostate with lower urinary tract symptoms: Secondary | ICD-10-CM

## 2012-09-22 DIAGNOSIS — M51379 Other intervertebral disc degeneration, lumbosacral region without mention of lumbar back pain or lower extremity pain: Secondary | ICD-10-CM

## 2012-09-22 DIAGNOSIS — F329 Major depressive disorder, single episode, unspecified: Secondary | ICD-10-CM

## 2012-09-22 DIAGNOSIS — N183 Chronic kidney disease, stage 3 unspecified: Secondary | ICD-10-CM

## 2012-09-22 DIAGNOSIS — Z87891 Personal history of nicotine dependence: Secondary | ICD-10-CM

## 2012-09-22 LAB — URINALYSIS, ROUTINE W REFLEX MICROSCOPIC
Bilirubin Urine: NEGATIVE
Glucose, UA: NEGATIVE mg/dL
Hgb urine dipstick: NEGATIVE
Specific Gravity, Urine: 1.027 (ref 1.005–1.030)
pH: 5.5 (ref 5.0–8.0)

## 2012-09-22 LAB — URINE MICROSCOPIC-ADD ON

## 2012-09-22 LAB — CBC WITH DIFFERENTIAL/PLATELET
Basophils Absolute: 0 10*3/uL (ref 0.0–0.1)
Basophils Relative: 0 % (ref 0–1)
Eosinophils Relative: 2 % (ref 0–5)
Lymphocytes Relative: 6 % — ABNORMAL LOW (ref 12–46)
MCHC: 34.6 g/dL (ref 30.0–36.0)
MCV: 90.1 fL (ref 78.0–100.0)
Platelets: 224 10*3/uL (ref 150–400)
RDW: 14.6 % (ref 11.5–15.5)
WBC: 10.1 10*3/uL (ref 4.0–10.5)

## 2012-09-22 LAB — COMPREHENSIVE METABOLIC PANEL
ALT: 41 U/L (ref 0–53)
AST: 47 U/L — ABNORMAL HIGH (ref 0–37)
Albumin: 2.7 g/dL — ABNORMAL LOW (ref 3.5–5.2)
CO2: 23 mEq/L (ref 19–32)
Calcium: 9.2 mg/dL (ref 8.4–10.5)
GFR calc non Af Amer: 52 mL/min — ABNORMAL LOW (ref >90)
Sodium: 140 mEq/L (ref 135–145)
Total Protein: 6.6 g/dL (ref 6.0–8.3)

## 2012-09-22 LAB — PROTIME-INR
INR: 1.33 (ref 0.00–1.49)
Prothrombin Time: 16.2 seconds — ABNORMAL HIGH (ref 11.6–15.2)

## 2012-09-22 LAB — TROPONIN I: Troponin I: <0.3 ng/mL (ref <0.30)

## 2012-09-22 MED ORDER — METHYLPREDNISOLONE SODIUM SUCC 125 MG IJ SOLR
125.0000 mg | Freq: Once | INTRAMUSCULAR | Status: AC
  Administered 2012-09-22: 125 mg via INTRAVENOUS
  Filled 2012-09-22: qty 2

## 2012-09-22 MED ORDER — ACETAMINOPHEN 325 MG PO TABS: 650.0000 mg | ORAL_TABLET | Freq: Four times a day (QID) | ORAL | Status: DC | PRN

## 2012-09-22 MED ORDER — APIXABAN 2.5 MG PO TABS
2.5000 mg | ORAL_TABLET | Freq: Two times a day (BID) | ORAL | Status: DC
  Administered 2012-09-22 – 2012-09-29 (×14): 2.5 mg via ORAL
  Filled 2012-09-22 (×15): qty 1

## 2012-09-22 MED ORDER — ONDANSETRON HCL 4 MG/2ML IJ SOLN: 4.0000 mg | Freq: Four times a day (QID) | INTRAMUSCULAR | Status: DC | PRN

## 2012-09-22 MED ORDER — ESCITALOPRAM OXALATE 20 MG PO TABS
20.0000 mg | ORAL_TABLET | Freq: Every day | ORAL | Status: DC
  Administered 2012-09-23: 20 mg via ORAL
  Filled 2012-09-22: qty 1

## 2012-09-22 MED ORDER — VANCOMYCIN HCL IN DEXTROSE 1-5 GM/200ML-% IV SOLN
1000.0000 mg | Freq: Once | INTRAVENOUS | Status: DC
  Filled 2012-09-22: qty 200

## 2012-09-22 MED ORDER — SODIUM CHLORIDE 0.9 % IJ SOLN
3.0000 mL | Freq: Two times a day (BID) | INTRAMUSCULAR | Status: DC
  Administered 2012-09-23 – 2012-09-29 (×11): 3 mL via INTRAVENOUS

## 2012-09-22 MED ORDER — METOPROLOL TARTRATE 25 MG PO TABS
25.0000 mg | ORAL_TABLET | Freq: Two times a day (BID) | ORAL | Status: DC
  Administered 2012-09-22 – 2012-09-24 (×4): 25 mg via ORAL
  Filled 2012-09-22 (×5): qty 1

## 2012-09-22 MED ORDER — DEXTROSE 5 % IV SOLN
1.0000 g | Freq: Three times a day (TID) | INTRAVENOUS | Status: DC
  Filled 2012-09-22 (×2): qty 1

## 2012-09-22 MED ORDER — ACETAMINOPHEN 650 MG RE SUPP: 650.0000 mg | Freq: Four times a day (QID) | RECTAL | Status: DC | PRN

## 2012-09-22 MED ORDER — AMIODARONE HCL 200 MG PO TABS
200.0000 mg | ORAL_TABLET | Freq: Every day | ORAL | Status: DC
  Administered 2012-09-23 – 2012-09-24 (×2): 200 mg via ORAL
  Filled 2012-09-22 (×2): qty 1

## 2012-09-22 MED ORDER — HALOPERIDOL LACTATE 5 MG/ML IJ SOLN
1.0000 mg | Freq: Four times a day (QID) | INTRAMUSCULAR | Status: DC | PRN
  Filled 2012-09-22: qty 0.2

## 2012-09-22 MED ORDER — TAMSULOSIN HCL 0.4 MG PO CAPS
0.4000 mg | ORAL_CAPSULE | Freq: Every day | ORAL | Status: DC
  Administered 2012-09-22: 0.4 mg via ORAL
  Filled 2012-09-22 (×2): qty 1

## 2012-09-22 MED ORDER — BIOTENE DRY MOUTH MT LIQD
15.0000 mL | Freq: Two times a day (BID) | OROMUCOSAL | Status: DC
  Administered 2012-09-22 – 2012-09-29 (×13): 15 mL via OROMUCOSAL

## 2012-09-22 MED ORDER — PIPERACILLIN-TAZOBACTAM 3.375 G IVPB 30 MIN
3.3750 g | Freq: Once | INTRAVENOUS | Status: AC
  Administered 2012-09-22: 3.375 g via INTRAVENOUS
  Filled 2012-09-22: qty 50

## 2012-09-22 MED ORDER — LEVOFLOXACIN IN D5W 750 MG/150ML IV SOLN
750.0000 mg | INTRAVENOUS | Status: AC
  Administered 2012-09-24 – 2012-09-28 (×3): 750 mg via INTRAVENOUS
  Filled 2012-09-22 (×3): qty 150

## 2012-09-22 MED ORDER — DEXTROSE 5 % IV SOLN
1.0000 g | INTRAVENOUS | Status: DC
  Administered 2012-09-22 – 2012-09-26 (×5): 1 g via INTRAVENOUS
  Filled 2012-09-22 (×7): qty 1

## 2012-09-22 MED ORDER — ALBUTEROL SULFATE (5 MG/ML) 0.5% IN NEBU
2.5000 mg | INHALATION_SOLUTION | RESPIRATORY_TRACT | Status: DC | PRN
  Administered 2012-09-22 (×2): 2.5 mg via RESPIRATORY_TRACT
  Filled 2012-09-22 (×2): qty 0.5

## 2012-09-22 MED ORDER — APIXABAN 5 MG PO TABS: 2.5000 mg | ORAL_TABLET | Freq: Two times a day (BID) | ORAL | Status: AC

## 2012-09-22 MED ORDER — PANTOPRAZOLE SODIUM 40 MG PO TBEC
40.0000 mg | DELAYED_RELEASE_TABLET | Freq: Every day | ORAL | Status: DC
  Administered 2012-09-23: 40 mg via ORAL

## 2012-09-22 MED ORDER — POLYETHYLENE GLYCOL 3350 17 G PO PACK
17.0000 g | PACK | Freq: Every day | ORAL | Status: DC | PRN
  Filled 2012-09-22: qty 1

## 2012-09-22 MED ORDER — VANCOMYCIN HCL 1000 MG IV SOLR
750.0000 mg | Freq: Two times a day (BID) | INTRAVENOUS | Status: DC
  Administered 2012-09-22 – 2012-09-25 (×6): 750 mg via INTRAVENOUS
  Filled 2012-09-22 (×7): qty 750

## 2012-09-22 MED ORDER — METHYLPREDNISOLONE SODIUM SUCC 125 MG IJ SOLR
60.0000 mg | Freq: Two times a day (BID) | INTRAMUSCULAR | Status: DC
  Administered 2012-09-22 – 2012-09-24 (×4): 60 mg via INTRAVENOUS
  Filled 2012-09-22 (×6): qty 0.96

## 2012-09-22 MED ORDER — SODIUM CHLORIDE 0.9 % IV SOLN
INTRAVENOUS | Status: AC
  Administered 2012-09-22 – 2012-09-23 (×2): via INTRAVENOUS

## 2012-09-22 MED ORDER — SODIUM CHLORIDE 0.9 % IV BOLUS (SEPSIS)
1000.0000 mL | Freq: Once | INTRAVENOUS | Status: AC
  Administered 2012-09-22: 1000 mL via INTRAVENOUS

## 2012-09-22 MED ORDER — LEVOFLOXACIN IN D5W 750 MG/150ML IV SOLN
750.0000 mg | Freq: Once | INTRAVENOUS | Status: DC
  Administered 2012-09-22: 750 mg via INTRAVENOUS
  Filled 2012-09-22: qty 150

## 2012-09-22 MED ORDER — LEVOFLOXACIN IN D5W 750 MG/150ML IV SOLN
750.0000 mg | INTRAVENOUS | Status: DC
  Filled 2012-09-22: qty 150

## 2012-09-22 MED ORDER — ONDANSETRON HCL 4 MG PO TABS: 4.0000 mg | ORAL_TABLET | Freq: Four times a day (QID) | ORAL | Status: DC | PRN

## 2012-09-22 NOTE — Progress Notes (Addendum)
ANTIBIOTIC CONSULT NOTE - INITIAL  Pharmacy Consult for Vancomycin Indication: rule out pneumonia  No Known Allergies  Patient Measurements:     Vital Signs: Temp: 97.7 F (36.5 C) (02/07 1458) BP: 151/70 mmHg (02/07 1855) Pulse Rate: 112  (02/07 1855) Intake/Output from previous day:   Intake/Output from this shift:    Labs:  Basename 09/22/12 1506  WBC 10.1  HGB 9.8*  PLT 224  LABCREA --  CREATININE 1.20   The CrCl is unknown because both a height and weight (above a minimum accepted value) are required for this calculation. No results found for this basename: VANCOTROUGH:2,VANCOPEAK:2,VANCORANDOM:2,GENTTROUGH:2,GENTPEAK:2,GENTRANDOM:2,TOBRATROUGH:2,TOBRAPEAK:2,TOBRARND:2,AMIKACINPEAK:2,AMIKACINTROU:2,AMIKACIN:2, in the last 72 hours   Microbiology: No results found for this or any previous visit (from the past 720 hour(s)).  Medical History: Past Medical History  Diagnosis Date  . Depression   . Aneurysm of abdominal aorta     [facility], Dr. Royann Shivers  . History of colon cancer     hospitalization 1999  . Degenerative disc disease, lumbar   . History of non-Hodgkin's lymphoma     hospitalization 2002  . Cataract   . Hard of hearing     wears hearing aids  . Hypertension   . Hyperlipidemia   . Thrombocytopenia     Dr. Darnelle Catalan  . Chronic renal disease   . Memory loss   . Hyperglycemia   . Edema   . Heart murmur     Echocardiogram 7/10, stress test 2008, [facility]  . Overactive bladder   . Anemia     Dr. Darnelle Catalan  . AV block, 2nd degree 02/06/2012  . Lymphoma, history of 02/06/2012  . H/O colostomy 02/06/2012  . History of AAA (abdominal aortic aneurysm) repair 02/06/2012  . CKD (chronic kidney disease) stage 3, GFR 30-59 ml/min 02/06/2012  . Atrial fibrillation 01/2012    cardioversion to NSR  . Pacemaker 02/08/2012    [employer] Acc[employer] DR RF dual-chamber pacemaker. Dr. Hillis Range    Medications:  Prescriptions prior to admission  Medication Sig  Dispense Refill  . amiodarone (PACERONE) 200 MG tablet Take 200 mg by mouth daily.      Marland Kitchen amLODipine-valsartan (EXFORGE) 5-160 MG per tablet Take 1 tablet by mouth every evening.  30 tablet  5  . atenolol (TENORMIN) 25 MG tablet Take 25 mg by mouth daily.      Marland Kitchen escitalopram (LEXAPRO) 20 MG tablet Take 1 tablet (20 mg total) by mouth daily.  90 tablet  0  . metoprolol tartrate (LOPRESSOR) 25 MG tablet Take 1 tablet (25 mg total) by mouth 2 (two) times daily.  60 tablet  5  . niacin (NIASPAN) 500 MG CR tablet Take 1 tablet (500 mg total) by mouth at bedtime.  30 tablet  11  . pantoprazole (PROTONIX) 40 MG tablet Take 1 tablet (40 mg total) by mouth daily at 12 noon.  30 tablet  5  . silodosin (RAPAFLO) 8 MG CAPS capsule Take 1 capsule (8 mg total) by mouth every morning.  90 capsule  2  . apixaban (ELIQUIS) 5 MG TABS tablet Take 0.5 tablets (2.5 mg total) by mouth 2 (two) times daily.  90 tablet  0   Assessment: [name] [name] is being admitted for worsening SOB and lethargy. Noted he has been in the hospital within the last 90 days, and as as result is being initiated on broad spectrum abx. Scr ok at baseline, WBC in nml. Also noted hx of Afib, s/p pacemaker, on Eliquis PTA. H/H low  at baseline, Plts are nml.  Goal of Therapy:  Vancomycin trough level 15-20 mcg/ml  Plan:  - Vancomycin 750mg  IV q 12h, first dose now, dose in ED was not given - Will put in Vanc duration of 8 days as ordered by MD - Will f/up to check Css Vanc trough as appropriate. - Will monitor cx/spec/sens, renal fn and clinical status daily. - Will adjust Cefepime to 1gm IV q 24 and Levaquin 750mg  IV q 48h for renal function, will maintain duration as ordered by MD.  Thanks, Wrigley Plasencia K. Allena Katz, PharmD, BCPS.  Clinical Pharmacist Pager [phone]. 09/22/2012 7:48 PM

## 2012-09-22 NOTE — ED Notes (Signed)
Per EMS:  Pt from home.  Pt on home 02-2L [place].  Started to have weakness several weeks ago.  Pt was put on amiodarone several weeks ago and has been weak since that time.

## 2012-09-22 NOTE — ED Notes (Signed)
Breathing treatment started d/t audible wheezing

## 2012-09-22 NOTE — ED Provider Notes (Signed)
History     CSN: [id number]  Arrival date & time 09/22/12  1450   First MD Initiated Contact with Patient 09/22/12 1503      Chief Complaint  Patient presents with  . Weakness    (Consider location/radiation/quality/duration/timing/severity/associated sxs/prior treatment) The history is provided by the patient.  [name] [name] is a 79 y.o. male history of depression, hypertension, A. fib status post pacemaker and on Eliquis here with weakness and SOB. Weakness for the last several weeks. Last week or so weakness was worse and he became short of breath on exertion. Shortness of breath when even when he walks to the bathroom. Has been using his wife's oxygen at home. No cough or fever. Also just diffuse weakness but he no urinary symptoms or normal pain or vomiting or diarrhea. Lives at home by himself without any help. He was also started on amiodarone several weeks ago as well.    Past Medical History  Diagnosis Date  . Depression   . Aneurysm of abdominal aorta     [facility], Dr. Royann Shivers  . History of colon cancer     hospitalization 1999  . Degenerative disc disease, lumbar   . History of non-Hodgkin's lymphoma     hospitalization 2002  . Cataract   . Hard of hearing     wears hearing aids  . Hypertension   . Hyperlipidemia   . Thrombocytopenia     Dr. Darnelle Catalan  . Chronic renal disease   . Memory loss   . Hyperglycemia   . Edema   . Heart murmur     Echocardiogram 7/10, stress test 2008, [facility]  . Overactive bladder   . Anemia     Dr. Darnelle Catalan  . AV block, 2nd degree 02/06/2012  . Lymphoma, history of 02/06/2012  . H/O colostomy 02/06/2012  . History of AAA (abdominal aortic aneurysm) repair 02/06/2012  . CKD (chronic kidney disease) stage 3, GFR 30-59 ml/min 02/06/2012  . Atrial fibrillation 01/2012    cardioversion to NSR  . Pacemaker 02/08/2012    St Jude Medical Accent DR RF dual-chamber pacemaker. Dr. Hillis Range    Past Surgical History  Procedure Date  .  Colostomy   . Cardioversion 02/09/2012    Procedure: CARDIOVERSION;  Surgeon: Chrystie Nose, MD;  Location: [facility] OR;  Service: Cardiovascular;  Laterality: N/A;  . Pacemaker placement 02/08/2012    Dr. Hillis Range ([facility])    History reviewed. No pertinent family history.  History  Substance Use Topics  . Smoking status: Former Smoker    Quit date: 08/17/1967  . Smokeless tobacco: Never Used  . Alcohol Use: 0.6 oz/week    1 Cans of beer per week      Review of Systems  Respiratory: Positive for shortness of breath.   Neurological: Positive for weakness.  All other systems reviewed and are negative.    Allergies  Review of patient's allergies indicates no known allergies.  Home Medications   Current Outpatient Rx  Name  Route  Sig  Dispense  Refill  . AMIODARONE HCL 200 MG PO TABS   Oral   Take 200 mg by mouth daily.         Marland Kitchen AMLODIPINE BESYLATE-VALSARTAN 5-160 MG PO TABS   Oral   Take 1 tablet by mouth every evening.   30 tablet   5   . APIXABAN 5 MG PO TABS   Oral   Take 0.5 tablets (2.5 mg total) by mouth 2 (two) times  daily.   60 tablet   2   . ATENOLOL 25 MG PO TABS   Oral   Take 25 mg by mouth daily.         Marland Kitchen ESCITALOPRAM OXALATE 20 MG PO TABS   Oral   Take 1 tablet (20 mg total) by mouth daily.   90 tablet   0   . METOPROLOL TARTRATE 25 MG PO TABS   Oral   Take 1 tablet (25 mg total) by mouth 2 (two) times daily.   60 tablet   5   . NIACIN ER (ANTIHYPERLIPIDEMIC) 500 MG PO TBCR   Oral   Take 1 tablet (500 mg total) by mouth at bedtime.   30 tablet   11   . PANTOPRAZOLE SODIUM 40 MG PO TBEC   Oral   Take 1 tablet (40 mg total) by mouth daily at 12 noon.   30 tablet   5   . SILODOSIN 8 MG PO CAPS   Oral   Take 1 capsule (8 mg total) by mouth every morning.   90 capsule   2     BP 118/75  Pulse 103  Temp 97.7 F (36.5 C)  Resp 24  SpO2 90%  Physical Exam  Nursing note and vitals reviewed. Constitutional: He is  oriented to person, place, and time.       Tired   HENT:  Head: Normocephalic.  Mouth/Throat: Oropharynx is clear and moist.  Eyes: Conjunctivae normal are normal. Pupils are equal, round, and reactive to light.  Neck: Normal range of motion. Neck supple.  Cardiovascular: Normal rate, regular rhythm and normal heart sounds.   Pulmonary/Chest:       Some crackles L base. Mod air movement.   Abdominal: Soft. Bowel sounds are normal. He exhibits no distension. There is no tenderness. There is no rebound.  Musculoskeletal: Normal range of motion.  Neurological: He is alert and oriented to person, place, and time.  Skin: Skin is warm and dry.  Psychiatric: He has a normal mood and affect. His behavior is normal. Judgment and thought content normal.    ED Course  Procedures (including critical care time)  Labs Reviewed  CBC WITH DIFFERENTIAL - Abnormal; Notable for the following:    RBC 3.14 (*)     Hemoglobin 9.8 (*)     HCT 28.3 (*)     Neutrophils Relative 82 (*)     Neutro Abs 8.3 (*)     Lymphocytes Relative 6 (*)     Lymphs Abs 0.6 (*)     Monocytes Absolute 1.1 (*)     All other components within normal limits  COMPREHENSIVE METABOLIC PANEL - Abnormal; Notable for the following:    Glucose, Bld 161 (*)     Albumin 2.7 (*)     AST 47 (*)     GFR calc non Af Amer 52 (*)     GFR calc Af Amer 60 (*)     All other components within normal limits  PROTIME-INR - Abnormal; Notable for the following:    Prothrombin Time 16.2 (*)     All other components within normal limits  TROPONIN I  URINALYSIS, ROUTINE W REFLEX MICROSCOPIC   Dg Chest 2 View  09/22/2012  *RADIOLOGY REPORT*  Clinical Data: Shortness of breath, chest pain.  CHEST - 2 VIEW  Comparison: July 09, 2012.  Findings: Stable cardiomegaly.  No change in position of left-sided pacemaker.  Interval development of a large right  upper lobe opacity as well as smaller left perihilar opacity concerning for pneumonia or  possibly edema.  IMPRESSION: Interval development of bilateral upper lobe opacities with right worse than left, concerning for pneumonia or possibly edema.   Original Report Authenticated By: Lupita Raider.,  M.D.    Ct Head Wo Contrast  09/22/2012  *RADIOLOGY REPORT*  Clinical Data: Weakness  CT HEAD WITHOUT CONTRAST  Technique:  Contiguous axial images were obtained from the base of the skull through the vertex without contrast.  Comparison: CT head 07/15/2008.  MRI 08/11/2011  Findings: Age appropriate atrophy.  Negative for acute infarct. Negative for hemorrhage or mass.  No fluid collection or shift of the midline structures.  Calvarium shows no acute abnormality. Atherosclerotic calcification in the vertebral and carotid arteries bilaterally.  IMPRESSION: No acute intracranial abnormality.   Original Report Authenticated By: Janeece Riggers, M.D.      No diagnosis found.   Date: 09/22/2012  Rate: 86  Rhythm: paced with underlying afib   QRS Axis: normal  Intervals: normal  ST/T Wave abnormalities: normal  Conduction Disutrbances:none  Narrative Interpretation:   Old EKG Reviewed: none available    MDM  [name] [name] is a 79 y.o. male here with SOB and weakness. Will consider ACS given multiple cardiac risk factors. Will also consider sepsis given weakness. Will get labs and may need admission.   4:57 PM CXR showed pneumonia. Trop neg x 1. I ordered levaquin initially, but Dr. Radonna Ricker said that he was admitted less than 3 months ago and will need HCAP coverage. I ordered vanc/zosyn.        Richardean Canal, MD 09/22/12 [phone]

## 2012-09-22 NOTE — Telephone Encounter (Signed)
pls call out 90 day supply.  I though I already sent this.

## 2012-09-22 NOTE — H&P (Addendum)
[facility] History and Physical  [name] [name] RUE:[id number] DOB: Dec 30, 1921 DOA: 09/22/2012  Referring physician: Dr. Silverio [name] PCP: [name] Herter, [name]  Specialists: none  Chief Complaint: SOB  HPI: [name] [name] is a 79 y.o. male  With past medical history of colon cancer, hypertension, A. fib status post pacemaker placement, currently on [name], history of colon cancer S/p surgery that came in because of shortness of breath and cough that started 2 weeks prior to admission. He relates he started feeding short of breath and then developed a cough which has progressively gotten worse to the point where he can't even get out of bed without being short of breath. He relates he's never been so tired and his wife. He relates no fevers. No diarrhea, chest pain. He relates no night sweats. Today when he got up she was so tired he can move from his bed so she decided to call EMS and was brought here to the hospital. He had been using his wife's oxygen at home.  Review of Systems: The patient denies anorexia, fever, weight loss,, vision loss, decreased hearing, hoarseness, chest pain, syncope, dyspnea on exertion, peripheral edema, balance deficits, hemoptysis, abdominal pain, melena, hematochezia, severe indigestion/heartburn, hematuria, incontinence, genital sores, muscle weakness, suspicious skin lesions, transient blindness, difficulty walking, depression, unusual weight change, abnormal bleeding, enlarged lymph nodes, angioedema, and breast masses.    Past Medical History  Diagnosis Date  . Depression   . Aneurysm of abdominal aorta     [facility], [name] [name]  . History of colon cancer     hospitalization 1999  . Degenerative disc disease, lumbar   . History of non-Hodgkin's lymphoma     hospitalization 2002  . Cataract   . Hard of hearing     wears hearing aids  . Hypertension   . Hyperlipidemia   . Thrombocytopenia     [name] [name]  . Chronic renal disease   . Memory loss    . Hyperglycemia   . Edema   . Heart murmur     Echocardiogram 7/10, stress test 2008, [facility]  . Overactive bladder   . Anemia     [name] [name]  . AV block, 2nd degree 02/06/2012  . Lymphoma, history of 02/06/2012  . H/O colostomy 02/06/2012  . History of AAA (abdominal aortic aneurysm) repair 02/06/2012  . CKD (chronic kidney disease) stage 3, GFR 30-59 ml/min 02/06/2012  . Atrial fibrillation 01/2012    cardioversion to NSR  . Pacemaker 02/08/2012    [employer] DR RF dual-chamber pacemaker. [name] [name]   Past Surgical History  Procedure Date  . Colostomy   . Cardioversion 02/09/2012    Procedure: CARDIOVERSION;  Surgeon: [name] [name];  Location: [facility] OR;  Service: Cardiovascular;  Laterality: N/A;  . Pacemaker placement 02/08/2012    [name] [name] (SJM)   Social History:  reports that he quit smoking about 45 years ago. His smoking use included Cigarettes. He smoked 0 packs per day. He has never used smokeless tobacco. He reports that he drinks about .6 ounces of alcohol per week. He reports that he does not use illicit drugs. Lives at home with his wife  No Known Allergies  Family History  Problem Relation Age of Onset  . Other Mother   . Other Father     Prior to Admission medications   Medication Sig Start Date End Date Taking? Authorizing Provider  amiodarone (PACERONE) 200 MG tablet Take 200 mg by mouth  daily.   Yes [name] [name]  amLODipine-valsartan (EXFORGE) 5-160 MG per tablet Take 1 tablet by mouth every evening. 07/18/12  Yes [name] [name]  apixaban (ELIQUIS) 5 MG TABS tablet Take 0.5 tablets (2.5 mg total) by mouth 2 (two) times daily. 02/28/12  Yes [name] Balo Tysinger, [name] [name]  atenolol (TENORMIN) 25 MG tablet Take 25 mg by mouth daily.   Yes [name] [name]  escitalopram (LEXAPRO) 20 MG tablet Take 1 tablet (20 mg total) by mouth daily. 06/16/12  Yes [name] [name]  metoprolol tartrate (LOPRESSOR) 25 MG tablet Take 1 tablet  (25 mg total) by mouth 2 (two) times daily. 07/10/12  Yes [name] [name]  niacin (NIASPAN) 500 MG CR tablet Take 1 tablet (500 mg total) by mouth at bedtime. 03/27/12  Yes [name] [name]  pantoprazole (PROTONIX) 40 MG tablet Take 1 tablet (40 mg total) by mouth daily at 12 noon. 07/10/12  Yes [name] [name]  silodosin (RAPAFLO) 8 MG CAPS capsule Take 1 capsule (8 mg total) by mouth every morning. 08/17/12  Yes [name] [name]   Physical Exam: Filed Vitals:   09/22/12 1458 09/22/12 1515  BP: 118/75 132/67  Pulse: 103 84  Temp: 97.7 F (36.5 C)   Resp: 24 32  SpO2: 90% 96%   BP 132/67  Pulse 84  Temp 97.7 F (36.5 C)  Resp 32  SpO2 96%  General Appearance:    Alert, cooperative, no distress, appears stated age  Head:    Normocephalic, without obvious abnormality, atraumatic  Eyes:    PERRL, conjunctiva/corneas clear, EOM's intact, fundi    benign, both eyes       Ears:    Normal TM's and external ear canals, both ears  [name]:   Nares normal, septum midline, mucosa normal, no drainage    or sinus tenderness  Throat:   Lips, mucosa, and tongue are dry   Neck:   Supple, symmetrical, trachea midline, no adenopathy;       thyroid:  No enlargement/tenderness/nodules; no carotid   bruit or JVD  Back:     Symmetric, no curvature, ROM normal, no CVA tenderness  Lungs:     moderate movement with crackles in his right upper lung and left upper lung with wheezing bilaterally.   Chest wall:    No tenderness or deformity  Heart:    Regular rate and rhythm, S1 and S2 normal, aortic area murmur, rub   or gallop  Abdomen:     Soft, non-tender, bowel sounds active all four quadrants,    no masses, no organomegaly with a colostomy bag in the left lower quadrant         Extremities:   Extremities normal, atraumatic, no cyanosis or edema  Pulses:   2+ and symmetric all extremities  Skin:   Skin color, texture, turgor normal, no rashes or lesions  Lymph nodes:   Cervical, supraclavicular,  and axillary nodes normal  Neurologic:   CNII-XII intact. Normal strength, sensation and reflexes      throughout    Labs on Admission:  Basic Metabolic Panel:  Lab 09/22/12 1610  NA 140  K 3.5  CL 104  CO2 23  GLUCOSE 161*  BUN 22  CREATININE 1.20  CALCIUM 9.2  MG --  PHOS --   Liver Function Tests:  Lab 09/22/12 1506  AST 47*  ALT 41  ALKPHOS 81  BILITOT 0.6  PROT 6.6  ALBUMIN 2.7*  No results found for this basename: LIPASE:5,AMYLASE:5 in the last 168 hours No results found for this basename: AMMONIA:5 in the last 168 hours CBC:  Lab 09/22/12 1506  WBC 10.1  NEUTROABS 8.3*  HGB 9.8*  HCT 28.3*  MCV 90.1  PLT 224   Cardiac Enzymes:  Lab 09/22/12 1517  CKTOTAL --  CKMB --  CKMBINDEX --  TROPONINI <0.30    BNP (last 3 results)  Basename 02/06/12 1416  PROBNP 3850.0*   CBG: No results found for this basename: GLUCAP:5 in the last 168 hours  Radiological Exams on Admission: Dg Chest 2 View  09/22/2012  *RADIOLOGY REPORT*  Clinical Data: Shortness of breath, chest pain.  CHEST - 2 VIEW  Comparison: July 09, 2012.  Findings: Stable cardiomegaly.  No change in position of left-sided pacemaker.  Interval development of a large right upper lobe opacity as well as smaller left perihilar opacity concerning for pneumonia or possibly edema.  IMPRESSION: Interval development of bilateral upper lobe opacities with right worse than left, concerning for pneumonia or possibly edema.   Original Report Authenticated By: Lupita Raider.,  M.D.    Ct Head Wo Contrast  09/22/2012  *RADIOLOGY REPORT*  Clinical Data: Weakness  CT HEAD WITHOUT CONTRAST  Technique:  Contiguous axial images were obtained from the base of the skull through the vertex without contrast.  Comparison: CT head 07/15/2008.  MRI 08/11/2011  Findings: Age appropriate atrophy.  Negative for acute infarct. Negative for hemorrhage or mass.  No fluid collection or shift of the midline structures.   Calvarium shows no acute abnormality. Atherosclerotic calcification in the vertebral and carotid arteries bilaterally.  IMPRESSION: No acute intracranial abnormality.   Original Report Authenticated By: Janeece Riggers, M.D.     EKG: Pending  Assessment/Plan  Acute respiratory failure due to Healthcare-associated pneumonia: - Shortness of breath with cough and a chest x-ray that shows bilateral upper lobes infiltrates. His white count is borderline high to this most likely healthcare associated pneumonia as he was in the hospital less than 90 days ago. Will go ahead and start him on vancomycin cefepime and Levaquin. We'll get blood cultures and sputum cultures. He's not using accessory muscles to breathe, he is moving moderate air.  -He is moving moderate air with wheezing bilaterally. He used to smoke about 20 years ago. I will go ahead and start him on Solu-Medrol. Also start him on albuterol inhalers. I would admit him to telemetry continue to monitor his oxygen saturation.  - Check a lactic acid, PT and INR check here in the hospital showed a PT to be 16, which is slightly this most likely secondary to [name]. He has no other multiorgan failure so think is reasonable to admit him to telemetry and continue to monitor.  - Also check strict I.'s and O.'s and daily weights.  CKD (chronic kidney disease) stage 3, GFR 30-59 ml/min - Creatinine is close to baseline. His white count is 1.0. - We'll go ahead and hold his ARB, was his blood pressures start  trending up we can restarted.  Pacemaker-St.Jude, implanted June 2013 - Admit him to telemetry. - On Chronic anticoagulation, [name]  H/O colostomy - Stable no signs infection.  Hypertension: - I will go ahead and hold his ARB and calcium channel blocker., he is on 2 beta blockers I will hold atenolol continue the metoprolol, we'll go ahead and also continue the amiodarone. There is no EKG present at this time, so we'll order one.  Code  Status: full Family Communication: none Disposition Plan: home 3-4 days  Time spent: 60 minutes  Marinda Elk [facility] Pager [phone]  If 7PM-7AM, please contact night-coverage [email or web address] Password [facility] 09/22/2012, 5:23 PM

## 2012-09-23 ENCOUNTER — Inpatient Hospital Stay (HOSPITAL_COMMUNITY): Payer: Medicare Other

## 2012-09-23 DIAGNOSIS — I4891 Unspecified atrial fibrillation: Secondary | ICD-10-CM

## 2012-09-23 DIAGNOSIS — R0602 Shortness of breath: Secondary | ICD-10-CM

## 2012-09-23 LAB — FERRITIN: Ferritin: 188 ng/mL (ref 22–322)

## 2012-09-23 LAB — HEMOGLOBIN A1C: Hgb A1c MFr Bld: 6.7 % — ABNORMAL HIGH (ref <5.7)

## 2012-09-23 LAB — BLOOD GAS, ARTERIAL
Acid-base deficit: 2.6 mmol/L — ABNORMAL HIGH (ref 0.0–2.0)
Bicarbonate: 21.2 mEq/L (ref 20.0–24.0)
O2 Content: 5 L/min
O2 Saturation: 83.6 %
Patient temperature: 98.6

## 2012-09-23 LAB — COMPREHENSIVE METABOLIC PANEL
Albumin: 2.4 g/dL — ABNORMAL LOW (ref 3.5–5.2)
BUN: 21 mg/dL (ref 6–23)
Creatinine, Ser: 1.1 mg/dL (ref 0.50–1.35)
Total Protein: 6.1 g/dL (ref 6.0–8.3)

## 2012-09-23 LAB — TROPONIN I: Troponin I: <0.3 ng/mL (ref <0.30)

## 2012-09-23 LAB — HIV ANTIBODY (ROUTINE TESTING W REFLEX): HIV: NONREACTIVE

## 2012-09-23 LAB — CBC
HCT: 27.3 % — ABNORMAL LOW (ref 39.0–52.0)
MCV: 90.4 fL (ref 78.0–100.0)
Platelets: 209 10*3/uL (ref 150–400)
RBC: 3.02 MIL/uL — ABNORMAL LOW (ref 4.22–5.81)
WBC: 8.7 10*3/uL (ref 4.0–10.5)

## 2012-09-23 LAB — IRON AND TIBC
Iron: 22 ug/dL — ABNORMAL LOW (ref 42–135)
Saturation Ratios: 10 % — ABNORMAL LOW (ref 20–55)
TIBC: 230 ug/dL (ref 215–435)
UIBC: 208 ug/dL (ref 125–400)

## 2012-09-23 LAB — LEGIONELLA ANTIGEN, URINE: Legionella Antigen, Urine: NEGATIVE

## 2012-09-23 LAB — GLUCOSE, CAPILLARY: Glucose-Capillary: 214 mg/dL — ABNORMAL HIGH (ref 70–99)

## 2012-09-23 LAB — RETICULOCYTES: Retic Ct Pct: 2.7 % (ref 0.4–3.1)

## 2012-09-23 MED ORDER — FUROSEMIDE 10 MG/ML IJ SOLN
40.0000 mg | Freq: Once | INTRAMUSCULAR | Status: AC
  Administered 2012-09-23: 40 mg via INTRAVENOUS
  Filled 2012-09-23: qty 4

## 2012-09-23 MED ORDER — FUROSEMIDE 10 MG/ML IJ SOLN
40.0000 mg | Freq: Once | INTRAMUSCULAR | Status: AC
  Administered 2012-09-23: 40 mg via INTRAVENOUS

## 2012-09-23 MED ORDER — INSULIN ASPART 100 UNIT/ML ~~LOC~~ SOLN
0.0000 [IU] | Freq: Every day | SUBCUTANEOUS | Status: DC
  Administered 2012-09-25: 2 [IU] via SUBCUTANEOUS

## 2012-09-23 MED ORDER — FUROSEMIDE 10 MG/ML IJ SOLN
INTRAMUSCULAR | Status: AC
  Administered 2012-09-23: 40 mg via INTRAVENOUS
  Filled 2012-09-23: qty 4

## 2012-09-23 MED ORDER — FUROSEMIDE 10 MG/ML IJ SOLN
INTRAMUSCULAR | Status: AC
  Filled 2012-09-23: qty 4

## 2012-09-23 MED ORDER — INSULIN ASPART 100 UNIT/ML ~~LOC~~ SOLN
0.0000 [IU] | Freq: Three times a day (TID) | SUBCUTANEOUS | Status: DC
  Administered 2012-09-23: 2 [IU] via SUBCUTANEOUS
  Administered 2012-09-23: 5 [IU] via SUBCUTANEOUS
  Administered 2012-09-24: 2 [IU] via SUBCUTANEOUS
  Administered 2012-09-24: 5 [IU] via SUBCUTANEOUS
  Administered 2012-09-24: 3 [IU] via SUBCUTANEOUS
  Administered 2012-09-25: 2 [IU] via SUBCUTANEOUS
  Administered 2012-09-25: 3 [IU] via SUBCUTANEOUS
  Administered 2012-09-25: 5 [IU] via SUBCUTANEOUS
  Administered 2012-09-26: 8 [IU] via SUBCUTANEOUS
  Administered 2012-09-26: 2 [IU] via SUBCUTANEOUS
  Administered 2012-09-26 – 2012-09-27 (×2): 3 [IU] via SUBCUTANEOUS
  Administered 2012-09-27: 5 [IU] via SUBCUTANEOUS
  Administered 2012-09-27 – 2012-09-28 (×2): 3 [IU] via SUBCUTANEOUS
  Administered 2012-09-28 – 2012-09-29 (×4): 2 [IU] via SUBCUTANEOUS
  Administered 2012-09-29: 3 [IU] via SUBCUTANEOUS

## 2012-09-23 NOTE — Progress Notes (Addendum)
Call to room by nursiing student, client is extremely SOB with moist rales.  IV fluids stop[ped.  O2 saturation 81% on 5L nasal cannula.  Pulled up in bed and am meds given since these included BP meds.  Spoke with physician.  Rapid Response Nurse called.  ABG's obtained.  Respirations 32.  O2 delivery system changed to  Venturi mask, 50%.  O2 sat is now 89-90.  Alert and oriented.  Bilateral hearing aids in place.  Blood gas results shown to physician.    ABG: ph 7.42, CO2- 33, O2-50.8, Satuartion 83, Bicarb 21, BE -2.6.  Head of bed elevated at 45%.  Less tachypnic at this time.  Vital signs 151/80-118-32.  O2 mask changed to venturi and has brought saturation up to 94-95%.  Rapid Response Nurse at bedside.  1141 Placed on BIPAP.  1232 client moved to 2616 accompanied by Rapid Response Nurse and respiratory therapy.  Remains on BIPAP.  Respirations have slowed down.

## 2012-09-23 NOTE — Progress Notes (Signed)
Patient is doing well on 50% venturi mask sat is 97%. Patient does have mild wheezes and fine crackles. RT will continue to monitor.

## 2012-09-23 NOTE — Progress Notes (Signed)
Called to place pt on BIPAP for Respiratory distress. Pt placed on Bipap 12/6 60% Sp02 99%, rr 25, HR 99, BS Crackles & some Rhonchi. Pt has transferr orders to 2600. RT will continue to monitor pt progress.

## 2012-09-23 NOTE — Progress Notes (Signed)
[facility] PROGRESS NOTE  [name] [name] WUJ:[id number] DOB: 1921/12/10 DOA: 09/22/2012 PCP: Carollee Herter, MD  Assessment/Plan:  Acute hypoxic respiratory failure due to Healthcare-associated pneumonia/copd exacerbation/ Fluid overload:  - Shortness of breath with cough and a chest x-ray that shows bilateral upper lobes infiltrates. His white count is borderline high to this most likely healthcare associated pneumonia as he was in the hospital less than 90 days ago.  He was started on vancomycin zosyn and Levaquin.  We'll get blood cultures and sputum cultures. Started him on Solu-Medrol and nebulizations. -  Lactic acid is normal,  - Also check strict I.'s and O.'s and daily weights. - ordered 2 D echocardiogram and probnp serial enzymes.  - repeat cxr in am.   - started lasix IV BID, ABG ordered.  - will start BIPAP and transfer patient to step down.   CKD (chronic kidney disease) stage 3, GFR 30-59 ml/min  - Creatinine is close to baseline.   - We'll go ahead and hold his ARB, was his blood pressures start trending up we can restarted.   Pacemaker-St.Jude, implanted June 2013 / Atrial Fibrillation:  - On Chronic anticoagulation, [name].and amiodarone. Rate is suboptimal today.   H/O colostomy  - Stable no signs infection.   Hypertension:  - I will go ahead and hold his ARB and calcium channel blocker., he is on 2 beta blockers I will hold atenolol continue the metoprolol, we'll go ahead and also continue the amiodarone.  Anemia: normocytic. Anemia panel ordered.  ? Diabetes Mellitus: SSI. HGba1c is pending.        Code Status: full code Family Communication: none at bedside Disposition Plan: possibly in 2 to 3days.      Antibiotics:  Vancomycin 2/7  Zosyn 2/7  levaquin 2/7  HPI/Subjective: Denies any pain, tachypneic.  Objective: Filed Vitals:   09/22/12 2155 09/22/12 2337 09/22/12 2339 09/23/12 0504  BP: 143/73   145/94  Pulse: 104    74  Temp:    97 F (36.1 C)  TempSrc:    Oral  Resp:    22  Height:      Weight:    86.728 kg (191 lb 3.2 oz)  SpO2:  90% 93% 95%    Intake/Output Summary (Last 24 hours) at 09/23/12 0925 Last data filed at 09/23/12 0400  Gross per 24 hour  Intake 2531.67 ml  Output    325 ml  Net 2206.67 ml   Filed Weights   09/22/12 2023 09/23/12 0504  Weight: 85.276 kg (188 lb) 86.728 kg (191 lb 3.2 oz)    Exam:   General:  Alert afebrile in mild respiratory distress. He is 5 lit [place] oxygen  Cardiovascular: s1s2 RRR.  Respiratory: FAIR air entry bilateral, bilateral rhonchi and crackles.  Abdomen: sof t NT ND BS+. Colostomy bag .  Data Reviewed: Basic Metabolic Panel:  Recent Labs Lab 09/22/12 1506 09/23/12 0604  NA 140 144  K 3.5 3.9  CL 104 109  CO2 23 26  GLUCOSE 161* 223*  BUN 22 21  CREATININE 1.20 1.10  CALCIUM 9.2 8.9   Liver Function Tests:  Recent Labs Lab 09/22/12 1506 09/23/12 0604  AST 47* 49*  ALT 41 49  ALKPHOS 81 82  BILITOT 0.6 0.5  PROT 6.6 6.1  ALBUMIN 2.7* 2.4*   No results found for this basename: LIPASE, AMYLASE,  in the last 168 hours No results found for this basename: AMMONIA,  in the last 168 hours CBC:  Recent  Labs Lab 09/22/12 1506 09/23/12 0604  WBC 10.1 8.7  NEUTROABS 8.3*  --   HGB 9.8* 9.3*  HCT 28.3* 27.3*  MCV 90.1 90.4  PLT 224 209   Cardiac Enzymes:  Recent Labs Lab 09/22/12 1517  TROPONINI <0.30   BNP (last 3 results)  Recent Labs  02/06/12 1416  PROBNP 3850.0*   CBG: No results found for this basename: GLUCAP,  in the last 168 hours  Recent Results (from the past 240 hour(s))  MRSA PCR SCREENING     Status: None   Collection Time    09/22/12  8:35 PM      Result Value Range Status   MRSA by PCR NEGATIVE  NEGATIVE Final   Comment:            The GeneXpert MRSA Assay (FDA     approved for NASAL specimens     only), is one component of a     comprehensive MRSA colonization     surveillance  program. It is not     intended to diagnose MRSA     infection nor to guide or     monitor treatment for     MRSA infections.     Studies: Dg Chest 2 View  09/22/2012  *RADIOLOGY REPORT*  Clinical Data: Shortness of breath, chest pain.  CHEST - 2 VIEW  Comparison: July 09, 2012.  Findings: Stable cardiomegaly.  No change in position of left-sided pacemaker.  Interval development of a large right upper lobe opacity as well as smaller left perihilar opacity concerning for pneumonia or possibly edema.  IMPRESSION: Interval development of bilateral upper lobe opacities with right worse than left, concerning for pneumonia or possibly edema.   Original Report Authenticated By: Lupita Raider.,  M.D.    Ct Head Wo Contrast  09/22/2012  *RADIOLOGY REPORT*  Clinical Data: Weakness  CT HEAD WITHOUT CONTRAST  Technique:  Contiguous axial images were obtained from the base of the skull through the vertex without contrast.  Comparison: CT head 07/15/2008.  MRI 08/11/2011  Findings: Age appropriate atrophy.  Negative for acute infarct. Negative for hemorrhage or mass.  No fluid collection or shift of the midline structures.  Calvarium shows no acute abnormality. Atherosclerotic calcification in the vertebral and carotid arteries bilaterally.  IMPRESSION: No acute intracranial abnormality.   Original Report Authenticated By: Janeece Riggers, M.D.     Scheduled Meds: . [COMPLETED] sodium chloride   Intravenous STAT  . amiodarone  200 mg Oral Daily  . antiseptic oral rinse  15 mL Mouth Rinse BID  . apixaban  2.5 mg Oral BID  . ceFEPime (MAXIPIME) IV  1 g Intravenous Q24H  . escitalopram  20 mg Oral Daily  . [START ON 09/24/2012] levofloxacin (LEVAQUIN) IV  750 mg Intravenous Q48H  . methylPREDNISolone (SOLU-MEDROL) injection  60 mg Intravenous Q12H  . metoprolol tartrate  25 mg Oral BID  . pantoprazole  40 mg Oral Q1200  . sodium chloride  3 mL Intravenous Q12H  . Tamsulosin HCl  0.4 mg Oral QPC supper  .  vancomycin  750 mg Intravenous Q12H   Continuous Infusions:   Principal Problem:   Respiratory failure, acute Active Problems:   HYPERLIPIDEMIA   Hypertension   H/O colostomy   CKD (chronic kidney disease) stage 3, GFR 30-59 ml/min   Pacemaker-St.Jude, implanted June 2013   Chronic anticoagulation, [name]   Healthcare-associated pneumonia    Time spent: 31 min    Jagdeep Ancheta  [facility]  Pager [phone]. If 8PM-8AM, please contact night-coverage at [email or web address], password [facility] 09/23/2012, 9:25 AM  LOS: 1 day

## 2012-09-23 NOTE — Progress Notes (Signed)
Pt report received from [place], [place] on 4700.  Pt arrived to floor on BIPAP, tele.  Pt appears to be in no acute distress, denies pain and discomfort.  Will continue to monitor.  Salomon Mast, RN

## 2012-09-23 NOTE — Progress Notes (Signed)
Pt pulled off BIPAP mask and refuses to put it back on d/t claustrophobia; placed on venturi mask 50% sating 96%, resps 30, resting; BP 146/81; HR 104 a-fib.  Hilliard Clark, MD with update. No new orders were received, will continue to monitor and attempt to titrate down to nasal cannula.  Salomon Mast, RN

## 2012-09-23 NOTE — Progress Notes (Signed)
This note also relates to the following rows which could not be included: Pulse Rate - Cannot attach notes to unvalidated device data Resp - Cannot attach notes to unvalidated device data BP - Cannot attach notes to unvalidated device data SpO2 - Cannot attach notes to unvalidated device data   Pt off Bipap and on [place] at 6L  Sats are 96.

## 2012-09-23 NOTE — Significant Event (Signed)
Rapid Response Event Note  Overview: Called to assist with patient with resp distress and decreased O2 sats Time Called: 1019 Arrival Time: 1026 Event Type: Respiratory  Initial Focused Assessment:  On arrival patient sitting in bed.  Alert w/d pink answering questions full sentence and appropriate.  States he is slightly SOB.  RR 36 - slight use accessory muscles.  Being changed to VM  Per RN - RT obtaining ABG on n/c.  Bil BS present with crackles in all fields left and right - some exp wheezing noted.  Some slight pedal edema.  No JVD noted.  Abd soft - non-tender. BP 166/87  HR 110 - afib on monitor - RN states patient was vpacing a few minutes prior.  Denies CP.     Interventions:  Repositioned in bed.  O2 sats of VM - 89-92%.  Patient states he feels little better.  Lasix 40 mg IV given PTA - condom cath placed - moderate amount clear urine in bag. ABG noted - Dr. Blake Divine updated - order for BiPap - RT called to bring machine and initiate - placed on NRB mask in meantime to saturate - O2 sats up to 98%.  RR 28.  States he feels better.  Urine increasing.  BP 156/84 HR 103 O2 sats 98%  RR 28.  PCXR shows increasing edema - Dr. Blake Divine updated.  Placed on BiPap per RT - see RT note.  Patient tolerated well.  BP 145/93  HR 98 - afib - RR 24 O2 sats 98%.  Transferred to 2616 without incident.  Patient remains stable.  Handoff report to [employer].     Event Summary: Name of Physician Notified: Dr. Blake Divine at  (prior to arrival - MD present on unit)    at    Outcome: Transferred (Comment) (2616)  Event End Time: 1230  [place]

## 2012-09-24 ENCOUNTER — Inpatient Hospital Stay (HOSPITAL_COMMUNITY): Payer: Medicare Other

## 2012-09-24 ENCOUNTER — Encounter (HOSPITAL_COMMUNITY): Payer: Self-pay | Admitting: Cardiology

## 2012-09-24 DIAGNOSIS — J9600 Acute respiratory failure, unspecified whether with hypoxia or hypercapnia: Secondary | ICD-10-CM

## 2012-09-24 DIAGNOSIS — I5031 Acute diastolic (congestive) heart failure: Secondary | ICD-10-CM | POA: Diagnosis present

## 2012-09-24 LAB — BASIC METABOLIC PANEL WITH GFR
BUN: 30 mg/dL — ABNORMAL HIGH (ref 6–23)
CO2: 28 meq/L (ref 19–32)
Calcium: 9.2 mg/dL (ref 8.4–10.5)
Chloride: 99 meq/L (ref 96–112)
Creatinine, Ser: 1.17 mg/dL (ref 0.50–1.35)
GFR calc Af Amer: 62 mL/min — ABNORMAL LOW
GFR calc non Af Amer: 54 mL/min — ABNORMAL LOW
Glucose, Bld: 189 mg/dL — ABNORMAL HIGH (ref 70–99)
Potassium: 3.4 meq/L — ABNORMAL LOW (ref 3.5–5.1)
Sodium: 143 meq/L (ref 135–145)

## 2012-09-24 LAB — TROPONIN I: Troponin I: <0.3 ng/mL (ref <0.30)

## 2012-09-24 LAB — GLUCOSE, CAPILLARY
Glucose-Capillary: 193 mg/dL — ABNORMAL HIGH (ref 70–99)
Glucose-Capillary: 209 mg/dL — ABNORMAL HIGH (ref 70–99)
Glucose-Capillary: 134 mg/dL — ABNORMAL HIGH (ref 70–99)

## 2012-09-24 LAB — TSH: TSH: 1.999 u[IU]/mL (ref 0.350–4.500)

## 2012-09-24 MED ORDER — PREDNISONE 50 MG PO TABS
60.0000 mg | ORAL_TABLET | Freq: Every day | ORAL | Status: AC
  Administered 2012-09-25 – 2012-09-27 (×3): 60 mg via ORAL
  Filled 2012-09-24 (×4): qty 1

## 2012-09-24 MED ORDER — METOPROLOL TARTRATE 25 MG PO TABS
25.0000 mg | ORAL_TABLET | Freq: Three times a day (TID) | ORAL | Status: DC
  Administered 2012-09-24 – 2012-09-29 (×16): 25 mg via ORAL
  Filled 2012-09-24 (×18): qty 1

## 2012-09-24 MED ORDER — DIGOXIN 0.25 MG/ML IJ SOLN
0.5000 mg | Freq: Once | INTRAMUSCULAR | Status: AC
  Administered 2012-09-24: 0.5 mg via INTRAVENOUS
  Filled 2012-09-24: qty 2

## 2012-09-24 MED ORDER — AMIODARONE HCL 200 MG PO TABS
200.0000 mg | ORAL_TABLET | Freq: Once | ORAL | Status: AC
  Administered 2012-09-24: 200 mg via ORAL
  Filled 2012-09-24: qty 1

## 2012-09-24 MED ORDER — POTASSIUM CHLORIDE CRYS ER 20 MEQ PO TBCR
40.0000 meq | EXTENDED_RELEASE_TABLET | Freq: Every day | ORAL | Status: DC
  Administered 2012-09-24 – 2012-09-27 (×4): 40 meq via ORAL
  Filled 2012-09-24 (×6): qty 2

## 2012-09-24 MED ORDER — DIGOXIN 250 MCG PO TABS
0.2500 mg | ORAL_TABLET | Freq: Every day | ORAL | Status: DC
  Administered 2012-09-25 – 2012-09-27 (×3): 0.25 mg via ORAL
  Filled 2012-09-24 (×3): qty 1

## 2012-09-24 MED ORDER — IRBESARTAN 150 MG PO TABS: 150.0000 mg | ORAL_TABLET | Freq: Every day | ORAL | Status: DC

## 2012-09-24 MED ORDER — AMIODARONE HCL 200 MG PO TABS
400.0000 mg | ORAL_TABLET | Freq: Two times a day (BID) | ORAL | Status: DC
  Administered 2012-09-24 – 2012-09-29 (×10): 400 mg via ORAL
  Filled 2012-09-24 (×11): qty 2

## 2012-09-24 MED ORDER — FUROSEMIDE 10 MG/ML IJ SOLN
40.0000 mg | Freq: Two times a day (BID) | INTRAMUSCULAR | Status: DC
  Administered 2012-09-24 – 2012-09-26 (×5): 40 mg via INTRAVENOUS
  Filled 2012-09-24 (×7): qty 4

## 2012-09-24 NOTE — Progress Notes (Signed)
[facility] PROGRESS NOTE  [name] [name] ZOX:[id number] DOB: December 02, 1921 DOA: 09/22/2012 PCP: Carollee Herter, MD  Assessment/Plan:  Acute hypoxic respiratory failure due to Healthcare-associated pneumonia/copd exacerbation/ Fluid overload:   - Shortness of breath with cough and a chest x-ray that shows bilateral upper lobes infiltrates. His white count is borderline high to this most likely healthcare associated pneumonia as he was in the hospital less than 90 days ago.  He was started on vancomycin zosyn and Levaquin, blood cultures and sputum cultures pending.  Started him on Solu-Medrol on admission, he is currently not wheezing, we will do a quick taper in the next 3 days.  -  Lactic acid is normal, he was started on fluids on admission and on 2/8 patient was found to be in acute distress with bilateral rhonchi and basilar rales, requiring 6 liters of nasal oxygen with oxygen sats in upper 80's. He was given a doseof IV lasix and an ABG done, . He was hypoxemic on 5 lit [place] oxygen. BIpap was ordered and he was transferred to step down. He was started on IV bid Lasix - ordered 2 D echocardiogram  serial enzymes negative. Also check strict I.'s and O.'s and daily weights. - repeat cxr in am shows improvement.     CKD (chronic kidney disease) stage 3, GFR 30-59 ml/min  - Creatinine is close to baseline.   - We'll go ahead and hold his ARB, was his blood pressures start trending up we can restarted.   Pacemaker-St.Jude, implanted June 2013 / Atrial Fibrillation:  - On Chronic anticoagulation, [name].and amiodarone. Rate is suboptimal today between 100 to 110.   H/O colostomy  - Stable no signs infection.   Hypertension:  - I will go ahead and hold his ARB and calcium channel blocker., he is on 2 beta blockers I will hold atenolol continue the metoprolol, we'll go ahead and also continue the amiodarone.  Anemia: normocytic. Anemia panel ordered.  ? Diabetes Mellitus: SSI.  HGba1c is 6.7 CBG (last 3)   Recent Labs  09/23/12 1303 09/23/12 1652 09/23/12 2141  GLUCAP 214* 140* 181*      Code Status: full code Family Communication: none at bedside Disposition Plan: possibly in 2 to 3days.      Antibiotics:  Vancomycin 2/7  Zosyn 2/7  levaquin 2/7  HPI/Subjective: Denies any pain, tachypneic.  Objective: Filed Vitals:   09/24/12 0034 09/24/12 0500 09/24/12 0740 09/24/12 0832  BP: 141/84 140/75 141/98   Pulse: 100 112 139 67  Temp: 97.7 F (36.5 C) 97.6 F (36.4 C) 97.4 F (36.3 C)   TempSrc: Oral  Oral   Resp: 28 32 21 25  Height:      Weight:  84 kg (185 lb 3 oz)    SpO2: 96% 93% 95% 95%    Intake/Output Summary (Last 24 hours) at 09/24/12 0907 Last data filed at 09/24/12 0800  Gross per 24 hour  Intake    373 ml  Output   4050 ml  Net  -3677 ml   Filed Weights   09/22/12 2023 09/23/12 0504 09/24/12 0500  Weight: 85.276 kg (188 lb) 86.728 kg (191 lb 3.2 oz) 84 kg (185 lb 3 oz)    Exam:   General:  Alert afebrile in mild respiratory distress. He is 5 lit [place] oxygen  Cardiovascular: s1s2 RRR.  Respiratory: FAIR air entry bilateral, scattered rhonchi.   Abdomen: sof t NT ND BS+. Colostomy bag .  Data Reviewed: Basic Metabolic Panel:  Recent Labs Lab 09/22/12 1506 09/23/12 0604 09/24/12 0530  NA 140 144 143  K 3.5 3.9 3.4*  CL 104 109 99  CO2 23 26 28   GLUCOSE 161* 223* 189*  BUN 22 21 30*  CREATININE 1.20 1.10 1.17  CALCIUM 9.2 8.9 9.2   Liver Function Tests:  Recent Labs Lab 09/22/12 1506 09/23/12 0604  AST 47* 49*  ALT 41 49  ALKPHOS 81 82  BILITOT 0.6 0.5  PROT 6.6 6.1  ALBUMIN 2.7* 2.4*   No results found for this basename: LIPASE, AMYLASE,  in the last 168 hours No results found for this basename: AMMONIA,  in the last 168 hours CBC:  Recent Labs Lab 09/22/12 1506 09/23/12 0604  WBC 10.1 8.7  NEUTROABS 8.3*  --   HGB 9.8* 9.3*  HCT 28.3* 27.3*  MCV 90.1 90.4  PLT 224 209    Cardiac Enzymes:  Recent Labs Lab 09/22/12 1517 09/23/12 1104 09/23/12 1640 09/23/12 2342  TROPONINI <0.30 <0.30 <0.30 <0.30   BNP (last 3 results)  Recent Labs  02/06/12 1416 09/23/12 1104  PROBNP 3850.0* 4943.0*   CBG:  Recent Labs Lab 09/23/12 1303 09/23/12 1652 09/23/12 2141  GLUCAP 214* 140* 181*    Recent Results (from the past 240 hour(s))  CULTURE, BLOOD (ROUTINE X 2)     Status: None   Collection Time    09/22/12  5:10 PM      Result Value Range Status   Specimen Description BLOOD ARM LEFT   Final   Special Requests BOTTLES DRAWN AEROBIC AND ANAEROBIC 10CC   Final   Culture  Setup Time 09/22/2012 22:30   Final   Culture     Final   Value:        BLOOD CULTURE RECEIVED NO GROWTH TO DATE CULTURE WILL BE HELD FOR 5 DAYS BEFORE ISSUING A FINAL NEGATIVE REPORT   Report Status PENDING   Incomplete  CULTURE, BLOOD (ROUTINE X 2)     Status: None   Collection Time    09/22/12  5:20 PM      Result Value Range Status   Specimen Description BLOOD HAND RIGHT   Final   Special Requests BOTTLES DRAWN AEROBIC ONLY 5CC   Final   Culture  Setup Time 09/22/2012 22:31   Final   Culture     Final   Value:        BLOOD CULTURE RECEIVED NO GROWTH TO DATE CULTURE WILL BE HELD FOR 5 DAYS BEFORE ISSUING A FINAL NEGATIVE REPORT   Report Status PENDING   Incomplete  CULTURE, BLOOD (ROUTINE X 2)     Status: None   Collection Time    09/22/12  7:12 PM      Result Value Range Status   Specimen Description BLOOD RIGHT HAND   Final   Special Requests BOTTLES DRAWN AEROBIC ONLY 3CC   Final   Culture  Setup Time 09/22/2012 22:31   Final   Culture     Final   Value:        BLOOD CULTURE RECEIVED NO GROWTH TO DATE CULTURE WILL BE HELD FOR 5 DAYS BEFORE ISSUING A FINAL NEGATIVE REPORT   Report Status PENDING   Incomplete  CULTURE, BLOOD (ROUTINE X 2)     Status: None   Collection Time    09/22/12  7:13 PM      Result Value Range Status   Specimen Description BLOOD LEFT ARM    Final   Special Requests  Final   Value: BOTTLES DRAWN AEROBIC AND ANAEROBIC 10CC AER,8CC ANA   Culture  Setup Time 09/22/2012 22:30   Final   Culture     Final   Value:        BLOOD CULTURE RECEIVED NO GROWTH TO DATE CULTURE WILL BE HELD FOR 5 DAYS BEFORE ISSUING A FINAL NEGATIVE REPORT   Report Status PENDING   Incomplete  MRSA PCR SCREENING     Status: None   Collection Time    09/22/12  8:35 PM      Result Value Range Status   MRSA by PCR NEGATIVE  NEGATIVE Final   Comment:            The GeneXpert MRSA Assay (FDA     approved for NASAL specimens     only), is one component of a     comprehensive MRSA colonization     surveillance program. It is not     intended to diagnose MRSA     infection nor to guide or     monitor treatment for     MRSA infections.     Studies: Dg Chest 2 View  09/22/2012  *RADIOLOGY REPORT*  Clinical Data: Shortness of breath, chest pain.  CHEST - 2 VIEW  Comparison: July 09, 2012.  Findings: Stable cardiomegaly.  No change in position of left-sided pacemaker.  Interval development of a large right upper lobe opacity as well as smaller left perihilar opacity concerning for pneumonia or possibly edema.  IMPRESSION: Interval development of bilateral upper lobe opacities with right worse than left, concerning for pneumonia or possibly edema.   Original Report Authenticated By: Lupita Raider.,  M.D.    Ct Head Wo Contrast  09/22/2012  *RADIOLOGY REPORT*  Clinical Data: Weakness  CT HEAD WITHOUT CONTRAST  Technique:  Contiguous axial images were obtained from the base of the skull through the vertex without contrast.  Comparison: CT head 07/15/2008.  MRI 08/11/2011  Findings: Age appropriate atrophy.  Negative for acute infarct. Negative for hemorrhage or mass.  No fluid collection or shift of the midline structures.  Calvarium shows no acute abnormality. Atherosclerotic calcification in the vertebral and carotid arteries bilaterally.  IMPRESSION: No acute  intracranial abnormality.   Original Report Authenticated By: Janeece Riggers, M.D.    Dg Chest Port 1 View  09/24/2012  *RADIOLOGY REPORT*  Clinical Data: Evaluate pulmonary congestion  PORTABLE CHEST - 1 VIEW  Comparison: Prior chest x-ray 09/23/2012  Findings: Similar to incrementally improved bilateral interstitial and airspace opacities with increased confluence in the right upper and mid lung.  Stable cardiomegaly with probable left atrial enlargement.  Left subclavian dual lead cardiac rhythm maintenance device in unchanged position.  No pneumothorax.  Possible trace bilateral effusions are stable.  IMPRESSION:  Similar to incrementally improved bilateral interstitial and airspace opacities which may reflect asymmetric edema versus multi focal pneumonia.   Original Report Authenticated By: Malachy Moan, M.D.    Dg Chest Port 1 View  09/23/2012  *RADIOLOGY REPORT*  Clinical Data: 79 year old male shortness of breath and congestion.  PORTABLE CHEST - 1 VIEW  Comparison: 09/22/2012 and earlier.  Findings: Semi upright AP portable view 1115 hours.  More generalized increased vague and nodular pulmonary opacity, greater in the right lung.  The same time, the right greater than left perihilar opacity yesterday is less confluent.  Stable cardiac size and mediastinal contours.  Left chest cardiac pacemaker. Visualized tracheal air column is within normal limits.  IMPRESSION: Less  confluent but more generalized bilateral pulmonary opacity. Differential considerations remain bilateral infection and asymmetric edema.   Original Report Authenticated By: Erskine Speed, M.D.     Scheduled Meds: . amiodarone  200 mg Oral Daily  . antiseptic oral rinse  15 mL Mouth Rinse BID  . apixaban  2.5 mg Oral BID  . ceFEPime (MAXIPIME) IV  1 g Intravenous Q24H  . insulin aspart  0-15 Units Subcutaneous TID WC  . insulin aspart  0-5 Units Subcutaneous QHS  . levofloxacin (LEVAQUIN) IV  750 mg Intravenous Q48H  .  methylPREDNISolone (SOLU-MEDROL) injection  60 mg Intravenous Q12H  . metoprolol tartrate  25 mg Oral BID  . sodium chloride  3 mL Intravenous Q12H  . vancomycin  750 mg Intravenous Q12H   Continuous Infusions:   Principal Problem:   Respiratory failure, acute Active Problems:   HYPERLIPIDEMIA   Hypertension   H/O colostomy   CKD (chronic kidney disease) stage 3, GFR 30-59 ml/min   Pacemaker-St.Jude, implanted June 2013   Chronic anticoagulation, [name]   Healthcare-associated pneumonia    Time spent: 28 min    Khup Sapia  [facility] Pager [phone]. If 8PM-8AM, please contact night-coverage at [email or web address], password [facility] 09/24/2012, 9:07 AM  LOS: 2 days

## 2012-09-24 NOTE — Consult Note (Addendum)
I have seen and evaluated the patient this AM along with Nada Boozer, NP. I agree with her findings, examination as well as impression recommendations.  79 y/o man well known to our service -- PAD, SSS with previous documentation of normal EF & negative myoview.  Was doing relatively well, planned DCCV for Afib on 1/4 cancelled as he was back in NSR -- I suspect that his paroxysms of Afib are relatively frequent.  He denies and Sx of HF such as PND/Orhtopnea or edema, but does note DOE that can be explained by Afib.  I would suspect that he has underlying diastolic dysfunction (was scheduled to have Echo in Nov 2013, not done.  Would be nice to have one in NSR in order to allow for Diastolic Function evaluation).   Admitted this time with cough & SOB - likely related to PNA.  Initially ~4L + & developed acute onset Hypoxic respiratory failure & transferred to SDU, IV Lasix & BiPAP used.  Now on [place] O2.  Afib rate is 80s-110s now.  I suspect with loss of atrial kick & increased volume, that his acute exacerbation of dyspnea is partly related to acute diastolic HF complicated by Afib.  General appearance: alert, cooperative, appears stated age and no distress Lungs: diminished breath sounds bilaterally and rales bibasilar Heart: irregularly irregular rhythm, S1, S2 normal and systolic murmur: systolic ejection 2/6, crescendo and decrescendo at 2nd right intercostal space  Principal Problem:   Respiratory failure, acute Active Problems:   CKD (chronic kidney disease) stage 3, GFR 30-59 ml/min   HYPERLIPIDEMIA   H/O colostomy   Hypertension   PAF, was in SR 09/19/12 now A fib with RVR   Pacemaker-St.Jude, implanted June 2013   Chronic anticoagulation, [name]   Healthcare-associated pneumonia   Acute diastolic heart failure - due to PNA & Afib RVR   Increase rate control with BB to 25 tid -- has BP for it.  May need to restart ARB for afterload reduction.  Short course of PO Digoxin as well.  Has  PPM, so bradycardia is not of concern at this point.  Will increase Amiodarone Load to 400 mg po bid for several days;  Will need PFTs with DLCO done once PNA clears   Continue apixaban anticoagulation -- if Sx do not improve, could consider DCCV, although may be a fruitless venture with high likelihood of recurrence until Amiodarone fully loaded   Has had good response to diuretic, can convert to PO Lasix in AM.  Will look for Echo. Has known Aortic Sclerosis that explains his SEM on exam.  Will follow along.  Marykay Lex, M.D., M.S. [facility] [address] [place], [place]  [address]  [phone] Pager # [phone] 09/24/2012 11:29 AM

## 2012-09-24 NOTE — Progress Notes (Signed)
  Echocardiogram 2D Echocardiogram has been performed.  Jatin Naumann FRANCES 09/24/2012, 5:14 PM

## 2012-09-24 NOTE — Consult Note (Signed)
Reason for Consult:CHF and back in atrial fib   Referring Physician: Dr.  Blake Divine    PCP: Dr. Susann Givens  Cardiologist: Dr. Ivonne Andrew is an 79 y.o. male.    Chief Complaint:  Pt admitted 09/22/12 with acute respiratory failure due to pneumonia.  Now has developed CHF.  HPI: 79 yo cam to the ER because of shortness of breath and cough that started 2 weeks prior to admission. He related he started feeling short of breath and then developed a cough which had progressively gotten worse to the point where he couldn't even get out of bed without being short of breath.  He was extremely fatigued.  He related no fevers. No diarrhea, chest pain. He related no night sweats. Day of admit when he got up he was so tired he could not move from his bed so his wife called EMS and was brought here to the hospital. He had been using his wife's oxygen at home. CXR possible PNA vs. Edema.  Treated with Vancomycin.   During the day of the 8th developed increasing respiratory distress/fialure with resp in the 30's and decreasing sats.   BiPap applied but pt was intolerant, he did improve with diuresis and venturi mask.  Currently still SOB with talking and HR 114.  No chest pain.  Negative 3882 since yesterday.  Troponin I's negative.  Pro BNP yesterday 4943.  H/H 9.8/28.3.  EKG without acute ST changes.  Pt has history of PAF, on amiodarone.  The first of Jan. He was in SR with PACs but by 09/14/12 he was in a. Fib.  Arrangements were made for DCCV (pt on chronic anticoagulation with eliquis) but on arrival to Short Stay for cardioversion on Feb. 4, 2014 he was in SR.  Just prior to this pt was placed on Amiodarone for 200 mg BID for 1 week the 200 mg daily.   On arrival this admit 92/02/2013) he was in A fib with RVR.  Unsure the cause leading his current problem- PNA leading to PAF vs. PAF/CHF the leading problem.   Nuc study in 2008 low risk.  Echo in 2010 with good LV function and mod. aortic sclerosis.   Other cardiac hx. In 2013 found to have PAF and 2:1 AV block.  Rec'd [employer]. Jude pacemaker.  At that time he was converted to SR prior to discharge.  Since that time he had episode of A. Fib in Nov. 2013.  He is on Amiodarone at 200 mg daily.  On follow up office visits pacemaker interrogation have revealed PAF.  Pt is symptomatic with the PAF.     Past Medical History  Diagnosis Date  . Depression   . Aneurysm of abdominal aorta     [facility], Dr. Royann Shivers  . History of colon cancer     hospitalization 1999  . Degenerative disc disease, lumbar   . History of non-Hodgkin's lymphoma     hospitalization 2002  . Cataract   . Hard of hearing     wears hearing aids  . Hypertension   . Hyperlipidemia   . Thrombocytopenia     Dr. Darnelle Catalan  . Chronic renal disease   . Memory loss   . Hyperglycemia   . Edema   . Heart murmur     Echocardiogram 7/10, stress test 2008, [facility]  . Overactive bladder   . Anemia     Dr. Darnelle Catalan  . AV block, 2nd degree 02/06/2012  . Lymphoma, history of  02/06/2012  . H/O colostomy 02/06/2012  . History of AAA (abdominal aortic aneurysm) repair 02/06/2012  . CKD (chronic kidney disease) stage 3, GFR 30-59 ml/min 02/06/2012  . Atrial fibrillation 01/2012    cardioversion to NSR  . Pacemaker 02/08/2012    [employer] RF dual-chamber pacemaker. Dr. Hillis Range  . PAF, was in SR 09/19/12 now A fib 02/08/2012    Past Surgical History  Procedure Laterality Date  . Colostomy    . Cardioversion  02/09/2012    Procedure: CARDIOVERSION;  Surgeon: Chrystie Nose, MD;  Location: [facility] OR;  Service: Cardiovascular;  Laterality: N/A;  . Pacemaker placement  02/08/2012    Dr. Hillis Range ([facility])  . Cataract extraction, bilateral      Family History  Problem Relation Age of Onset  . Other Mother   . Other Father    Social History:  reports that he quit smoking about 45 years ago. His smoking use included Cigarettes. He smoked 2.00 packs per day. He has never used  smokeless tobacco. He reports that he does not drink alcohol or use illicit drugs.Married.  Allergies: No Known Allergies  Medications Prior to Admission  Medication Sig Dispense Refill  . amiodarone (PACERONE) 200 MG tablet Take 200 mg by mouth daily.      Marland Kitchen amLODipine-valsartan (EXFORGE) 5-160 MG per tablet Take 1 tablet by mouth every evening.  30 tablet  5  . atenolol (TENORMIN) 25 MG tablet Take 25 mg by mouth daily.      Marland Kitchen escitalopram (LEXAPRO) 20 MG tablet Take 1 tablet (20 mg total) by mouth daily.  90 tablet  0  . metoprolol tartrate (LOPRESSOR) 25 MG tablet Take 1 tablet (25 mg total) by mouth 2 (two) times daily.  60 tablet  5  . niacin (NIASPAN) 500 MG CR tablet Take 1 tablet (500 mg total) by mouth at bedtime.  30 tablet  11  . pantoprazole (PROTONIX) 40 MG tablet Take 1 tablet (40 mg total) by mouth daily at 12 noon.  30 tablet  5  . silodosin (RAPAFLO) 8 MG CAPS capsule Take 1 capsule (8 mg total) by mouth every morning.  90 capsule  2  . apixaban (ELIQUIS) 5 MG TABS tablet Take 0.5 tablets (2.5 mg total) by mouth 2 (two) times daily.  90 tablet  0    Results for orders placed during the hospital encounter of 09/22/12 (from the past 48 hour(s))  CBC WITH DIFFERENTIAL     Status: Abnormal   Collection Time    09/22/12  3:06 PM      Result Value Range   WBC 10.1  4.0 - 10.5 K/uL   RBC 3.14 (*) 4.22 - 5.81 MIL/uL   Hemoglobin 9.8 (*) 13.0 - 17.0 g/dL   HCT 16.1 (*) 09.6 - 04.5 %   MCV 90.1  78.0 - 100.0 fL   MCH 31.2  26.0 - 34.0 pg   MCHC 34.6  30.0 - 36.0 g/dL   RDW 40.9  81.1 - 91.4 %   Platelets 224  150 - 400 K/uL   Neutrophils Relative 82 (*) 43 - 77 %   Neutro Abs 8.3 (*) 1.7 - 7.7 K/uL   Lymphocytes Relative 6 (*) 12 - 46 %   Lymphs Abs 0.6 (*) 0.7 - 4.0 K/uL   Monocytes Relative 10  3 - 12 %   Monocytes Absolute 1.1 (*) 0.1 - 1.0 K/uL   Eosinophils Relative 2  0 -  5 %   Eosinophils Absolute 0.2  0.0 - 0.7 K/uL   Basophils Relative 0  0 - 1 %   Basophils  Absolute 0.0  0.0 - 0.1 K/uL  COMPREHENSIVE METABOLIC PANEL     Status: Abnormal   Collection Time    09/22/12  3:06 PM      Result Value Range   Sodium 140  135 - 145 mEq/L   Potassium 3.5  3.5 - 5.1 mEq/L   Chloride 104  96 - 112 mEq/L   CO2 23  19 - 32 mEq/L   Glucose, Bld 161 (*) 70 - 99 mg/dL   BUN 22  6 - 23 mg/dL   Creatinine, Ser 4.54  0.50 - 1.35 mg/dL   Calcium 9.2  8.4 - 09.8 mg/dL   Total Protein 6.6  6.0 - 8.3 g/dL   Albumin 2.7 (*) 3.5 - 5.2 g/dL   AST 47 (*) 0 - 37 U/L   ALT 41  0 - 53 U/L   Alkaline Phosphatase 81  39 - 117 U/L   Total Bilirubin 0.6  0.3 - 1.2 mg/dL   GFR calc non Af Amer 52 (*) >90 mL/min   GFR calc Af Amer 60 (*) >90 mL/min   Comment:            The eGFR has been calculated     using the CKD EPI equation.     This calculation has not been     validated in all clinical     situations.     eGFR's persistently     <90 mL/min signify     possible Chronic Kidney Disease.  TROPONIN I     Status: None   Collection Time    09/22/12  3:17 PM      Result Value Range   Troponin I <0.30  <0.30 ng/mL   Comment:            Due to the release kinetics of cTnI,     a negative result within the first hours     of the onset of symptoms does not rule out     myocardial infarction with certainty.     If myocardial infarction is still suspected,     repeat the test at appropriate intervals.  PROTIME-INR     Status: Abnormal   Collection Time    09/22/12  3:17 PM      Result Value Range   Prothrombin Time 16.2 (*) 11.6 - 15.2 seconds   INR 1.33  0.00 - 1.49  LACTIC ACID, PLASMA     Status: None   Collection Time    09/22/12  5:07 PM      Result Value Range   Lactic Acid, Venous 1.9  0.5 - 2.2 mmol/L  CULTURE, BLOOD (ROUTINE X 2)     Status: None   Collection Time    09/22/12  5:10 PM      Result Value Range   Specimen Description BLOOD ARM LEFT     Special Requests BOTTLES DRAWN AEROBIC AND ANAEROBIC 10CC     Culture  Setup Time 09/22/2012 22:30      Culture       Value:        BLOOD CULTURE RECEIVED NO GROWTH TO DATE CULTURE WILL BE HELD FOR 5 DAYS BEFORE ISSUING A FINAL NEGATIVE REPORT   Report Status PENDING    CULTURE, BLOOD (ROUTINE X 2)     Status: None  Collection Time    09/22/12  5:20 PM      Result Value Range   Specimen Description BLOOD HAND RIGHT     Special Requests BOTTLES DRAWN AEROBIC ONLY 5CC     Culture  Setup Time 09/22/2012 22:31     Culture       Value:        BLOOD CULTURE RECEIVED NO GROWTH TO DATE CULTURE WILL BE HELD FOR 5 DAYS BEFORE ISSUING A FINAL NEGATIVE REPORT   Report Status PENDING    CULTURE, BLOOD (ROUTINE X 2)     Status: None   Collection Time    09/22/12  7:12 PM      Result Value Range   Specimen Description BLOOD RIGHT HAND     Special Requests BOTTLES DRAWN AEROBIC ONLY 3CC     Culture  Setup Time 09/22/2012 22:31     Culture       Value:        BLOOD CULTURE RECEIVED NO GROWTH TO DATE CULTURE WILL BE HELD FOR 5 DAYS BEFORE ISSUING A FINAL NEGATIVE REPORT   Report Status PENDING    HIV ANTIBODY (ROUTINE TESTING)     Status: None   Collection Time    09/22/12  7:12 PM      Result Value Range   HIV NON REACTIVE  NON REACTIVE  CULTURE, BLOOD (ROUTINE X 2)     Status: None   Collection Time    09/22/12  7:13 PM      Result Value Range   Specimen Description BLOOD LEFT ARM     Special Requests       Value: BOTTLES DRAWN AEROBIC AND ANAEROBIC 10CC AER,8CC ANA   Culture  Setup Time 09/22/2012 22:30     Culture       Value:        BLOOD CULTURE RECEIVED NO GROWTH TO DATE CULTURE WILL BE HELD FOR 5 DAYS BEFORE ISSUING A FINAL NEGATIVE REPORT   Report Status PENDING    MRSA PCR SCREENING     Status: None   Collection Time    09/22/12  8:35 PM      Result Value Range   MRSA by PCR NEGATIVE  NEGATIVE   Comment:            The GeneXpert MRSA Assay (FDA     approved for NASAL specimens     only), is one component of a     comprehensive MRSA colonization     surveillance program.  It is not     intended to diagnose MRSA     infection nor to guide or     monitor treatment for     MRSA infections.  URINALYSIS, ROUTINE W REFLEX MICROSCOPIC     Status: Abnormal   Collection Time    09/22/12 10:05 PM      Result Value Range   Color, Urine YELLOW  YELLOW   APPearance CLEAR  CLEAR   Specific Gravity, Urine 1.027  1.005 - 1.030   pH 5.5  5.0 - 8.0   Glucose, UA NEGATIVE  NEGATIVE mg/dL   Hgb urine dipstick NEGATIVE  NEGATIVE   Bilirubin Urine NEGATIVE  NEGATIVE   Ketones, ur NEGATIVE  NEGATIVE mg/dL   Protein, ur 30 (*) NEGATIVE mg/dL   Urobilinogen, UA 1.0  0.0 - 1.0 mg/dL   Nitrite NEGATIVE  NEGATIVE   Leukocytes, UA NEGATIVE  NEGATIVE  LEGIONELLA ANTIGEN, URINE     Status:  None   Collection Time    09/22/12 10:05 PM      Result Value Range   Specimen Description URINE, CLEAN CATCH     Special Requests NONE     Legionella Antigen, Urine Negative for Legionella pneumophilia serogroup 1     Report Status 09/23/2012 FINAL    URINE MICROSCOPIC-ADD ON     Status: Abnormal   Collection Time    09/22/12 10:05 PM      Result Value Range   Squamous Epithelial / LPF RARE  RARE   RBC / HPF 3-6  <3 RBC/hpf   Bacteria, UA RARE  RARE   Casts HYALINE CASTS (*) NEGATIVE  COMPREHENSIVE METABOLIC PANEL     Status: Abnormal   Collection Time    09/23/12  6:04 AM      Result Value Range   Sodium 144  135 - 145 mEq/L   Potassium 3.9  3.5 - 5.1 mEq/L   Chloride 109  96 - 112 mEq/L   CO2 26  19 - 32 mEq/L   Glucose, Bld 223 (*) 70 - 99 mg/dL   BUN 21  6 - 23 mg/dL   Creatinine, Ser 3.08  0.50 - 1.35 mg/dL   Calcium 8.9  8.4 - 65.7 mg/dL   Total Protein 6.1  6.0 - 8.3 g/dL   Albumin 2.4 (*) 3.5 - 5.2 g/dL   AST 49 (*) 0 - 37 U/L   ALT 49  0 - 53 U/L   Alkaline Phosphatase 82  39 - 117 U/L   Total Bilirubin 0.5  0.3 - 1.2 mg/dL   GFR calc non Af Amer 58 (*) >90 mL/min   GFR calc Af Amer 67 (*) >90 mL/min   Comment:            The eGFR has been calculated     using  the CKD EPI equation.     This calculation has not been     validated in all clinical     situations.     eGFR's persistently     <90 mL/min signify     possible Chronic Kidney Disease.  CBC     Status: Abnormal   Collection Time    09/23/12  6:04 AM      Result Value Range   WBC 8.7  4.0 - 10.5 K/uL   RBC 3.02 (*) 4.22 - 5.81 MIL/uL   Hemoglobin 9.3 (*) 13.0 - 17.0 g/dL   HCT 84.6 (*) 96.2 - 95.2 %   MCV 90.4  78.0 - 100.0 fL   MCH 30.8  26.0 - 34.0 pg   MCHC 34.1  30.0 - 36.0 g/dL   RDW 84.1  32.4 - 40.1 %   Platelets 209  150 - 400 K/uL  BLOOD GAS, ARTERIAL     Status: Abnormal   Collection Time    09/23/12 10:30 AM      Result Value Range   O2 Content 5.0     Delivery systems NASAL CANNULA     pH, Arterial 7.423  7.350 - 7.450   pCO2 arterial 33.0 (*) 35.0 - 45.0 mmHg   pO2, Arterial 50.8 (*) 80.0 - 100.0 mmHg   Bicarbonate 21.2  20.0 - 24.0 mEq/L   TCO2 22.2  0 - 100 mmol/L   Acid-base deficit 2.6 (*) 0.0 - 2.0 mmol/L   O2 Saturation 83.6     Patient temperature 98.6     Collection site RIGHT RADIAL  Drawn by [phone]     Sample type ARTERIAL DRAW     Allens test (pass/fail) PASS  PASS  VITAMIN B12     Status: Abnormal   Collection Time    09/23/12 11:02 AM      Result Value Range   Vitamin B-12 1074 (*) 211 - 911 pg/mL  FOLATE     Status: None   Collection Time    09/23/12 11:02 AM      Result Value Range   Folate >20.0     Comment: (NOTE)     Reference Ranges            Deficient:       0.4 - 3.3 ng/mL            Indeterminate:   3.4 - 5.4 ng/mL            Normal:              > 5.4 ng/mL  IRON AND TIBC     Status: Abnormal   Collection Time    09/23/12 11:02 AM      Result Value Range   Iron 22 (*) 42 - 135 ug/dL   TIBC 045  409 - 811 ug/dL   Saturation Ratios 10 (*) 20 - 55 %   UIBC 208  125 - 400 ug/dL  FERRITIN     Status: None   Collection Time    09/23/12 11:02 AM      Result Value Range   Ferritin 188  22 - 322 ng/mL  RETICULOCYTES      Status: Abnormal   Collection Time    09/23/12 11:02 AM      Result Value Range   Retic Ct Pct 2.7  0.4 - 3.1 %   RBC. 3.50 (*) 4.22 - 5.81 MIL/uL   Retic Count, Manual 94.5  19.0 - 186.0 K/uL  HEMOGLOBIN A1C     Status: Abnormal   Collection Time    09/23/12 11:02 AM      Result Value Range   Hemoglobin A1C 6.7 (*) <5.7 %   Comment: (NOTE)                                                                               According to the ADA Clinical Practice Recommendations for 2011, when     HbA1c is used as a screening test:      >=6.5%   Diagnostic of Diabetes Mellitus               (if abnormal result is confirmed)     5.7-6.4%   Increased risk of developing Diabetes Mellitus     References:Diagnosis and Classification of Diabetes Mellitus,Diabetes     Care,2011,34(Suppl 1):S62-S69 and Standards of Medical Care in             Diabetes - 2011,Diabetes Care,2011,34 (Suppl 1):S11-S61.   Mean Plasma Glucose 146 (*) <117 mg/dL  PRO B NATRIURETIC PEPTIDE     Status: Abnormal   Collection Time    09/23/12 11:04 AM      Result Value Range   Pro B Natriuretic peptide (BNP) 4943.0 (*) 0 - 450 pg/mL  TROPONIN I     Status: None   Collection Time    09/23/12 11:04 AM      Result Value Range   Troponin I <0.30  <0.30 ng/mL   Comment:            Due to the release kinetics of cTnI,     a negative result within the first hours     of the onset of symptoms does not rule out     myocardial infarction with certainty.     If myocardial infarction is still suspected,     repeat the test at appropriate intervals.  GLUCOSE, CAPILLARY     Status: Abnormal   Collection Time    09/23/12  1:03 PM      Result Value Range   Glucose-Capillary 214 (*) 70 - 99 mg/dL  TROPONIN I     Status: None   Collection Time    09/23/12  4:40 PM      Result Value Range   Troponin I <0.30  <0.30 ng/mL   Comment:            Due to the release kinetics of cTnI,     a negative result within the first hours      of the onset of symptoms does not rule out     myocardial infarction with certainty.     If myocardial infarction is still suspected,     repeat the test at appropriate intervals.  GLUCOSE, CAPILLARY     Status: Abnormal   Collection Time    09/23/12  4:52 PM      Result Value Range   Glucose-Capillary 140 (*) 70 - 99 mg/dL  GLUCOSE, CAPILLARY     Status: Abnormal   Collection Time    09/23/12  9:41 PM      Result Value Range   Glucose-Capillary 181 (*) 70 - 99 mg/dL  TROPONIN I     Status: None   Collection Time    09/23/12 11:42 PM      Result Value Range   Troponin I <0.30  <0.30 ng/mL   Comment:            Due to the release kinetics of cTnI,     a negative result within the first hours     of the onset of symptoms does not rule out     myocardial infarction with certainty.     If myocardial infarction is still suspected,     repeat the test at appropriate intervals.  BASIC METABOLIC PANEL     Status: Abnormal   Collection Time    09/24/12  5:30 AM      Result Value Range   Sodium 143  135 - 145 mEq/L   Potassium 3.4 (*) 3.5 - 5.1 mEq/L   Chloride 99  96 - 112 mEq/L   CO2 28  19 - 32 mEq/L   Glucose, Bld 189 (*) 70 - 99 mg/dL   BUN 30 (*) 6 - 23 mg/dL   Creatinine, Ser 9.60  0.50 - 1.35 mg/dL   Calcium 9.2  8.4 - 45.4 mg/dL   GFR calc non Af Amer 54 (*) >90 mL/min   GFR calc Af Amer 62 (*) >90 mL/min   Comment:            The eGFR has been calculated     using the CKD EPI equation.     This calculation has not been  validated in all clinical     situations.     eGFR's persistently     <90 mL/min signify     possible Chronic Kidney Disease.   Dg Chest 2 View  09/22/2012  *RADIOLOGY REPORT*  Clinical Data: Shortness of breath, chest pain.  CHEST - 2 VIEW  Comparison: July 09, 2012.  Findings: Stable cardiomegaly.  No change in position of left-sided pacemaker.  Interval development of a large right upper lobe opacity as well as smaller left perihilar opacity  concerning for pneumonia or possibly edema.  IMPRESSION: Interval development of bilateral upper lobe opacities with right worse than left, concerning for pneumonia or possibly edema.   Original Report Authenticated By: Lupita Raider.,  M.D.    Ct Head Wo Contrast  09/22/2012  *RADIOLOGY REPORT*  Clinical Data: Weakness  CT HEAD WITHOUT CONTRAST  Technique:  Contiguous axial images were obtained from the base of the skull through the vertex without contrast.  Comparison: CT head 07/15/2008.  MRI 08/11/2011  Findings: Age appropriate atrophy.  Negative for acute infarct. Negative for hemorrhage or mass.  No fluid collection or shift of the midline structures.  Calvarium shows no acute abnormality. Atherosclerotic calcification in the vertebral and carotid arteries bilaterally.  IMPRESSION: No acute intracranial abnormality.   Original Report Authenticated By: Janeece Riggers, M.D.    Dg Chest Port 1 View  09/24/2012  *RADIOLOGY REPORT*  Clinical Data: Evaluate pulmonary congestion  PORTABLE CHEST - 1 VIEW  Comparison: Prior chest x-ray 09/23/2012  Findings: Similar to incrementally improved bilateral interstitial and airspace opacities with increased confluence in the right upper and mid lung.  Stable cardiomegaly with probable left atrial enlargement.  Left subclavian dual lead cardiac rhythm maintenance device in unchanged position.  No pneumothorax.  Possible trace bilateral effusions are stable.  IMPRESSION:  Similar to incrementally improved bilateral interstitial and airspace opacities which may reflect asymmetric edema versus multi focal pneumonia.   Original Report Authenticated By: Malachy Moan, M.D.    Dg Chest Port 1 View  09/23/2012  *RADIOLOGY REPORT*  Clinical Data: 79 year old male shortness of breath and congestion.  PORTABLE CHEST - 1 VIEW  Comparison: 09/22/2012 and earlier.  Findings: Semi upright AP portable view 1115 hours.  More generalized increased vague and nodular pulmonary opacity,  greater in the right lung.  The same time, the right greater than left perihilar opacity yesterday is less confluent.  Stable cardiac size and mediastinal contours.  Left chest cardiac pacemaker. Visualized tracheal air column is within normal limits.  IMPRESSION: Less confluent but more generalized bilateral pulmonary opacity. Differential considerations remain bilateral infection and asymmetric edema.   Original Report Authenticated By: Erskine Speed, M.D.     ROS: General:no colds or fevers, no weight changes Skin:no rashes or ulcers HEENT:no blurred vision, no congestion CV:occ chest pressure with walking to mailbox, though SOB occurs first and he becomes weak. PUL:SOB with exertion. GI:no diarrhea constipation or melena, no indigestion GU:no hematuria, no dysuria MS:no joint pain, no claudication Neuro:no syncope, no lightheadedness Endo:no diabetes, no thyroid disease   Blood pressure 142/64, pulse 99, temperature 97.4 F (36.3 C), temperature source Oral, resp. rate 25, height 5\' 4"  (1.626 m), weight 84 kg (185 lb 3 oz), SpO2 95.00%. PE: General:alert and oriented, does not feel well but pleasant affect. Skin:warm and dry brisk capillary refill HEENT:normocephalic, sclera clear Neck:supple, no JVD no carotid bruits   Heart:irreg irreg, muffled heart sounds. Lungs:diminished breath sounds throughout increased at basses. Abd:+ BS,  soft, non tender, colostomy with stool present brownish green. Ext:no edema, 1+ pedal pulses bil. Neuro:alert and oriented, MAE, follows comments.    Assessment/Plan Principal Problem:   Respiratory failure, acute Active Problems:   PAF, was in SR 09/19/12 now A fib with RVR   HYPERLIPIDEMIA   H/O colostomy   CKD (chronic kidney disease) stage 3, GFR 30-59 ml/min   Hypertension   Pacemaker-St.Jude, implanted June 2013   Chronic anticoagulation, [name]   Healthcare-associated pneumonia  PLAN: Needs diuresing and medication adjust for PAF.  On  pacerone and lopressor.  On lasix 40 BID.  Dr. Herbie Baltimore has seen the pt. Will increase pacerone. Awaiting ECHO.  Will add dig for rate control as well.  INGOLD,LAURA R 09/24/2012, 11:00 AM

## 2012-09-25 DIAGNOSIS — I5031 Acute diastolic (congestive) heart failure: Secondary | ICD-10-CM

## 2012-09-25 LAB — BASIC METABOLIC PANEL
Chloride: 96 mEq/L (ref 96–112)
GFR calc Af Amer: 55 mL/min — ABNORMAL LOW (ref >90)
Potassium: 3.3 mEq/L — ABNORMAL LOW (ref 3.5–5.1)

## 2012-09-25 LAB — PRO B NATRIURETIC PEPTIDE: Pro B Natriuretic peptide (BNP): 8257 pg/mL — ABNORMAL HIGH (ref 0–450)

## 2012-09-25 LAB — GLUCOSE, CAPILLARY: Glucose-Capillary: 248 mg/dL — ABNORMAL HIGH (ref 70–99)

## 2012-09-25 LAB — DIGOXIN LEVEL: Digoxin Level: 1.2 ng/mL (ref 0.8–2.0)

## 2012-09-25 MED ORDER — VANCOMYCIN HCL IN DEXTROSE 1-5 GM/200ML-% IV SOLN
1000.0000 mg | INTRAVENOUS | Status: DC
  Administered 2012-09-26: 1000 mg via INTRAVENOUS
  Filled 2012-09-25 (×2): qty 200

## 2012-09-25 NOTE — Progress Notes (Signed)
Utilization Review Completed.  09/25/2012 

## 2012-09-25 NOTE — Evaluation (Signed)
Physical Therapy Evaluation Patient Details Name: [name] [name] MRN: [id number] DOB: 07-07-1922 Today's Date: 09/25/2012 Time: 7829-5621 PT Time Calculation (min): 25 min  PT Assessment / Plan / Recommendation Clinical Impression  Pt admitted with SOB, PNa, Afib and required Bipap. Pt currently on 5L [place] with sats 96% at rest with drop to 84% with ambulation, HR 85-93 with activity. Pt reports he has been using girlfriend's oxygen for the last 3 weeks. Pt will benefit from acute therapy to maximize activity tolerance, use of DME, and cardiopulmonary status prior to return home to increase independence and decrease burden of care.     PT Assessment  Patient needs continued PT services    Follow Up Recommendations  Home health PT    Does the patient have the potential to tolerate intense rehabilitation      Barriers to Discharge Decreased caregiver support      Equipment Recommendations  Rolling walker with 5" wheels    Recommendations for Other Services OT consult   Frequency Min 3X/week    Precautions / Restrictions Precautions Precautions: Fall Precaution Comments: watch O2 sats, ostomy  Pertinent Vitals/Pain No pain      Mobility  Bed Mobility Bed Mobility: Supine to Sit Supine to Sit: 5: Supervision;HOB flat;With rails Details for Bed Mobility Assistance: increased time to complete with cueing for use of rails Transfers Transfers: Sit to Stand;Stand to Sit;Stand Pivot Transfers Sit to Stand: 4: Min guard;From bed;From chair/3-in-1 Stand to Sit: 4: Min guard;To chair/3-in-1 Stand Pivot Transfers: 4: Min guard Details for Transfer Assistance: cueing for hand placement and safety Ambulation/Gait Ambulation/Gait Assistance: 4: Min guard Ambulation Distance (Feet): 75 Feet Assistive device: Rolling walker Ambulation/Gait Assistance Details: cueing for posture and position in RW as well as cueing for breathing technique sats dropping to 84% on 6L [place] with ambulation  and distance limited due to pulmonary status Gait Pattern: Step-through pattern;Decreased stride length;Trunk flexed Gait velocity: decreased Stairs: No    Exercises     PT Diagnosis: Difficulty walking  PT Problem List: Decreased activity tolerance;Cardiopulmonary status limiting activity PT Treatment Interventions: Gait training;DME instruction;Functional mobility training;Therapeutic activities;Therapeutic exercise;Patient/family education   PT Goals Acute Rehab PT Goals PT Goal Formulation: With patient Time For Goal Achievement: 10/09/12 Potential to Achieve Goals: Good Pt will go Supine/Side to Sit: with modified independence;with HOB 0 degrees PT Goal: Supine/Side to Sit - Progress: Goal set today Pt will go Sit to Supine/Side: with modified independence;with HOB 0 degrees PT Goal: Sit to Supine/Side - Progress: Goal set today Pt will go Sit to Stand: with modified independence PT Goal: Sit to Stand - Progress: Goal set today Pt will go Stand to Sit: with modified independence PT Goal: Stand to Sit - Progress: Goal set today Pt will Ambulate: >150 feet;with least restrictive assistive device;with modified independence (with O2 sats >90%) PT Goal: Ambulate - Progress: Goal set today Pt will Go Up / Down Stairs: 1-2 stairs;with min assist;with least restrictive assistive device PT Goal: Up/Down Stairs - Progress: Goal set today  Visit Information  Last PT Received On: 09/25/12 Assistance Needed: +1    Subjective Data  Subjective: I live with my girlfriend Patient Stated Goal: be able to go home   Prior Functioning  Home Living Lives With: Significant other Available Help at Discharge: Family;Available PRN/intermittently Type of Home: House Home Access: Stairs to enter Entrance Stairs-Number of Steps: 1 Entrance Stairs-Rails: None Home Layout: One level Firefighter: Standard Home Adaptive Equipment: Straight cane;Shower chair with back  Prior Function Level of  Independence: Needs assistance Needs Assistance: Light Housekeeping;Meal Prep Meal Prep: Moderate Light Housekeeping: Maximal Able to Take Stairs?: Yes Driving: Yes Vocation: Retired Comments: Pt reports he and girlfriend do limited cooking and housekeeper does some cooking. He uses power cart in grocery store and is only able to ambulate limited distance at baseline grossly 200' max Communication Communication: HOH;Other (comment) (bil hearing aids)    Cognition  Cognition Overall Cognitive Status: Appears within functional limits for tasks assessed/performed Arousal/Alertness: Awake/alert Orientation Level: Appears intact for tasks assessed Behavior During Session: [facility] for tasks performed    Extremity/Trunk Assessment Right Upper Extremity Assessment RUE ROM/Strength/Tone: [facility] for tasks assessed Left Upper Extremity Assessment LUE ROM/Strength/Tone: WFL for tasks assessed Right Lower Extremity Assessment RLE ROM/Strength/Tone: [facility] for tasks assessed Left Lower Extremity Assessment LLE ROM/Strength/Tone: [facility] for tasks assessed Trunk Assessment Trunk Assessment: Normal   Balance Static Sitting Balance Static Sitting - Balance Support: No upper extremity supported;Feet supported Static Sitting - Level of Assistance: 6: Modified independent (Device/Increase time) Static Sitting - Comment/# of Minutes: pt able to reach forward to feet to don socks without LOB but required min assist to fully don socks  End of Session PT - End of Session Equipment Utilized During Treatment: Gait belt;Oxygen Activity Tolerance: Patient tolerated treatment well Patient left: in chair;with call bell/phone within reach Nurse Communication: Mobility status  GP     Toney Sang [facility] 09/25/2012, 8:51 AM Delaney Meigs, PT [phone]

## 2012-09-25 NOTE — Progress Notes (Signed)
[facility] PROGRESS NOTE  [name] [name] AOZ:[id number] DOB: 09/13/1921 DOA: 09/22/2012 PCP: Carollee Herter, MD  Assessment/Plan:  Acute hypoxic respiratory failure due to Healthcare-associated pneumonia/copd exacerbation/ Fluid overload:   - Shortness of breath with cough and a chest x-ray that shows bilateral upper lobes infiltrates. His white count is borderline high to this most likely healthcare associated pneumonia as he was in the hospital less than 90 days ago.  He was started on vancomycin zosyn and Levaquin, blood cultures and sputum cultures pending.  Started him on Solu-Medrol on admission, he is currently not wheezing, we will do a quick taper in the next 3 days.  -  Lactic acid is normal, he was started on fluids on admission and on 2/8 patient was found to be in acute distress with bilateral rhonchi and basilar rales, requiring 6 liters of nasal oxygen with oxygen sats in upper 80's. He was given a doseof IV lasix and an ABG done, . He was hypoxemic on 5 lit [place] oxygen. BIpap was ordered and he was transferred to step down. He was started on IV bid Lasix - ordered 2 D echocardiogram, report showed good LVEF, with possibly diastolic dysfunction, but was limited evaluation because of atrial fibrillation, serial enzymes negative. Also check strict I.'s and O.'s and daily weights. - repeat cxr in am shows improvement.     CKD (chronic kidney disease) stage 3, GFR 30-59 ml/min  - Creatinine is close to baseline.   - We'll go ahead and hold his ARB, was his blood pressures start trending up we can restarted.   Pacemaker-St.Jude, implanted June 2013 / Atrial Fibrillation:  - On Chronic anticoagulation, [name].and amiodarone. Rate is suboptimal today between 100 to 110.   H/O colostomy  - Stable no signs infection.   Hypertension:  - I will go ahead and hold his ARB and calcium channel blocker., he is on 2 beta blockers I will hold atenolol continue the metoprolol,  we'll go ahead and also continue the amiodarone.  Anemia: normocytic. Anemia panel ordered.  ? Diabetes Mellitus: SSI. HGba1c is 6.7 CBG (last 3)   Recent Labs  09/24/12 0740 09/24/12 1340 09/24/12 1717  GLUCAP 193* 209* 134*      Code Status: full code Family Communication: none at bedside Disposition Plan: possibly in 2 to 3days.      Antibiotics:  Vancomycin 2/7  Zosyn 2/7  levaquin 2/7  HPI/Subjective: Denies any pain, tachypneic.  Objective: Filed Vitals:   09/25/12 0000 09/25/12 0231 09/25/12 0300 09/25/12 0841  BP: 153/73 146/75 154/72   Pulse: 71 79 74 93  Temp: 98.1 F (36.7 C)  98.1 F (36.7 C)   TempSrc: Oral  Oral   Resp: 22  23   Height:      Weight:      SpO2: 94%  96% 86%    Intake/Output Summary (Last 24 hours) at 09/25/12 0845 Last data filed at 09/24/12 2241  Gross per 24 hour  Intake    590 ml  Output   1976 ml  Net  -1386 ml   Filed Weights   09/22/12 2023 09/23/12 0504 09/24/12 0500  Weight: 85.276 kg (188 lb) 86.728 kg (191 lb 3.2 oz) 84 kg (185 lb 3 oz)    Exam:   General:  Alert afebrile in mild respiratory distress. He is 5 lit [place] oxygen  Cardiovascular: s1s2 RRR.  Respiratory: FAIR air entry bilateral, scattered rhonchi.   Abdomen: sof t NT ND BS+. Colostomy bag .  Data Reviewed: Basic Metabolic Panel:  Recent Labs Lab 09/22/12 1506 09/23/12 0604 09/24/12 0530 09/25/12 0410  NA 140 144 143 140  K 3.5 3.9 3.4* 3.3*  CL 104 109 99 96  CO2 23 26 28 30   GLUCOSE 161* 223* 189* 255*  BUN 22 21 30* 40*  CREATININE 1.20 1.10 1.17 1.29  CALCIUM 9.2 8.9 9.2 8.8  MG  --   --   --  2.1   Liver Function Tests:  Recent Labs Lab 09/22/12 1506 09/23/12 0604  AST 47* 49*  ALT 41 49  ALKPHOS 81 82  BILITOT 0.6 0.5  PROT 6.6 6.1  ALBUMIN 2.7* 2.4*   No results found for this basename: LIPASE, AMYLASE,  in the last 168 hours No results found for this basename: AMMONIA,  in the last 168  hours CBC:  Recent Labs Lab 09/22/12 1506 09/23/12 0604  WBC 10.1 8.7  NEUTROABS 8.3*  --   HGB 9.8* 9.3*  HCT 28.3* 27.3*  MCV 90.1 90.4  PLT 224 209   Cardiac Enzymes:  Recent Labs Lab 09/22/12 1517 09/23/12 1104 09/23/12 1640 09/23/12 2342  TROPONINI <0.30 <0.30 <0.30 <0.30   BNP (last 3 results)  Recent Labs  02/06/12 1416 09/23/12 1104 09/25/12 0410  PROBNP 3850.0* 4943.0* 8257.0*   CBG:  Recent Labs Lab 09/23/12 1652 09/23/12 2141 09/24/12 0740 09/24/12 1340 09/24/12 1717  GLUCAP 140* 181* 193* 209* 134*    Recent Results (from the past 240 hour(s))  CULTURE, BLOOD (ROUTINE X 2)     Status: None   Collection Time    09/22/12  5:10 PM      Result Value Range Status   Specimen Description BLOOD ARM LEFT   Final   Special Requests BOTTLES DRAWN AEROBIC AND ANAEROBIC 10CC   Final   Culture  Setup Time 09/22/2012 22:30   Final   Culture     Final   Value:        BLOOD CULTURE RECEIVED NO GROWTH TO DATE CULTURE WILL BE HELD FOR 5 DAYS BEFORE ISSUING A FINAL NEGATIVE REPORT   Report Status PENDING   Incomplete  CULTURE, BLOOD (ROUTINE X 2)     Status: None   Collection Time    09/22/12  5:20 PM      Result Value Range Status   Specimen Description BLOOD HAND RIGHT   Final   Special Requests BOTTLES DRAWN AEROBIC ONLY 5CC   Final   Culture  Setup Time 09/22/2012 22:31   Final   Culture     Final   Value:        BLOOD CULTURE RECEIVED NO GROWTH TO DATE CULTURE WILL BE HELD FOR 5 DAYS BEFORE ISSUING A FINAL NEGATIVE REPORT   Report Status PENDING   Incomplete  CULTURE, BLOOD (ROUTINE X 2)     Status: None   Collection Time    09/22/12  7:12 PM      Result Value Range Status   Specimen Description BLOOD RIGHT HAND   Final   Special Requests BOTTLES DRAWN AEROBIC ONLY 3CC   Final   Culture  Setup Time 09/22/2012 22:31   Final   Culture     Final   Value:        BLOOD CULTURE RECEIVED NO GROWTH TO DATE CULTURE WILL BE HELD FOR 5 DAYS BEFORE ISSUING A  FINAL NEGATIVE REPORT   Report Status PENDING   Incomplete  CULTURE, BLOOD (ROUTINE X 2)  Status: None   Collection Time    09/22/12  7:13 PM      Result Value Range Status   Specimen Description BLOOD LEFT ARM   Final   Special Requests     Final   Value: BOTTLES DRAWN AEROBIC AND ANAEROBIC 10CC AER,8CC ANA   Culture  Setup Time 09/22/2012 22:30   Final   Culture     Final   Value:        BLOOD CULTURE RECEIVED NO GROWTH TO DATE CULTURE WILL BE HELD FOR 5 DAYS BEFORE ISSUING A FINAL NEGATIVE REPORT   Report Status PENDING   Incomplete  MRSA PCR SCREENING     Status: None   Collection Time    09/22/12  8:35 PM      Result Value Range Status   MRSA by PCR NEGATIVE  NEGATIVE Final   Comment:            The GeneXpert MRSA Assay (FDA     approved for NASAL specimens     only), is one component of a     comprehensive MRSA colonization     surveillance program. It is not     intended to diagnose MRSA     infection nor to guide or     monitor treatment for     MRSA infections.     Studies: Dg Chest Port 1 View  09/24/2012  *RADIOLOGY REPORT*  Clinical Data: Evaluate pulmonary congestion  PORTABLE CHEST - 1 VIEW  Comparison: Prior chest x-ray 09/23/2012  Findings: Similar to incrementally improved bilateral interstitial and airspace opacities with increased confluence in the right upper and mid lung.  Stable cardiomegaly with probable left atrial enlargement.  Left subclavian dual lead cardiac rhythm maintenance device in unchanged position.  No pneumothorax.  Possible trace bilateral effusions are stable.  IMPRESSION:  Similar to incrementally improved bilateral interstitial and airspace opacities which may reflect asymmetric edema versus multi focal pneumonia.   Original Report Authenticated By: Malachy Moan, M.D.    Dg Chest Port 1 View  09/23/2012  *RADIOLOGY REPORT*  Clinical Data: 79 year old male shortness of breath and congestion.  PORTABLE CHEST - 1 VIEW  Comparison:  09/22/2012 and earlier.  Findings: Semi upright AP portable view 1115 hours.  More generalized increased vague and nodular pulmonary opacity, greater in the right lung.  The same time, the right greater than left perihilar opacity yesterday is less confluent.  Stable cardiac size and mediastinal contours.  Left chest cardiac pacemaker. Visualized tracheal air column is within normal limits.  IMPRESSION: Less confluent but more generalized bilateral pulmonary opacity. Differential considerations remain bilateral infection and asymmetric edema.   Original Report Authenticated By: Erskine Speed, M.D.     Scheduled Meds: . amiodarone  400 mg Oral BID  . antiseptic oral rinse  15 mL Mouth Rinse BID  . apixaban  2.5 mg Oral BID  . ceFEPime (MAXIPIME) IV  1 g Intravenous Q24H  . digoxin  0.25 mg Oral Daily  . furosemide  40 mg Intravenous BID  . insulin aspart  0-15 Units Subcutaneous TID WC  . insulin aspart  0-5 Units Subcutaneous QHS  . levofloxacin (LEVAQUIN) IV  750 mg Intravenous Q48H  . metoprolol tartrate  25 mg Oral Q8H  . potassium chloride  40 mEq Oral Daily  . predniSONE  60 mg Oral QAC breakfast  . sodium chloride  3 mL Intravenous Q12H  . vancomycin  750 mg Intravenous Q12H   Continuous  Infusions:   Principal Problem:   Respiratory failure, acute Active Problems:   HYPERLIPIDEMIA   Hypertension   H/O colostomy   CKD (chronic kidney disease) stage 3, GFR 30-59 ml/min   PAF, was in SR 09/19/12 now A fib with RVR   Pacemaker-St.Jude, implanted June 2013   Chronic anticoagulation, [name]   Healthcare-associated pneumonia   Acute diastolic heart failure - due to PNA & Afib RVR    Time spent: 28 min    [name] [name]  [facility] Pager [phone]. If 8PM-8AM, please contact night-coverage at [email or web address], password [facility] 09/25/2012, 8:45 AM  LOS: 3 days

## 2012-09-25 NOTE — Progress Notes (Signed)
[facility]  DAILY PROGRESS NOTE   Subjective:  Breathing has improved, not yet at baseline. CXR also shows some improvement, but still suggests pulmonary edema. BNP is actually higher and well above baseline. Echo (performed in AFib) also suggests elevated filling pressures (acute on chronic left heart failure due to diastolic dysfunction). The aortic valve has severe degenerative changes, but is at most moderately stenotic.  Objective:  Temp:  [97.8 F (36.6 C)-98.4 F (36.9 C)] 98.4 F (36.9 C) (02/10 0800) Pulse Rate:  [71-93] 93 (02/10 0841) Resp:  [15-25] 16 (02/10 0800) BP: (134-159)/(68-88) 134/68 mmHg (02/10 0800) SpO2:  [86 %-96 %] 86 % (02/10 0841) Weight change:   Intake/Output from previous day: 02/09 0701 - 02/10 0700 In: 960 [P.O.:660; IV Piggyback:300] Out: 1976 [Urine:1975; Stool:1]  Intake/Output from this shift: Total I/O In: 120 [P.O.:120] Out: 100 [Urine:100]  Medications: Current Facility-Administered Medications  Medication Dose Route Frequency Provider Last Rate Last Dose  . acetaminophen (TYLENOL) tablet 650 mg  650 mg Oral Q6H PRN Marinda Elk, MD       Or  . acetaminophen (TYLENOL) suppository 650 mg  650 mg Rectal Q6H PRN Marinda Elk, MD      . albuterol (PROVENTIL) (5 MG/ML) 0.5% nebulizer solution 2.5 mg  2.5 mg Nebulization Q2H PRN Marinda Elk, MD   2.5 mg at 09/22/12 2109  . amiodarone (PACERONE) tablet 400 mg  400 mg Oral BID Nada Boozer, NP   400 mg at 09/25/12 0912  . antiseptic oral rinse (BIOTENE) solution 15 mL  15 mL Mouth Rinse BID Marinda Elk, MD   15 mL at 09/25/12 [phone]  . apixaban (ELIQUIS) tablet 2.5 mg  2.5 mg Oral BID Marinda Elk, MD   2.5 mg at 09/25/12 0912  . ceFEPIme (MAXIPIME) 1 g in dextrose 5 % 50 mL IVPB  1 g Intravenous Q24H Christella Hartigan, PHARMD   1 g at 09/24/12 2241  . digoxin (LANOXIN) tablet 0.25 mg  0.25 mg Oral Daily Nada Boozer, NP   0.25 mg  at 09/25/12 0912  . furosemide (LASIX) injection 40 mg  40 mg Intravenous BID Kathlen Mody, MD   40 mg at 09/25/12 9604  . haloperidol lactate (HALDOL) injection 1 mg  1 mg Intravenous Q6H PRN Marinda Elk, MD      . insulin aspart (novoLOG) injection 0-15 Units  0-15 Units Subcutaneous TID WC Kathlen Mody, MD   5 Units at 09/25/12 [phone]  . insulin aspart (novoLOG) injection 0-5 Units  0-5 Units Subcutaneous QHS Kathlen Mody, MD      . levofloxacin (LEVAQUIN) IVPB 750 mg  750 mg Intravenous Q48H Christella Hartigan, PHARMD   750 mg at 09/24/12 1727  . metoprolol tartrate (LOPRESSOR) tablet 25 mg  25 mg Oral Q8H Nada Boozer, NP   25 mg at 09/25/12 0912  . ondansetron (ZOFRAN) tablet 4 mg  4 mg Oral Q6H PRN Marinda Elk, MD       Or  . ondansetron [facility]) injection 4 mg  4 mg Intravenous Q6H PRN Marinda Elk, MD      . polyethylene glycol [facility] Hal) packet 17 g  17 g Oral Daily PRN Marinda Elk, MD      . potassium chloride SA (K-DUR,KLOR-CON) CR tablet 40 mEq  40 mEq Oral Daily Kathlen Mody, MD   40 mEq at 09/25/12 0913  . predniSONE (DELTASONE) tablet 60 mg  60  mg Oral QAC breakfast Kathlen Mody, MD   60 mg at 09/25/12 1610  . sodium chloride 0.9 % injection 3 mL  3 mL Intravenous Q12H Marinda Elk, MD   3 mL at 09/25/12 0912  . [START ON 09/26/2012] vancomycin (VANCOCIN) IVPB 1000 mg/200 mL premix  1,000 mg Intravenous Q24H Kathlen Mody, MD        Physical Exam: General appearance: alert, cooperative and no distress Neck: no adenopathy, no carotid bruit, no JVD, supple, symmetrical, trachea midline and thyroid not enlarged, symmetric, no tenderness/mass/nodules Lungs: clear to auscultation bilaterally Heart: irregularly irregular rhythm, S2: paradoxically splitting, no S3 or S4 and systolic murmur: early systolic 2/6, crescendo and decrescendo at 2nd right intercostal space, at lower left sternal border Abdomen: soft, non-tender; bowel sounds normal; no  masses,  no organomegaly Extremities: extremities normal, atraumatic, no cyanosis or edema Skin: Skin color, texture, turgor normal. No rashes or lesions Neurologic: Grossly normal  Lab Results: Results for orders placed during the hospital encounter of 09/22/12 (from the past 48 hour(s))  GLUCOSE, CAPILLARY     Status: Abnormal   Collection Time    09/23/12  1:03 PM      Result Value Range   Glucose-Capillary 214 (*) 70 - 99 mg/dL  TROPONIN I     Status: None   Collection Time    09/23/12  4:40 PM      Result Value Range   Troponin I <0.30  <0.30 ng/mL   Comment:            Due to the release kinetics of cTnI,     a negative result within the first hours     of the onset of symptoms does not rule out     myocardial infarction with certainty.     If myocardial infarction is still suspected,     repeat the test at appropriate intervals.  GLUCOSE, CAPILLARY     Status: Abnormal   Collection Time    09/23/12  4:52 PM      Result Value Range   Glucose-Capillary 140 (*) 70 - 99 mg/dL  GLUCOSE, CAPILLARY     Status: Abnormal   Collection Time    09/23/12  9:41 PM      Result Value Range   Glucose-Capillary 181 (*) 70 - 99 mg/dL  TROPONIN I     Status: None   Collection Time    09/23/12 11:42 PM      Result Value Range   Troponin I <0.30  <0.30 ng/mL   Comment:            Due to the release kinetics of cTnI,     a negative result within the first hours     of the onset of symptoms does not rule out     myocardial infarction with certainty.     If myocardial infarction is still suspected,     repeat the test at appropriate intervals.  BASIC METABOLIC PANEL     Status: Abnormal   Collection Time    09/24/12  5:30 AM      Result Value Range   Sodium 143  135 - 145 mEq/L   Potassium 3.4 (*) 3.5 - 5.1 mEq/L   Chloride 99  96 - 112 mEq/L   CO2 28  19 - 32 mEq/L   Glucose, Bld 189 (*) 70 - 99 mg/dL   BUN 30 (*) 6 - 23 mg/dL   Creatinine, Ser 9.60  0.50 -  1.35 mg/dL    Calcium 9.2  8.4 - 40.9 mg/dL   GFR calc non Af Amer 54 (*) >90 mL/min   GFR calc Af Amer 62 (*) >90 mL/min   Comment:            The eGFR has been calculated     using the CKD EPI equation.     This calculation has not been     validated in all clinical     situations.     eGFR's persistently     <90 mL/min signify     possible Chronic Kidney Disease.  GLUCOSE, CAPILLARY     Status: Abnormal   Collection Time    09/24/12  7:40 AM      Result Value Range   Glucose-Capillary 193 (*) 70 - 99 mg/dL  TSH     Status: None   Collection Time    09/24/12 12:50 PM      Result Value Range   TSH 1.999  0.350 - 4.500 uIU/mL  GLUCOSE, CAPILLARY     Status: Abnormal   Collection Time    09/24/12  1:40 PM      Result Value Range   Glucose-Capillary 209 (*) 70 - 99 mg/dL  GLUCOSE, CAPILLARY     Status: Abnormal   Collection Time    09/24/12  5:17 PM      Result Value Range   Glucose-Capillary 134 (*) 70 - 99 mg/dL  BASIC METABOLIC PANEL     Status: Abnormal   Collection Time    09/25/12  4:10 AM      Result Value Range   Sodium 140  135 - 145 mEq/L   Potassium 3.3 (*) 3.5 - 5.1 mEq/L   Chloride 96  96 - 112 mEq/L   CO2 30  19 - 32 mEq/L   Glucose, Bld 255 (*) 70 - 99 mg/dL   BUN 40 (*) 6 - 23 mg/dL   Creatinine, Ser 8.11  0.50 - 1.35 mg/dL   Calcium 8.8  8.4 - 91.4 mg/dL   GFR calc non Af Amer 48 (*) >90 mL/min   GFR calc Af Amer 55 (*) >90 mL/min   Comment:            The eGFR has been calculated     using the CKD EPI equation.     This calculation has not been     validated in all clinical     situations.     eGFR's persistently     <90 mL/min signify     possible Chronic Kidney Disease.  PRO B NATRIURETIC PEPTIDE     Status: Abnormal   Collection Time    09/25/12  4:10 AM      Result Value Range   Pro B Natriuretic peptide (BNP) 8257.0 (*) 0 - 450 pg/mL  MAGNESIUM     Status: None   Collection Time    09/25/12  4:10 AM      Result Value Range   Magnesium 2.1  1.5 -  2.5 mg/dL  DIGOXIN LEVEL     Status: None   Collection Time    09/25/12  4:10 AM      Result Value Range   Digoxin Level 1.2  0.8 - 2.0 ng/mL  GLUCOSE, CAPILLARY     Status: Abnormal   Collection Time    09/25/12  9:16 AM      Result Value Range   Glucose-Capillary 231 (*) 70 - 99 mg/dL  Imaging: Dg Chest Port 1 View  09/24/2012  *RADIOLOGY REPORT*  Clinical Data: Evaluate pulmonary congestion  PORTABLE CHEST - 1 VIEW  Comparison: Prior chest x-ray 09/23/2012  Findings: Similar to incrementally improved bilateral interstitial and airspace opacities with increased confluence in the right upper and mid lung.  Stable cardiomegaly with probable left atrial enlargement.  Left subclavian dual lead cardiac rhythm maintenance device in unchanged position.  No pneumothorax.  Possible trace bilateral effusions are stable.  IMPRESSION:  Similar to incrementally improved bilateral interstitial and airspace opacities which may reflect asymmetric edema versus multi focal pneumonia.   Original Report Authenticated By: Malachy Moan, M.D.     Assessment:  1. Principal Problem: 2.   Respiratory failure, acute 3. Active Problems: 4.   HYPERLIPIDEMIA 5.   Hypertension 6.   H/O colostomy 7.   CKD (chronic kidney disease) stage 3, GFR 30-59 ml/min 8.   PAF, was in SR 09/19/12 now A fib with RVR 9.   Pacemaker-[employer]ude, implanted June 2013 10.   Chronic anticoagulation, [name] 11.   Healthcare-associated pneumonia 12.   Acute diastolic heart failure - due to PNA & Afib RVR 13.   Plan: Acute diastolic heart failure - due to PNA & Afib RVR does appear to be the primary diagnosis. Cardioversion should be deferred until amiodarone"builds up" to a therapeutic level (2-3 weeks at least). I think it likely that he he will revert to NSR/atrial paced rhythm during that time. Continue diuretics. Importantly, he confirms that his dyspnea began roughly one week before initiation of amiodarone therapy, which  pleads against "amiodarone lung" as a cause of his infiltrates and dyspnea.  Time Spent Directly with Patient:  354 minutes  Length of Stay:  LOS: 3 days    Canary Fister 09/25/2012, 12:11 PM

## 2012-09-25 NOTE — Progress Notes (Signed)
ANTIBIOTIC CONSULT NOTE - INITIAL  Pharmacy Consult for Vancomycin Indication: rule out pneumonia  No Known Allergies  Patient Measurements: Height: 5\' 4"  (162.6 cm) Weight: 185 lb 3 oz (84 kg) IBW/kg (Calculated) : 59.2   Vital Signs: Temp: 98.4 F (36.9 C) (02/10 0800) Temp src: Oral (02/10 0800) BP: 134/68 mmHg (02/10 0800) Pulse Rate: 93 (02/10 0841) Intake/Output from previous day: 02/09 0701 - 02/10 0700 In: 960 [P.O.:660; IV Piggyback:300] Out: 1976 [Urine:1975; Stool:1] Intake/Output from this shift: Total I/O In: 120 [P.O.:120] Out: 100 [Urine:100]  Labs:  Recent Labs  09/22/12 1506 09/23/12 0604 09/24/12 0530 09/25/12 0410  WBC 10.1 8.7  --   --   HGB 9.8* 9.3*  --   --   PLT 224 209  --   --   CREATININE 1.20 1.10 1.17 1.29   Estimated Creatinine Clearance: 38.7 ml/min (by C-G formula based on Cr of 1.29). No results found for this basename: VANCOTROUGH, VANCOPEAK, VANCORANDOM, GENTTROUGH, GENTPEAK, GENTRANDOM, TOBRATROUGH, TOBRAPEAK, TOBRARND, AMIKACINPEAK, AMIKACINTROU, AMIKACIN,  in the last 72 hours   Microbiology: Recent Results (from the past 720 hour(s))  CULTURE, BLOOD (ROUTINE X 2)     Status: None   Collection Time    09/22/12  5:10 PM      Result Value Range Status   Specimen Description BLOOD ARM LEFT   Final   Special Requests BOTTLES DRAWN AEROBIC AND ANAEROBIC 10CC   Final   Culture  Setup Time 09/22/2012 22:30   Final   Culture     Final   Value:        BLOOD CULTURE RECEIVED NO GROWTH TO DATE CULTURE WILL BE HELD FOR 5 DAYS BEFORE ISSUING A FINAL NEGATIVE REPORT   Report Status PENDING   Incomplete  CULTURE, BLOOD (ROUTINE X 2)     Status: None   Collection Time    09/22/12  5:20 PM      Result Value Range Status   Specimen Description BLOOD HAND RIGHT   Final   Special Requests BOTTLES DRAWN AEROBIC ONLY 5CC   Final   Culture  Setup Time 09/22/2012 22:31   Final   Culture     Final   Value:        BLOOD CULTURE RECEIVED NO  GROWTH TO DATE CULTURE WILL BE HELD FOR 5 DAYS BEFORE ISSUING A FINAL NEGATIVE REPORT   Report Status PENDING   Incomplete  CULTURE, BLOOD (ROUTINE X 2)     Status: None   Collection Time    09/22/12  7:12 PM      Result Value Range Status   Specimen Description BLOOD RIGHT HAND   Final   Special Requests BOTTLES DRAWN AEROBIC ONLY 3CC   Final   Culture  Setup Time 09/22/2012 22:31   Final   Culture     Final   Value:        BLOOD CULTURE RECEIVED NO GROWTH TO DATE CULTURE WILL BE HELD FOR 5 DAYS BEFORE ISSUING A FINAL NEGATIVE REPORT   Report Status PENDING   Incomplete  CULTURE, BLOOD (ROUTINE X 2)     Status: None   Collection Time    09/22/12  7:13 PM      Result Value Range Status   Specimen Description BLOOD LEFT ARM   Final   Special Requests     Final   Value: BOTTLES DRAWN AEROBIC AND ANAEROBIC 10CC AER,8CC ANA   Culture  Setup Time 09/22/2012 22:30   Final  Culture     Final   Value:        BLOOD CULTURE RECEIVED NO GROWTH TO DATE CULTURE WILL BE HELD FOR 5 DAYS BEFORE ISSUING A FINAL NEGATIVE REPORT   Report Status PENDING   Incomplete  MRSA PCR SCREENING     Status: None   Collection Time    09/22/12  8:35 PM      Result Value Range Status   MRSA by PCR NEGATIVE  NEGATIVE Final   Comment:            The GeneXpert MRSA Assay (FDA     approved for NASAL specimens     only), is one component of a     comprehensive MRSA colonization     surveillance program. It is not     intended to diagnose MRSA     infection nor to guide or     monitor treatment for     MRSA infections.    Medical History: Past Medical History  Diagnosis Date  . Depression   . Aneurysm of abdominal aorta     [facility], Dr. Royann Shivers  . History of colon cancer     hospitalization 1999  . Degenerative disc disease, lumbar   . History of non-Hodgkin's lymphoma     hospitalization 2002  . Cataract   . Hard of hearing     wears hearing aids  . Hypertension   . Hyperlipidemia   .  Thrombocytopenia     Dr. Darnelle Catalan  . Chronic renal disease   . Memory loss   . Hyperglycemia   . Edema   . Heart murmur     Echocardiogram 7/10, stress test 2008, [facility]  . Overactive bladder   . Anemia     Dr. Darnelle Catalan  . AV block, 2nd degree 02/06/2012  . Lymphoma, history of 02/06/2012  . H/O colostomy 02/06/2012  . History of AAA (abdominal aortic aneurysm) repair 02/06/2012  . CKD (chronic kidney disease) stage 3, GFR 30-59 ml/min 02/06/2012  . Atrial fibrillation 01/2012    cardioversion to NSR  . Pacemaker 02/08/2012    [employer] DR RF dual-chamber pacemaker. Dr. Hillis Range  . PAF, was in SR 09/19/12 now A fib 02/08/2012    Medications:  Prescriptions prior to admission  Medication Sig Dispense Refill  . amiodarone (PACERONE) 200 MG tablet Take 200 mg by mouth daily.      Marland Kitchen amLODipine-valsartan (EXFORGE) 5-160 MG per tablet Take 1 tablet by mouth every evening.  30 tablet  5  . atenolol (TENORMIN) 25 MG tablet Take 25 mg by mouth daily.      Marland Kitchen escitalopram (LEXAPRO) 20 MG tablet Take 1 tablet (20 mg total) by mouth daily.  90 tablet  0  . metoprolol tartrate (LOPRESSOR) 25 MG tablet Take 1 tablet (25 mg total) by mouth 2 (two) times daily.  60 tablet  5  . niacin (NIASPAN) 500 MG CR tablet Take 1 tablet (500 mg total) by mouth at bedtime.  30 tablet  11  . pantoprazole (PROTONIX) 40 MG tablet Take 1 tablet (40 mg total) by mouth daily at 12 noon.  30 tablet  5  . silodosin (RAPAFLO) 8 MG CAPS capsule Take 1 capsule (8 mg total) by mouth every morning.  90 capsule  2  . apixaban (ELIQUIS) 5 MG TABS tablet Take 0.5 tablets (2.5 mg total) by mouth 2 (two) times daily.  90 tablet  0   Assessment: [name] [name]  is being admitted for worsening SOB and lethargy. Noted he has been in the hospital within the last 90 days, and as as result is being initiated on broad spectrum abx. On D3 abx now. All cultures are neg to date. Scr is trending up a bit. Will adjust abx dose.    Goal  of Therapy:  Vancomycin trough level 15-20 mcg/ml  Plan:  1. Change vanc to 1g IV q24 2. Cont cefepime 1g IV q24 3. Cont levaquin 750mg  IV q48 4. Consider de-escalating abx

## 2012-09-26 ENCOUNTER — Ambulatory Visit: Payer: Medicare Other | Admitting: Medical

## 2012-09-26 LAB — BASIC METABOLIC PANEL
BUN: 42 mg/dL — ABNORMAL HIGH (ref 6–23)
GFR calc non Af Amer: 46 mL/min — ABNORMAL LOW (ref >90)
Glucose, Bld: 143 mg/dL — ABNORMAL HIGH (ref 70–99)
Potassium: 3.2 mEq/L — ABNORMAL LOW (ref 3.5–5.1)

## 2012-09-26 LAB — GLUCOSE, CAPILLARY
Glucose-Capillary: 151 mg/dL — ABNORMAL HIGH (ref 70–99)
Glucose-Capillary: 276 mg/dL — ABNORMAL HIGH (ref 70–99)
Glucose-Capillary: 186 mg/dL — ABNORMAL HIGH (ref 70–99)

## 2012-09-26 MED ORDER — POTASSIUM CHLORIDE CRYS ER 20 MEQ PO TBCR
40.0000 meq | EXTENDED_RELEASE_TABLET | Freq: Once | ORAL | Status: AC
  Administered 2012-09-26: 40 meq via ORAL

## 2012-09-26 MED ORDER — FUROSEMIDE 40 MG PO TABS
40.0000 mg | ORAL_TABLET | Freq: Two times a day (BID) | ORAL | Status: DC
  Administered 2012-09-26 – 2012-09-27 (×3): 40 mg via ORAL
  Filled 2012-09-26 (×5): qty 1

## 2012-09-26 NOTE — Clinical Social Work Placement (Addendum)
    Clinical Social Work Department CLINICAL SOCIAL WORK PLACEMENT NOTE 09/26/2012  Patient:  [name] [name], [name] [name]  Account Number:  [id number] Admit date:  09/22/2012  Clinical Social Worker:  Morton Stall, CLINICAL SOCIAL WORKER  Date/time:  09/26/2012 04:14 PM  Clinical Social Work is seeking post-discharge placement for this patient at the following level of care:   SKILLED NURSING   (*CSW will update this form in Epic as items are completed)   09/26/2012  Patient/family provided with [facility] Department of Clinical Social Work's list of facilities offering this level of care within the geographic area requested by the patient (or if unable, by the patient's family).  09/26/2012  Patient/family informed of their freedom to choose among providers that offer the needed level of care, that participate in Medicare, Medicaid or managed care program needed by the patient, have an available bed and are willing to accept the patient.  09/26/2012  Patient/family informed of [facility]' ownership interest in [facility], as well as of the fact that they are under no obligation to receive care at this facility.  PASARR submitted to EDS on 09/26/2012 PASARR number received from [employer] on 09/26/2012  FL2 transmitted to all facilities in geographic area requested by pt/family on  09/26/2012 FL2 transmitted to all facilities within larger geographic area on 09/26/2012  Patient informed that his/her managed care company has contracts with or will negotiate with  certain facilities, including the following:     Patient/family informed of bed offers received: 09/29/12  (DTC) Patient chooses bed at [facility]  (DTC) Physician recommends and patient chooses bed at    Patient to be transferred to [facility]   on  09/29/12  (DTC) Patient to be transferred to facility by Ambulance  [name])   (DTC)  The following physician request were entered in Epic:   Additional  Comments:  09/29/12: Patient is stable for d/c per MD.  Patient agrees to short term SNF as does his girlfriend [name] [name].  If SNF does not work out, Ms. [name] [name] states that she wants him to come home and receive either home health PT or she will take him to out patient physical Therapy.  [name]  [phone]

## 2012-09-26 NOTE — Progress Notes (Signed)
[facility] PROGRESS NOTE  [name] [name] RUE:[id number] DOB: Nov 17, 1921 DOA: 09/22/2012 PCP: Carollee Herter, MD  Assessment/Plan:  Acute hypoxic respiratory failure due to Healthcare-associated pneumonia/copd exacerbation/ Fluid overload:   - Shortness of breath with cough and a chest x-ray that shows bilateral upper lobes infiltrates. His white count is borderline high to this most likely healthcare associated pneumonia as he was in the hospital less than 90 days ago.  He was started on vancomycin zosyn and Levaquin, blood cultures and sputum cultures negative so far.    -  Lactic acid is normal, he was started on fluids on admission and on 2/8 patient was found to be in acute distress with bilateral rhonchi and basilar rales, requiring 6 liters of nasal oxygen with oxygen sats in upper 80's. He was given a doseof IV lasix and an ABG done, . He was hypoxemic on 5 lit [place] oxygen. BIpap was ordered and he was transferred to step down. He was started on IV bid Lasix - ordered 2 D echocardiogram, report showed good LVEF, with possibly diastolic dysfunction, but was limited evaluation because of atrial fibrillation, serial enzymes negative. Also check strict I.'s and O.'s and daily weights. - repeat cxr in am , if improving, can de escalate the antibiotics to levaquin in am. Also started weaning him off the oxygen to 3 lit today.   I/O last 3 completed shifts: In: 360 [P.O.:360] Out: 2325 [Urine:2325] Total I/O In: -  Out: 800 [Urine:500; Stool:300]       CKD (chronic kidney disease) stage 3, GFR 30-59 ml/min  - Creatinine is close to baseline.   - We'll go ahead and hold his ARB, was his blood pressures start trending up we can restarted.   Pacemaker-St.Jude, implanted June 2013 / Atrial Fibrillation:  - On Chronic anticoagulation, [name].and amiodarone. Rate is suboptimal today between 100 to 110.   H/O colostomy  - Stable no signs infection.   Hypertension:  - I  will go ahead and hold his ARB and calcium channel blocker., he is on 2 beta blockers I will hold atenolol continue the metoprolol, we'll go ahead and also continue the amiodarone.  Anemia: normocytic.  ? Diabetes Mellitus: SSI. HGba1c is 6.7 CBG (last 3)   Recent Labs  09/26/12 0752 09/26/12 1143 09/26/12 1718  GLUCAP 137* 151* 276*      Code Status: full code Family Communication: none at bedside Disposition Plan: SNF.      Antibiotics:  Vancomycin 2/7  Zosyn 2/7  levaquin 2/7  HPI/Subjective: Denies any pain, COMFORTABLE.   Objective: Filed Vitals:   09/26/12 1134 09/26/12 1551 09/26/12 1552 09/26/12 1603  BP: 120/60 118/67  132/70  Pulse: 70 70  86  Temp: 98.2 F (36.8 C)   98 F (36.7 C)  TempSrc: Oral   Oral  Resp: 16 16    Height:      Weight:      SpO2: 100% 100% 87% 100%    Intake/Output Summary (Last 24 hours) at 09/26/12 1752 Last data filed at 09/26/12 1039  Gross per 24 hour  Intake      0 ml  Output   1650 ml  Net  -1650 ml   Filed Weights   09/23/12 0504 09/24/12 0500 09/26/12 0400  Weight: 86.728 kg (191 lb 3.2 oz) 84 kg (185 lb 3 oz) 78 kg (171 lb 15.3 oz)    Exam:   General:  Alert afebrile comfortable, and on 3 lit of nasal [place] oxygen.  Cardiovascular: s1s2 RRR.  Respiratory: FAIR air entry bilateral, scattered rhonchi.   Abdomen: sof t NT ND BS+. Colostomy bag .  Data Reviewed: Basic Metabolic Panel:  Recent Labs Lab 09/22/12 1506 09/23/12 0604 09/24/12 0530 09/25/12 0410 09/26/12 0600  NA 140 144 143 140 142  K 3.5 3.9 3.4* 3.3* 3.2*  CL 104 109 99 96 98  CO2 23 26 28 30  35*  GLUCOSE 161* 223* 189* 255* 143*  BUN 22 21 30* 40* 42*  CREATININE 1.20 1.10 1.17 1.29 1.33  CALCIUM 9.2 8.9 9.2 8.8 8.8  MG  --   --   --  2.1  --    Liver Function Tests:  Recent Labs Lab 09/22/12 1506 09/23/12 0604  AST 47* 49*  ALT 41 49  ALKPHOS 81 82  BILITOT 0.6 0.5  PROT 6.6 6.1  ALBUMIN 2.7* 2.4*   No  results found for this basename: LIPASE, AMYLASE,  in the last 168 hours No results found for this basename: AMMONIA,  in the last 168 hours CBC:  Recent Labs Lab 09/22/12 1506 09/23/12 0604  WBC 10.1 8.7  NEUTROABS 8.3*  --   HGB 9.8* 9.3*  HCT 28.3* 27.3*  MCV 90.1 90.4  PLT 224 209   Cardiac Enzymes:  Recent Labs Lab 09/22/12 1517 09/23/12 1104 09/23/12 1640 09/23/12 2342  TROPONINI <0.30 <0.30 <0.30 <0.30   BNP (last 3 results)  Recent Labs  02/06/12 1416 09/23/12 1104 09/25/12 0410  PROBNP 3850.0* 4943.0* 8257.0*   CBG:  Recent Labs Lab 09/25/12 1546 09/25/12 2124 09/26/12 0752 09/26/12 1143 09/26/12 1718  GLUCAP 186* 248* 137* 151* 276*    Recent Results (from the past 240 hour(s))  CULTURE, BLOOD (ROUTINE X 2)     Status: None   Collection Time    09/22/12  5:10 PM      Result Value Range Status   Specimen Description BLOOD ARM LEFT   Final   Special Requests BOTTLES DRAWN AEROBIC AND ANAEROBIC 10CC   Final   Culture  Setup Time 09/22/2012 22:30   Final   Culture     Final   Value:        BLOOD CULTURE RECEIVED NO GROWTH TO DATE CULTURE WILL BE HELD FOR 5 DAYS BEFORE ISSUING A FINAL NEGATIVE REPORT   Report Status PENDING   Incomplete  CULTURE, BLOOD (ROUTINE X 2)     Status: None   Collection Time    09/22/12  5:20 PM      Result Value Range Status   Specimen Description BLOOD HAND RIGHT   Final   Special Requests BOTTLES DRAWN AEROBIC ONLY 5CC   Final   Culture  Setup Time 09/22/2012 22:31   Final   Culture     Final   Value:        BLOOD CULTURE RECEIVED NO GROWTH TO DATE CULTURE WILL BE HELD FOR 5 DAYS BEFORE ISSUING A FINAL NEGATIVE REPORT   Report Status PENDING   Incomplete  CULTURE, BLOOD (ROUTINE X 2)     Status: None   Collection Time    09/22/12  7:12 PM      Result Value Range Status   Specimen Description BLOOD RIGHT HAND   Final   Special Requests BOTTLES DRAWN AEROBIC ONLY 3CC   Final   Culture  Setup Time 09/22/2012 22:31    Final   Culture     Final   Value:        BLOOD  CULTURE RECEIVED NO GROWTH TO DATE CULTURE WILL BE HELD FOR 5 DAYS BEFORE ISSUING A FINAL NEGATIVE REPORT   Report Status PENDING   Incomplete  CULTURE, BLOOD (ROUTINE X 2)     Status: None   Collection Time    09/22/12  7:13 PM      Result Value Range Status   Specimen Description BLOOD LEFT ARM   Final   Special Requests     Final   Value: BOTTLES DRAWN AEROBIC AND ANAEROBIC 10CC AER,8CC ANA   Culture  Setup Time 09/22/2012 22:30   Final   Culture     Final   Value:        BLOOD CULTURE RECEIVED NO GROWTH TO DATE CULTURE WILL BE HELD FOR 5 DAYS BEFORE ISSUING A FINAL NEGATIVE REPORT   Report Status PENDING   Incomplete  MRSA PCR SCREENING     Status: None   Collection Time    09/22/12  8:35 PM      Result Value Range Status   MRSA by PCR NEGATIVE  NEGATIVE Final   Comment:            The GeneXpert MRSA Assay (FDA     approved for NASAL specimens     only), is one component of a     comprehensive MRSA colonization     surveillance program. It is not     intended to diagnose MRSA     infection nor to guide or     monitor treatment for     MRSA infections.     Studies: No results found.  Scheduled Meds: . amiodarone  400 mg Oral BID  . antiseptic oral rinse  15 mL Mouth Rinse BID  . apixaban  2.5 mg Oral BID  . ceFEPime (MAXIPIME) IV  1 g Intravenous Q24H  . digoxin  0.25 mg Oral Daily  . furosemide  40 mg Oral BID  . insulin aspart  0-15 Units Subcutaneous TID WC  . insulin aspart  0-5 Units Subcutaneous QHS  . levofloxacin (LEVAQUIN) IV  750 mg Intravenous Q48H  . metoprolol tartrate  25 mg Oral Q8H  . potassium chloride  40 mEq Oral Daily  . predniSONE  60 mg Oral QAC breakfast  . sodium chloride  3 mL Intravenous Q12H  . vancomycin  1,000 mg Intravenous Q24H   Continuous Infusions:   Principal Problem:   Respiratory failure, acute Active Problems:   HYPERLIPIDEMIA   Hypertension   H/O colostomy   CKD  (chronic kidney disease) stage 3, GFR 30-59 ml/min   PAF, was in SR 09/19/12 now A fib with RVR   Pacemaker-St.Jude, implanted June 2013   Chronic anticoagulation, [name]   Healthcare-associated pneumonia   Acute diastolic heart failure - due to PNA & Afib RVR    Time spent: 28 min    Benney [place]  [facility] Pager [phone]. If 8PM-8AM, please contact night-coverage at [email or web address], password [facility] 09/26/2012, 5:52 PM  LOS: 4 days

## 2012-09-26 NOTE — Progress Notes (Signed)
[facility]  Subjective: Slept well and breathing better.  No orthopnea  Objective: Vital signs in last 24 hours: Temp:  [97.4 F (36.3 C)-98.4 F (36.9 C)] 98.2 F (36.8 C) (02/11 0754) Pulse Rate:  [68-93] 71 (02/11 0754) Resp:  [14-27] 17 (02/11 0754) BP: (120-156)/(51-83) 154/67 mmHg (02/11 0754) SpO2:  [86 %-99 %] 96 % (02/11 0754) Weight:  [78 kg (171 lb 15.3 oz)] 78 kg (171 lb 15.3 oz) (02/11 0400) Last BM Date: 09/25/12  Intake/Output from previous day: 02/10 0701 - 02/11 0700 In: 360 [P.O.:360] Out: 1775 [Urine:1775] Intake/Output this shift: Total I/O In: -  Out: 300 [Urine:300]  Medications Current Facility-Administered Medications  Medication Dose Route Frequency Provider Last Rate Last Dose  . acetaminophen (TYLENOL) tablet 650 mg  650 mg Oral Q6H PRN Marinda Elk, MD       Or  . acetaminophen (TYLENOL) suppository 650 mg  650 mg Rectal Q6H PRN Marinda Elk, MD      . albuterol (PROVENTIL) (5 MG/ML) 0.5% nebulizer solution 2.5 mg  2.5 mg Nebulization Q2H PRN Marinda Elk, MD   2.5 mg at 09/22/12 2109  . amiodarone (PACERONE) tablet 400 mg  400 mg Oral BID Nada Boozer, NP   400 mg at 09/25/12 2315  . antiseptic oral rinse (BIOTENE) solution 15 mL  15 mL Mouth Rinse BID Marinda Elk, MD   15 mL at 09/25/12 2316  . apixaban (ELIQUIS) tablet 2.5 mg  2.5 mg Oral BID Marinda Elk, MD   2.5 mg at 09/25/12 2316  . ceFEPIme (MAXIPIME) 1 g in dextrose 5 % 50 mL IVPB  1 g Intravenous Q24H Christella Hartigan, PHARMD   1 g at 09/25/12 2358  . digoxin (LANOXIN) tablet 0.25 mg  0.25 mg Oral Daily Nada Boozer, NP   0.25 mg at 09/25/12 0912  . furosemide (LASIX) injection 40 mg  40 mg Intravenous BID Kathlen Mody, MD   40 mg at 09/25/12 1723  . haloperidol lactate (HALDOL) injection 1 mg  1 mg Intravenous Q6H PRN Marinda Elk, MD      . insulin aspart (novoLOG) injection 0-15 Units  0-15 Units Subcutaneous TID  WC Kathlen Mody, MD   3 Units at 09/25/12 1559  . insulin aspart (novoLOG) injection 0-5 Units  0-5 Units Subcutaneous QHS Kathlen Mody, MD   2 Units at 09/25/12 2315  . levofloxacin (LEVAQUIN) IVPB 750 mg  750 mg Intravenous Q48H Meera K Patel, PHARMD   750 mg at 09/24/12 1727  . metoprolol tartrate (LOPRESSOR) tablet 25 mg  25 mg Oral Q8H Nada Boozer, NP   25 mg at 09/26/12 0236  . ondansetron (ZOFRAN) tablet 4 mg  4 mg Oral Q6H PRN Marinda Elk, MD       Or  . ondansetron [facility]) injection 4 mg  4 mg Intravenous Q6H PRN Marinda Elk, MD      . polyethylene glycol [facility]) packet 17 g  17 g Oral Daily PRN Marinda Elk, MD      . potassium chloride SA (K-DUR,KLOR-CON) CR tablet 40 mEq  40 mEq Oral Daily Kathlen Mody, MD   40 mEq at 09/25/12 0913  . predniSONE (DELTASONE) tablet 60 mg  60 mg Oral QAC breakfast Kathlen Mody, MD   60 mg at 09/25/12 1610  . sodium chloride 0.9 % injection 3 mL  3 mL Intravenous Q12H Marinda Elk, MD   3  mL at 09/25/12 2317  . vancomycin (VANCOCIN) IVPB 1000 mg/200 mL premix  1,000 mg Intravenous Q24H Kathlen Mody, MD        PE: General appearance: alert, cooperative and no distress Lungs: Mild basilar rales. Heart: irregularly irregular rhythm and 1/6 Sys MM at LSB Extremities: No LEE Pulses: 2+ and symmetric Skin: Warm and dry Neurologic: Grossly normal  Lab Results:  No results found for this basename: WBC, HGB, HCT, PLT,  in the last 72 hours BMET  Recent Labs  09/24/12 0530 09/25/12 0410 09/26/12 0600  NA 143 140 142  K 3.4* 3.3* 3.2*  CL 99 96 98  CO2 28 30 35*  GLUCOSE 189* 255* 143*  BUN 30* 40* 42*  CREATININE 1.17 1.29 1.33  CALCIUM 9.2 8.8 8.8   BNP (last 3 results)  Recent Labs  02/06/12 1416 09/23/12 1104 09/25/12 0410  PROBNP 3850.0* 4943.0* 8257.0*    Assessment/Plan   Principal Problem:   Respiratory failure, acute Active Problems:   HYPERLIPIDEMIA   Hypertension   H/O  colostomy   CKD (chronic kidney disease) stage 3, GFR 30-59 ml/min   PAF, was in SR 09/19/12 now A fib with RVR   Pacemaker-St.Jude, implanted June 2013   Chronic anticoagulation, [name]   Healthcare-associated pneumonia   Acute diastolic heart failure - due to PNA & Afib RVR  Plan:  V pacing on telemetry with underlying Afib.  Rate well controlled.  Net fluids -2.4L in the last 48hrs and -1.4L in the last 24.  Replace K.  SCr stable and WNL.  BNP increasing but I do not think it represents his true fluid status. Change to PO lasix today.  Ambulate.  PT working with pt today.     LOS: 4 days    HAGER, BRYAN 09/26/2012 7:58 AM   Agree with note written by [facility]  Admitted with acute on chronic CHF with preserved LV function. PAF/PTVPM on amio load and OAC. Diuresing. BNP rising and CXR shows interstitial edema. On ATBX as well. Agree with Dr. Salena Saner that he will probably convert pharmacologically once diuresed and any superimposed infn clears. Can be transferred to tele. Change to PO diuretics. Ambulate. Prob home later this week. Will need HHC.   Runell Gess 09/26/2012 8:14 AM

## 2012-09-26 NOTE — Progress Notes (Signed)
Occupational Therapy Evaluation Patient Details Name: [name] [name] MRN: [id number] DOB: 1921/08/31 Today's Date: 09/26/2012 Time: 9528-4132 OT Time Calculation (min): 25 min  OT Assessment / Plan / Recommendation Clinical Impression  79 yo admitted with SOB.Pt with PN and respiratory failure. PTA, pt lived independetly with girlfriend.Pt unable to D/C home safely at this time. Pt desat to 87 3LO2 after standing <2 min while washing hands. dyspnea 2/4. Pt will benefit from OT to maximize independence with faciiltate D/C to next venue of care. Pt will benefit from rehab at SNF, however, if pt begins to progress, he may be able to D/C home with 24/7 assistance of girlfriend.     OT Assessment  Patient needs continued OT Services    Follow Up Recommendations  Home health OT;SNF    Barriers to Discharge Other (comment) (unsure how muchgirlfriend canassist)    Equipment Recommendations  None recommended by OT    Recommendations for Other Services  none  Frequency  Min 3X/week    Precautions / Restrictions Precautions Precautions: Fall Precaution Comments: watch O2 sats   Pertinent Vitals/Pain No pain. See vitals    ADL  Eating/Feeding: Independent Where Assessed - Eating/Feeding: Chair Grooming: Supervision/safety;Set up Where Assessed - Grooming: Supported standing Upper Body Bathing: Supervision/safety;Set up Where Assessed - Upper Body Bathing: Supported sitting Lower Body Bathing: Moderate assistance Where Assessed - Lower Body Bathing: Supported sit to stand Upper Body Dressing: Minimal assistance Where Assessed - Upper Body Dressing: Supported sitting Lower Body Dressing: Moderate assistance Where Assessed - Lower Body Dressing: Supported sit to Pharmacist, hospital: Minimal Dentist Method: Sit to Barista: Other (comment) (bed - chair) Toileting - Clothing Manipulation and Hygiene: Supervision/safety Where Assessed -  Toileting Clothing Manipulation and Hygiene: Sit to stand from 3-in-1 or toilet Equipment Used: Gait belt Transfers/Ambulation Related to ADLs: minA ADL Comments: lmited by poor endurance    OT Diagnosis: Generalized weakness  OT Problem List: Decreased strength;Decreased activity tolerance;Decreased knowledge of use of DME or AE;Cardiopulmonary status limiting activity OT Treatment Interventions: Self-care/ADL training;Therapeutic exercise;DME and/or AE instruction;Therapeutic activities;Patient/family education   OT Goals Acute Rehab OT Goals OT Goal Formulation: With patient Time For Goal Achievement: 10/10/12 Potential to Achieve Goals: Good ADL Goals Pt Will Perform Grooming: with supervision;Standing at sink;Other (comment) (O2sats >90) ADL Goal: Grooming - Progress: Goal set today Pt Will Perform Lower Body Bathing: with set-up;with supervision;Sit to stand from chair;with adaptive equipment;with cueing (comment type and amount) ADL Goal: Lower Body Bathing - Progress: Goal set today Pt Will Perform Lower Body Dressing: with set-up;with supervision;Sit to stand from chair;Unsupported;with cueing (comment type and amount) ADL Goal: Lower Body Dressing - Progress: Goal set today Pt Will Transfer to Toilet: with supervision;3-in-1 ADL Goal: Toilet Transfer - Progress: Goal set today Pt Will Perform Toileting - Clothing Manipulation: with modified independence;Standing ADL Goal: Toileting - Clothing Manipulation - Progress: Goal set today Pt Will Perform Toileting - Hygiene: with modified independence;Standing at 3-in-1/toilet ADL Goal: Toileting - Hygiene - Progress: Goal set today Additional ADL Goal #1: Ptwill verbalize 3 E consrvation techniquesfor ADL ADL Goal: Additional Goal #1 - Progress: Goal set today  Visit Information  Last OT Received On: 09/26/12 Assistance Needed: +1    Subjective Data      Prior Functioning     Home Living Lives With: Significant  other Available Help at Discharge: Family;Available PRN/intermittently Type of Home: House Home Access: Stairs to enter [employer] of Steps: 1 Entrance Stairs-Rails:  None Home Layout: One level Bathroom Toilet: Standard Bathroom Accessibility: Yes How Accessible: Accessible via walker Home Adaptive Equipment: Straight cane;Shower chair with back Prior Function Level of Independence: Needs assistance Needs Assistance: Light Housekeeping;Meal Prep Meal Prep: Moderate Light Housekeeping: Maximal Able to Take Stairs?: Yes Driving: Yes Vocation: Retired Musician: HOH;Other (comment) (bil hearing aids)         Vision/Perception     Cognition  Cognition Overall Cognitive Status: Appears within functional limits for tasks assessed/performed Arousal/Alertness: Awake/alert Orientation Level: Appears intact for tasks assessed Behavior During Session: [facility] for tasks performed    Extremity/Trunk Assessment Right Upper Extremity Assessment RUE ROM/Strength/Tone: [facility] for tasks assessed Left Upper Extremity Assessment LUE ROM/Strength/Tone: WFL for tasks assessed Right Lower Extremity Assessment RLE ROM/Strength/Tone: [facility] for tasks assessed Left Lower Extremity Assessment LLE ROM/Strength/Tone: WFL for tasks assessed Trunk Assessment Trunk Assessment: Normal     Mobility Bed Mobility Bed Mobility: Not assessed Transfers Transfers: Sit to Stand;Stand to Sit Sit to Stand: 4: Min assist Stand to Sit: To chair/3-in-1;With upper extremity assist;4: Min assist (forcontrolled descent)     Exercise     Balance Static Sitting Balance Static Sitting - Balance Support: No upper extremity supported;Feet supported Static Sitting - Level of Assistance: 6: Modified independent (Device/Increase time) Dynamic Standing Balance Dynamic Standing - Balance Support: Left upper extremity supported Dynamic Standing - Level of Assistance: 4: Min assist Dynamic  Standing - Balance Activities: Other (comment) (grooming)   End of Session OT - End of Session Equipment Utilized During Treatment: Gait belt Activity Tolerance: Patient limited by fatigue Patient left: in chair;with call bell/phone within reach Nurse Communication: Mobility status  GO     [name] [name],[name] [name] 09/26/2012, 4:06 PM [facility], OTR/L  [phone] 09/26/2012

## 2012-09-26 NOTE — Progress Notes (Signed)
Report called to [facility] RN to tx to [phone]. Pt transported by Whitney NT and Philinda NT.

## 2012-09-26 NOTE — Progress Notes (Signed)
Physical Therapy Treatment Patient Details Name: [name] [name] MRN: [id number] DOB: 05/21/1922 Today's Date: 09/26/2012 Time: 7253-6644 PT Time Calculation (min): 24 min  PT Assessment / Plan / Recommendation Comments on Treatment Session  Pt admitted with PNA, SOB, respiratory failure and not progressing with mobility at this point due to cardiopulmonary status. If pt unable to progress with mobility to supervision level may need to consider ST-SNF prior to return home, discussed with pt who agreed if needed pending LOS. Pt encouraged to increase mobility with nursing, continue HEP and work on pursed lip breathing.     Follow Up Recommendations  SNF;Home health PT;Supervision for mobility/OOB (pending progression)     Does the patient have the potential to tolerate intense rehabilitation     Barriers to Discharge        Equipment Recommendations       Recommendations for Other Services    Frequency     Plan Discharge plan needs to be updated;Frequency remains appropriate    Precautions / Restrictions Precautions Precautions: Fall Precaution Comments: watch O2 sats   Pertinent Vitals/Pain No pain sats dropping to 80% with 75'ambulation, 90% on return to sitting and up to 96% on 6L after 2 min rest    Mobility  Bed Mobility Bed Mobility: Rolling Right;Right Sidelying to Sit Rolling Right: 5: Supervision;With rail Right Sidelying to Sit: 5: Supervision;HOB elevated;With rails (HOB 20degrees) Details for Bed Mobility Assistance: cueing for sequence with use of rails and increased time to complete Transfers Sit to Stand: 4: Min guard;From bed Stand to Sit: 4: Min assist;To chair/3-in-1 Details for Transfer Assistance: cueing for hand placement with standing and assist to control descent to chair due to pt rushing to surface with fatigue Ambulation/Gait Ambulation/Gait Assistance: 4: Min assist Ambulation Distance (Feet): 75 Feet Assistive device: Rolling  walker Ambulation/Gait Assistance Details: cueing for posture and position in RW. Pt maintained sats 90% on 1st half of ambulation at 6L but then sats started dropping to 80% and pt became anxious and fatigued and max cueing for pt to take rest breaks, work on breathing technique, and stay in RW. Pt pushing walker off to left side and walking too quickly for sats despite cueing increased assist on return to room for pt safety.  Gait Pattern: Step-through pattern;Decreased stride length Gait velocity: too quick for cardiopulmonary status Stairs: No    Exercises General Exercises - Lower Extremity Ankle Circles/Pumps: AROM;Both;20 reps;Seated Long Arc Quad: AROM;Both;15 reps;Seated Hip Flexion/Marching: AROM;Both;15 reps;Seated   PT Diagnosis:    PT Problem List:   PT Treatment Interventions:     PT Goals Acute Rehab PT Goals PT Goal: Supine/Side to Sit - Progress: Progressing toward goal PT Goal: Sit to Stand - Progress: Progressing toward goal PT Goal: Stand to Sit - Progress: Progressing toward goal PT Goal: Ambulate - Progress: Progressing toward goal  Visit Information  Last PT Received On: 09/26/12 Assistance Needed: +1    Subjective Data  Subjective: I'm just so tired   Cognition  Cognition Overall Cognitive Status: Appears within functional limits for tasks assessed/performed Arousal/Alertness: Awake/alert Orientation Level: Appears intact for tasks assessed Behavior During Session: [facility] for tasks performed    Balance     End of Session PT - End of Session Equipment Utilized During Treatment: Gait belt;Oxygen Activity Tolerance: Patient limited by fatigue Patient left: in chair;with call bell/phone within reach Nurse Communication: Mobility status   GP     Toney Sang [facility] 09/26/2012, 9:19 AM Delaney Meigs, PT  [phone]   

## 2012-09-26 NOTE — Clinical Social Work Psychosocial (Signed)
     Clinical Social Work Department BRIEF PSYCHOSOCIAL ASSESSMENT 09/26/2012  Patient:  [name] [name], [name] [name]     Account Number:  [id number]     Admit date:  09/22/2012  Clinical Social Worker:  Morton Stall, CLINICAL SOCIAL WORKER  Date/Time:  09/26/2012 03:56 AM  Referred by:  Physician  Date Referred:  09/26/2012 Referred for  SNF Placement   Other Referral:   Interview type:  Patient Other interview type:    PSYCHOSOCIAL DATA Living Status:  FRIEND(S) Admitted from facility:   Level of care:   Primary support name:  Maryan Rued Primary support relationship to patient:  FRIEND Degree of support available:   unknown    CURRENT CONCERNS Current Concerns  Post-Acute Placement   Other Concerns:    SOCIAL WORK ASSESSMENT / PLAN Clinical social work intern spoke with pt about SNF as a back up plan after discharge. CSW intern explained what a SNF was to the pt. CSW explained that this was a back up if he did not return home with home health.  CSW intern left a list of facilities with pt. Pt was agreeable to faxing out to get bed offers. CSW intern will fax out pt to SNF. CSW to follow up as needed.   Assessment/plan status:  Information/Referral to [facility] Other assessment/ plan:   Information/referral to community resources:   Information about SNF.    PATIENTS/FAMILYS RESPONSE TO PLAN OF CARE: Pt was receptive to information about SNF. Pt was agreeable to Clinical social work Land him out to facilities. Pt thanked English as a second language teacher.

## 2012-09-27 ENCOUNTER — Encounter (HOSPITAL_COMMUNITY): Payer: Self-pay | Admitting: Cardiology

## 2012-09-27 ENCOUNTER — Inpatient Hospital Stay (HOSPITAL_COMMUNITY): Payer: Medicare Other

## 2012-09-27 LAB — CBC
HCT: 34.7 % — ABNORMAL LOW (ref 39.0–52.0)
Platelets: 248 10*3/uL (ref 150–400)
RDW: 14.5 % (ref 11.5–15.5)
WBC: 12.7 10*3/uL — ABNORMAL HIGH (ref 4.0–10.5)

## 2012-09-27 LAB — GLUCOSE, CAPILLARY
Glucose-Capillary: 187 mg/dL — ABNORMAL HIGH (ref 70–99)
Glucose-Capillary: 212 mg/dL — ABNORMAL HIGH (ref 70–99)

## 2012-09-27 LAB — BASIC METABOLIC PANEL
CO2: 33 mEq/L — ABNORMAL HIGH (ref 19–32)
Calcium: 8.8 mg/dL (ref 8.4–10.5)
GFR calc Af Amer: 42 mL/min — ABNORMAL LOW (ref >90)
GFR calc non Af Amer: 36 mL/min — ABNORMAL LOW (ref >90)
Sodium: 142 mEq/L (ref 135–145)

## 2012-09-27 LAB — MAGNESIUM: Magnesium: 2.2 mg/dL (ref 1.5–2.5)

## 2012-09-27 LAB — PRO B NATRIURETIC PEPTIDE: Pro B Natriuretic peptide (BNP): 5061 pg/mL — ABNORMAL HIGH (ref 0–450)

## 2012-09-27 MED ORDER — DIGOXIN 125 MCG PO TABS
0.1250 mg | ORAL_TABLET | Freq: Every day | ORAL | Status: DC
  Administered 2012-09-28 – 2012-09-29 (×2): 0.125 mg via ORAL
  Filled 2012-09-27 (×2): qty 1

## 2012-09-27 MED ORDER — POTASSIUM CHLORIDE CRYS ER 20 MEQ PO TBCR
20.0000 meq | EXTENDED_RELEASE_TABLET | Freq: Every day | ORAL | Status: DC
  Administered 2012-09-28 – 2012-09-29 (×2): 20 meq via ORAL
  Filled 2012-09-27 (×2): qty 1

## 2012-09-27 MED ORDER — FUROSEMIDE 40 MG PO TABS
40.0000 mg | ORAL_TABLET | Freq: Every day | ORAL | Status: DC
  Administered 2012-09-28 – 2012-09-29 (×2): 40 mg via ORAL
  Filled 2012-09-27 (×2): qty 1

## 2012-09-27 NOTE — Progress Notes (Signed)
Pt. Seen and examined. Agree with the NP/PA-C note as written.  Has diuresed well, out almost 5L. Agree to continue with amiodarone loading. Decrease lasix - creatinine rose acutely today, may be at diuresis endpoint.  Chrystie Nose, MD, [facility] Attending Cardiologist [facility]

## 2012-09-27 NOTE — Progress Notes (Signed)
[facility] PROGRESS NOTE  [name] [name] BJY:[id number] DOB: August 28, 1921 DOA: 09/22/2012 PCP: Carollee Herter, MD  Assessment/Plan:  Acute hypoxic respiratory failure due to Healthcare-associated pneumonia/copd exacerbation/ Fluid overload:   - Improved. -CWR with improved pulmonary infiltrates. -  Has received 5 days of HCAP treatment; will DC vanc/cefepime and keep him on levaquin only for an additional 5 days. -Is 4.8L negative since admission. -Cardiology is adjusting diuretic dose.  I/O last 3 completed shifts: In: 1010 [P.O.:960; IV Piggyback:50] Out: 2250 [Urine:1950; Stool:300] Total I/O In: -  Out: 380 [Urine:380]    CKD (chronic kidney disease) stage 3, GFR 30-59 ml/min  - Creatinine is a little worse today. -Agree with backing off on lasix a little. - ARB is on hold due to worsening renal failure.  Pacemaker-St.Jude, implanted June 2013 / Atrial Fibrillation:  - On Chronic anticoagulation, [name].and amiodarone.  -Cards is following.  H/O colostomy  - Stable no signs infection.   Hypertension:  - I will go ahead and hold his ARB and calcium channel blocker., he is on 2 beta blockers I will hold atenolol continue the metoprolol, we'll go ahead and also continue the amiodarone.  Anemia: normocytic. Stable. No need for transfusion at this point. Hb 11.7.   Diabetes Mellitus: SSI. HGba1c is 6.7 CBG (last 3)   Recent Labs  09/26/12 1718 09/26/12 2125 09/27/12 0643  GLUCAP 276* 186* 161*      Code Status: full code Family Communication: none at bedside Disposition Plan: SNF. Will alert SW.      HPI/Subjective: Denies any pain, COMFORTABLE.   Objective: Filed Vitals:   09/27/12 0148 09/27/12 0439 09/27/12 1011 09/27/12 1012  BP: 120/56 142/73  148/72  Pulse: 69 70 78 78  Temp: 97.5 F (36.4 C) 97.9 F (36.6 C)    TempSrc: Oral Oral    Resp:  24    Height:      Weight:  79.2 kg (174 lb 9.7 oz)    SpO2: 91% 92%       Intake/Output Summary (Last 24 hours) at 09/27/12 1240 Last data filed at 09/27/12 0800  Gross per 24 hour  Intake    530 ml  Output    980 ml  Net   -450 ml   Filed Weights   09/26/12 0400 09/26/12 2253 09/27/12 0439  Weight: 78 kg (171 lb 15.3 oz) 79.6 kg (175 lb 7.8 oz) 79.2 kg (174 lb 9.7 oz)    Exam:   General:  Alert afebrile comfortable, and on 3 lit of nasal canula oxygen.   Cardiovascular: s1s2 RRR.  Respiratory: FAIR air entry bilateral, scattered rhonchi.   Abdomen: sof t NT ND BS+. Colostomy bag .  Data Reviewed: Basic Metabolic Panel:  Recent Labs Lab 09/23/12 0604 09/24/12 0530 09/25/12 0410 09/26/12 0600 09/27/12 0610  NA 144 143 140 142 142  K 3.9 3.4* 3.3* 3.2* 3.9  CL 109 99 96 98 98  CO2 26 28 30  35* 33*  GLUCOSE 223* 189* 255* 143* 158*  BUN 21 30* 40* 42* 49*  CREATININE 1.10 1.17 1.29 1.33 1.63*  CALCIUM 8.9 9.2 8.8 8.8 8.8  MG  --   --  2.1  --  2.2   Liver Function Tests:  Recent Labs Lab 09/22/12 1506 09/23/12 0604  AST 47* 49*  ALT 41 49  ALKPHOS 81 82  BILITOT 0.6 0.5  PROT 6.6 6.1  ALBUMIN 2.7* 2.4*   No results found for this basename: LIPASE, AMYLASE,  in  the last 168 hours No results found for this basename: AMMONIA,  in the last 168 hours CBC:  Recent Labs Lab 09/22/12 1506 09/23/12 0604 09/27/12 0610  WBC 10.1 8.7 12.7*  NEUTROABS 8.3*  --   --   HGB 9.8* 9.3* 11.7*  HCT 28.3* 27.3* 34.7*  MCV 90.1 90.4 88.7  PLT 224 209 248   Cardiac Enzymes:  Recent Labs Lab 09/22/12 1517 09/23/12 1104 09/23/12 1640 09/23/12 2342  TROPONINI <0.30 <0.30 <0.30 <0.30   BNP (last 3 results)  Recent Labs  09/23/12 1104 09/25/12 0410 09/27/12 0610  PROBNP 4943.0* 8257.0* 5061.0*   CBG:  Recent Labs Lab 09/26/12 0752 09/26/12 1143 09/26/12 1718 09/26/12 2125 09/27/12 0643  GLUCAP 137* 151* 276* 186* 161*    Recent Results (from the past 240 hour(s))  CULTURE, BLOOD (ROUTINE X 2)     Status: None    Collection Time    09/22/12  5:10 PM      Result Value Range Status   Specimen Description BLOOD ARM LEFT   Final   Special Requests BOTTLES DRAWN AEROBIC AND ANAEROBIC 10CC   Final   Culture  Setup Time 09/22/2012 22:30   Final   Culture     Final   Value:        BLOOD CULTURE RECEIVED NO GROWTH TO DATE CULTURE WILL BE HELD FOR 5 DAYS BEFORE ISSUING A FINAL NEGATIVE REPORT   Report Status PENDING   Incomplete  CULTURE, BLOOD (ROUTINE X 2)     Status: None   Collection Time    09/22/12  5:20 PM      Result Value Range Status   Specimen Description BLOOD HAND RIGHT   Final   Special Requests BOTTLES DRAWN AEROBIC ONLY 5CC   Final   Culture  Setup Time 09/22/2012 22:31   Final   Culture     Final   Value:        BLOOD CULTURE RECEIVED NO GROWTH TO DATE CULTURE WILL BE HELD FOR 5 DAYS BEFORE ISSUING A FINAL NEGATIVE REPORT   Report Status PENDING   Incomplete  CULTURE, BLOOD (ROUTINE X 2)     Status: None   Collection Time    09/22/12  7:12 PM      Result Value Range Status   Specimen Description BLOOD RIGHT HAND   Final   Special Requests BOTTLES DRAWN AEROBIC ONLY 3CC   Final   Culture  Setup Time 09/22/2012 22:31   Final   Culture     Final   Value:        BLOOD CULTURE RECEIVED NO GROWTH TO DATE CULTURE WILL BE HELD FOR 5 DAYS BEFORE ISSUING A FINAL NEGATIVE REPORT   Report Status PENDING   Incomplete  CULTURE, BLOOD (ROUTINE X 2)     Status: None   Collection Time    09/22/12  7:13 PM      Result Value Range Status   Specimen Description BLOOD LEFT ARM   Final   Special Requests     Final   Value: BOTTLES DRAWN AEROBIC AND ANAEROBIC 10CC AER,8CC ANA   Culture  Setup Time 09/22/2012 22:30   Final   Culture     Final   Value:        BLOOD CULTURE RECEIVED NO GROWTH TO DATE CULTURE WILL BE HELD FOR 5 DAYS BEFORE ISSUING A FINAL NEGATIVE REPORT   Report Status PENDING   Incomplete  MRSA PCR SCREENING  Status: None   Collection Time    09/22/12  8:35 PM      Result Value  Range Status   MRSA by PCR NEGATIVE  NEGATIVE Final   Comment:            The GeneXpert MRSA Assay (FDA     approved for NASAL specimens     only), is one component of a     comprehensive MRSA colonization     surveillance program. It is not     intended to diagnose MRSA     infection nor to guide or     monitor treatment for     MRSA infections.     Studies: Dg Chest 2 View  09/27/2012  *RADIOLOGY REPORT*  Clinical Data: Weakness, pacemaker, fall wrist, pneumonia  CHEST - 2 VIEW  Comparison: 09/24/2012  Findings: Left subclavian sequential transvenous pacemaker leads project at right atrium and right ventricle. Enlargement of cardiac silhouette. Calcified tortuous aorta. Pulmonary vascularity grossly normal. Improved pulmonary infiltrates. Tiny right pleural effusion. No pneumothorax.  IMPRESSION: Improved pulmonary infiltrates.   Original Report Authenticated By: Ulyses Southward, M.D.     Scheduled Meds: . amiodarone  400 mg Oral BID  . antiseptic oral rinse  15 mL Mouth Rinse BID  . apixaban  2.5 mg Oral BID  . [START ON 09/28/2012] digoxin  0.125 mg Oral Daily  . [START ON 09/28/2012] furosemide  40 mg Oral Daily  . insulin aspart  0-15 Units Subcutaneous TID WC  . insulin aspart  0-5 Units Subcutaneous QHS  . levofloxacin (LEVAQUIN) IV  750 mg Intravenous Q48H  . metoprolol tartrate  25 mg Oral Q8H  . [START ON 09/28/2012] potassium chloride  20 mEq Oral Daily  . sodium chloride  3 mL Intravenous Q12H   Continuous Infusions:   Principal Problem:   Respiratory failure, acute Active Problems:   Colon cancer, remote colostomy   HYPERLIPIDEMIA   HTN (hypertension)   Hypertension   BPH (benign prostatic hyperplasia)   Non Hodgkin's lymphoma, Rx'd with chemotherapy '03   AAA recent Abd [place] "[place]"   CKD (chronic kidney disease) stage 3, GFR 30-59 ml/min   PAF, was in SR 09/19/12 now A fib with RVR   Pacemaker-St.Jude, implanted June 2013   Chronic anticoagulation, [name]    Healthcare-associated pneumonia   Acute diastolic heart failure - due to PNA & Afib RVR    Time spent: 35 min    HERNANDEZ ACOSTA,ESTELA  [facility] Pager [phone]. If 8PM-8AM, please contact night-coverage at [email or web address], password [email or web address] 09/27/2012, 12:40 PM  LOS: 5 days

## 2012-09-27 NOTE — Progress Notes (Signed)
Patient admitted on 2.7.14 for increasing SOB.  Noted history of AFIB and edema.  Patient weighed 191lbs on 2.8.14 and weighs 174 today.  He does not weigh at home.  Admission assessment notes that patient was using his live in caregivers oxygen for SOB over the last two weeks prior to admission. Patient evaluated for community based chronic disease management services with [facility] Care Management Program as a benefit of patient's [employer].  Spoke with patient (who is moderately hard of hearing) and his primary caregiver at bedside to explain [facility] Care Management services.  Patient has some apparent difficulty following conversation.  Caregiver frequently answers questions directed to patient.  Patient would like to review the program literature with his caregiver before signing consents.  Left contact information and [facility] literature with patient.  Patient expressed that he would like to ensure that his caregiver has health care POA status.  He understands that short term SNF rehabilitation may be necessary.  [facility] prefers [facility] if possible.  Instructed patient to discuss these requests with his inpatient Care Manager.  Notified inpatient care manager, Ivonne Andrew RN, of request.  Will continue to monitor patients disposition.  Of note, [facility] Care Management services does not replace or interfere with any services that are arranged by inpatient case management or social work.  For additional questions or referrals please contact Anibal Henderson BSN RN [facility] Liaison at [phone].

## 2012-09-27 NOTE — Progress Notes (Signed)
Subjective:  SOB improved  Objective:  Vital Signs in the last 24 hours: Temp:  [97.5 F (36.4 C)-98 F (36.7 C)] 97.9 F (36.6 C) (02/12 0439) Pulse Rate:  [68-116] 78 (02/12 1012) Resp:  [16-26] 24 (02/12 0439) BP: (101-148)/(51-89) 148/72 mmHg (02/12 1012) SpO2:  [87 %-100 %] 92 % (02/12 0439) Weight:  [79.2 kg (174 lb 9.7 oz)-79.6 kg (175 lb 7.8 oz)] 79.2 kg (174 lb 9.7 oz) (02/12 0439)  Intake/Output from previous day:  Intake/Output Summary (Last 24 hours) at 09/27/12 1201 Last data filed at 09/27/12 0800  Gross per 24 hour  Intake    530 ml  Output    980 ml  Net   -450 ml    Physical Exam: General appearance: alert, cooperative, no distress and pale Lungs: decreased breath sounds at bases Heart: regular rate and rhythm and 1/6 systolic murmur Abdomen: colostomy   Rate: 70  Rhythm: paced  Lab Results:  Recent Labs  09/27/12 0610  WBC 12.7*  HGB 11.7*  PLT 248    Recent Labs  09/26/12 0600 09/27/12 0610  NA 142 142  K 3.2* 3.9  CL 98 98  CO2 35* 33*  GLUCOSE 143* 158*  BUN 42* 49*  CREATININE 1.33 1.63*   No results found for this basename: TROPONINI, CK, MB,  in the last 72 hours Hepatic Function Panel No results found for this basename: PROT, ALBUMIN, AST, ALT, ALKPHOS, BILITOT, BILIDIR, IBILI,  in the last 72 hours No results found for this basename: CHOL,  in the last 72 hours No results found for this basename: INR,  in the last 72 hours  Imaging: Imaging results have been reviewed  Cardiac Studies:  2D  Left ventricle: The cavity size was normal. Wall thickness was increased in a pattern of mild LVH. Systolic function was normal. The estimated ejection fraction was in the range of 55% to 60%. Although no diagnostic regional wall motion abnormality was identified, this possibility cannot be completely excluded on the basis of this study. Atrial fibrillation limits evaluation of LV diastolic function, but E/Ea ratio suggests increased  left atrial pressure.. - Ventricular septum: Septal motion showed paradox. - Aortic valve: There was mild to moderate stenosis. Trivial regurgitation. Valve area: 1.47cm^2(VTI). Valve area: 1.33cm^2 (Vmax). - Mitral valve: Calcified annulus. Mild to moderate regurgitation directed centrally. Valve area by continuity equation (using LVOT flow): 1.31cm^2. - Left atrium: The atrium was mildly dilated. - Right atrium: The atrium was mildly dilated. - Atrial septum: No defect or patent foramen ovale was identified. - Pulmonary arteries: Systolic pressure was mildly increased. PA peak pressure: 39mm Hg (   Assessment/Plan:   Principal Problem:   Respiratory failure, acute Active Problems:   Acute diastolic heart failure - due to PNA & Afib RVR   CKD (chronic kidney disease) stage 3, GFR 30-59 ml/min   PAF, was in SR 09/19/12 now A fib with RVR   Pacemaker-St.Jude, implanted June 2013   Chronic anticoagulation, [name]   Healthcare-associated pneumonia   Colon cancer, remote colostomy   HYPERLIPIDEMIA   HTN (hypertension)   BPH (benign prostatic hyperplasia)   Non Hodgkin's lymphoma, Rx'd with chemotherapy '03   AAA recent Abd [place] "[place]"   Hypertension   Plan- By renal function it appears he has reached his dry wgt. He apparently was not on a diuretic at home, will decrease to Lasix 40mg  daily. He probably needs another 24-48hrs. MD to see. Continue Amiodarone loading. Decrease Lanoxin and K+.  Corine Shelter PA-C 09/27/2012, 12:01 PM

## 2012-09-28 LAB — GLUCOSE, CAPILLARY
Glucose-Capillary: 155 mg/dL — ABNORMAL HIGH (ref 70–99)
Glucose-Capillary: 134 mg/dL — ABNORMAL HIGH (ref 70–99)
Glucose-Capillary: 122 mg/dL — ABNORMAL HIGH (ref 70–99)

## 2012-09-28 LAB — BASIC METABOLIC PANEL
CO2: 33 mEq/L — ABNORMAL HIGH (ref 19–32)
Chloride: 99 mEq/L (ref 96–112)
Creatinine, Ser: 1.52 mg/dL — ABNORMAL HIGH (ref 0.50–1.35)
GFR calc Af Amer: 45 mL/min — ABNORMAL LOW (ref >90)
Potassium: 3.8 mEq/L (ref 3.5–5.1)
Sodium: 141 mEq/L (ref 135–145)

## 2012-09-28 NOTE — Progress Notes (Signed)
Physical Therapy Treatment Patient Details Name: [name] [name] MRN: [id number] DOB: 1921-11-07 Today's Date: 09/28/2012 Time: 7829-5621 PT Time Calculation (min): 19 min  PT Assessment / Plan / Recommendation Comments on Treatment Session  Pt admitted with PNA, SOB, respiratory failure and still not progressing with ambulation distance due to fatigue and SOB. Continue to recommend ST-SNF prior to home due to decreased activity tolerance and mobility. Pt encouraged to continue mobility with nursing staff.     Follow Up Recommendations        Does the patient have the potential to tolerate intense rehabilitation     Barriers to Discharge        Equipment Recommendations       Recommendations for Other Services    Frequency     Plan Discharge plan remains appropriate;Frequency remains appropriate    Precautions / Restrictions Precautions Precautions: Fall Precaution Comments: watch O2 sats Restrictions Weight Bearing Restrictions: No   Pertinent Vitals/Pain sats 96% on 2L at rest, HR 67 down to 84% on 4L with ambulation 89% on 6L with ambulation Back up to94% on 2L at rest after ambulation with HR 70 No pain     Mobility  Bed Mobility Supine to Sit: 6: Modified independent (Device/Increase time);With rails;HOB elevated (HOB 15degrees) Transfers Sit to Stand: 5: Supervision;From bed Stand to Sit: 5: Supervision;To chair/3-in-1 Details for Transfer Assistance: cueing for hand placement and safety, pt with tendency not to bring RW all the way back to surface before attempting to sit Ambulation/Gait Ambulation/Gait Assistance: 4: Min assist Ambulation Distance (Feet): 70 Feet Assistive device: Rolling walker Ambulation/Gait Assistance Details: cueing for posture and position in RW. Pt not rushing with fatigue today but needed cueing for such. Sats dropped to 84% on 4L with first half of ambulation and down to 89% on 6L with second half of distance despite cueing for  breathing technique and resting in standing to recover sats.  Gait Pattern: Step-through pattern;Decreased stride length;Trunk flexed Gait velocity: decreased Stairs: No    Exercises General Exercises - Lower Extremity Long Arc Quad: AROM;Both;20 reps;Seated Hip ABduction/ADduction: AROM;Both;20 reps;Seated Hip Flexion/Marching: AROM;Both;20 reps;Seated   PT Diagnosis:    PT Problem List:   PT Treatment Interventions:     PT Goals Acute Rehab PT Goals PT Goal: Supine/Side to Sit - Progress: Progressing toward goal PT Goal: Sit to Stand - Progress: Progressing toward goal PT Goal: Stand to Sit - Progress: Progressing toward goal PT Goal: Ambulate - Progress: Progressing toward goal  Visit Information  Last PT Received On: 09/28/12    Subjective Data  Subjective: I'm still tired even though I slept last night   Cognition  Cognition Overall Cognitive Status: Appears within functional limits for tasks assessed/performed Arousal/Alertness: Awake/alert Orientation Level: Appears intact for tasks assessed Behavior During Session: [facility] for tasks performed    Balance     End of Session PT - End of Session Equipment Utilized During Treatment: Oxygen Activity Tolerance: Patient limited by fatigue Patient left: in chair;with call bell/phone within reach Nurse Communication: Mobility status   GP     [name] 09/28/2012, 12:31 PM Delaney Meigs, PT [phone]

## 2012-09-28 NOTE — Progress Notes (Signed)
[facility]  Subjective: Breathing better.  No orthopnea.  Objective: Vital signs in last 24 hours: Temp:  [97.5 F (36.4 C)-98.2 F (36.8 C)] 98 F (36.7 C) (02/13 0528) Pulse Rate:  [69-78] 69 (02/13 0528) Resp:  [18] 18 (02/13 0528) BP: (123-162)/(61-89) 142/71 mmHg (02/13 0528) SpO2:  [93 %-98 %] 98 % (02/13 0528) Weight:  [77.5 kg (170 lb 13.7 oz)] 77.5 kg (170 lb 13.7 oz) (02/13 0528) Last BM Date: 09/27/12  Intake/Output from previous day: 02/12 0701 - 02/13 0700 In: 1130 [P.O.:1130] Out: 1200 [Urine:1200] Intake/Output this shift: Total I/O In: 240 [P.O.:240] Out: 125 [Urine:125]  Medications Current Facility-Administered Medications  Medication Dose Route Frequency Provider Last Rate Last Dose  . acetaminophen (TYLENOL) tablet 650 mg  650 mg Oral Q6H PRN Marinda Elk, MD       Or  . acetaminophen (TYLENOL) suppository 650 mg  650 mg Rectal Q6H PRN Marinda Elk, MD      . albuterol (PROVENTIL) (5 MG/ML) 0.5% nebulizer solution 2.5 mg  2.5 mg Nebulization Q2H PRN Marinda Elk, MD   2.5 mg at 09/22/12 2109  . amiodarone (PACERONE) tablet 400 mg  400 mg Oral BID Nada Boozer, NP   400 mg at 09/27/12 2107  . antiseptic oral rinse (BIOTENE) solution 15 mL  15 mL Mouth Rinse BID Marinda Elk, MD   15 mL at 09/27/12 2100  . apixaban (ELIQUIS) tablet 2.5 mg  2.5 mg Oral BID Marinda Elk, MD   2.5 mg at 09/27/12 2107  . digoxin (LANOXIN) tablet 0.125 mg  0.125 mg Oral Daily Eda Paschal [place], [place]      . furosemide (LASIX) tablet 40 mg  40 mg Oral Daily Eda Paschal [place], [place]      . haloperidol lactate (HALDOL) injection 1 mg  1 mg Intravenous Q6H PRN Marinda Elk, MD      . insulin aspart (novoLOG) injection 0-15 Units  0-15 Units Subcutaneous TID WC Kathlen Mody, MD   2 Units at 09/28/12 [phone]  . insulin aspart (novoLOG) injection 0-5 Units  0-5 Units Subcutaneous QHS Kathlen Mody, MD   2 Units at 09/25/12 2315   . levofloxacin (LEVAQUIN) IVPB 750 mg  750 mg Intravenous Q48H Meera Concha Se, PHARMD   750 mg at 09/26/12 1741  . metoprolol tartrate (LOPRESSOR) tablet 25 mg  25 mg Oral Q8H Nada Boozer, NP   25 mg at 09/28/12 0255  . ondansetron (ZOFRAN) tablet 4 mg  4 mg Oral Q6H PRN Marinda Elk, MD       Or  . ondansetron [facility]) injection 4 mg  4 mg Intravenous Q6H PRN Marinda Elk, MD      . polyethylene glycol [facility] Hal) packet 17 g  17 g Oral Daily PRN Marinda Elk, MD      . potassium chloride SA (K-DUR,KLOR-CON) CR tablet 20 mEq  20 mEq Oral Daily Eda Paschal [place], [place]      . sodium chloride 0.9 % injection 3 mL  3 mL Intravenous Q12H Marinda Elk, MD   3 mL at 09/27/12 2107    PE: General appearance: alert, cooperative and no distress Lungs: clear to auscultation bilaterally Heart: regular rate and rhythm and soft 1/6 sys MM Extremities: No LEE Pulses: 2+ and symmetric Skin: Warm and dry Neurologic: Grossly normal  Lab Results:   Recent Labs  09/27/12 0610  WBC 12.7*  HGB 11.7*  HCT 34.7*  PLT 248   BMET  Recent Labs  09/26/12 0600 09/27/12 0610 09/28/12 0620  NA 142 142 141  K 3.2* 3.9 3.8  CL 98 98 99  CO2 35* 33* 33*  GLUCOSE 143* 158* 154*  BUN 42* 49* 48*  CREATININE 1.33 1.63* 1.52*  CALCIUM 8.8 8.8 8.7    Assessment/Plan   Principal Problem:   Respiratory failure, acute Active Problems:   Colon cancer, remote colostomy   HYPERLIPIDEMIA   HTN (hypertension)   Hypertension   BPH (benign prostatic hyperplasia)   Non Hodgkin's lymphoma, Rx'd with chemotherapy '03   AAA recent Abd [place] "[place]"   CKD (chronic kidney disease) stage 3, GFR 30-59 ml/min   PAF, was in SR 09/19/12 now A fib with RVR   Pacemaker-St.Jude, implanted June 2013   Chronic anticoagulation, [name]   Healthcare-associated pneumonia   Acute diastolic heart failure - due to PNA & Afib RVR  Plan:  Lasix decreased to 40mg  PO daily yest.  Net fluids: -70ml in  the last 24 hrs.  Mildly improve SCr. in the setting of A on C kidney disease. BP and HR stable.  Potassium WNL.  Can likely DC today.  Will arrange followup at [facility].   LOS: 6 days    HAGER, BRYAN 09/28/2012 8:56 AM   Patient seen and examined. Agree with assessment and plan. Breathing much better. Renal function appears to be stabilizing. Recommend decreasing amiodarone to 400 daily beginning tomorrow.   Lennette Bihari, MD, [facility] 09/28/2012 10:25 AM

## 2012-09-28 NOTE — Progress Notes (Signed)
UR Completed. [place]iah Trawick, RN, BSN Nurse Case Manager  [phone]  

## 2012-09-28 NOTE — Progress Notes (Signed)
[facility] PROGRESS NOTE  [name] [name] OZH:[id number] DOB: 1922-06-10 DOA: 09/22/2012 PCP: Carollee Herter, MD  Assessment/Plan:  Acute hypoxic respiratory failure due to Healthcare-associated pneumonia/copd exacerbation/ Fluid overload:   - Improved. -CWR with improved pulmonary infiltrates. -  Has received 5 days of HCAP treatment; will DC vanc/cefepime and keep him on levaquin only for an additional 5 days. (2/5). -Is 4.8L negative since admission. -Cardiology is adjusting diuretic dose.  I/O last 3 completed shifts: In: 1420 [P.O.:1370; IV Piggyback:50] Out: 1800 [Urine:1800] Total I/O In: 240 [P.O.:240] Out: 326 [Urine:325; Stool:1]    CKD (chronic kidney disease) stage 3, GFR 30-59 ml/min  - Creatinine improving with decrease in lasix dose. - ARB is on hold.  Pacemaker-St.Jude, implanted June 2013 / Atrial Fibrillation:  - On Chronic anticoagulation, [name].and amiodarone.  -Cards is following.  H/O colostomy  - Stable no signs infection.   Anemia: normocytic. Stable. No need for transfusion at this point.    Diabetes Mellitus: SSI. HGba1c is 6.7 CBG (last 3)   Recent Labs  09/27/12 2139 09/28/12 0638 09/28/12 1102  GLUCAP 158* 147* 155*      Code Status: full code Family Communication: none at bedside Disposition Plan: SNF. Will alert SW.      HPI/Subjective: Denies any pain, COMFORTABLE.   Objective: Filed Vitals:   09/27/12 1759 09/27/12 1957 09/28/12 0253 09/28/12 0528  BP: 130/61 136/64 162/89 142/71  Pulse: 74 69 69 69  Temp:  98.2 F (36.8 C)  98 F (36.7 C)  TempSrc:  Oral  Oral  Resp:  18 18 18   Height:      Weight:    77.5 kg (170 lb 13.7 oz)  SpO2:  98% 97% 98%    Intake/Output Summary (Last 24 hours) at 09/28/12 1304 Last data filed at 09/28/12 1106  Gross per 24 hour  Intake    780 ml  Output    896 ml  Net   -116 ml   Filed Weights   09/26/12 2253 09/27/12 0439 09/28/12 0528  Weight: 79.6 kg (175  lb 7.8 oz) 79.2 kg (174 lb 9.7 oz) 77.5 kg (170 lb 13.7 oz)    Exam:   General:  Alert afebrile comfortable, and on 3 lit of nasal canula oxygen.   Cardiovascular: s1s2 RRR.  Respiratory: FAIR air entry bilateral, scattered rhonchi.   Abdomen: sof t NT ND BS+. Colostomy bag .  Data Reviewed: Basic Metabolic Panel:  Recent Labs Lab 09/24/12 0530 09/25/12 0410 09/26/12 0600 09/27/12 0610 09/28/12 0620  NA 143 140 142 142 141  K 3.4* 3.3* 3.2* 3.9 3.8  CL 99 96 98 98 99  CO2 28 30 35* 33* 33*  GLUCOSE 189* 255* 143* 158* 154*  BUN 30* 40* 42* 49* 48*  CREATININE 1.17 1.29 1.33 1.63* 1.52*  CALCIUM 9.2 8.8 8.8 8.8 8.7  MG  --  2.1  --  2.2  --    Liver Function Tests:  Recent Labs Lab 09/22/12 1506 09/23/12 0604  AST 47* 49*  ALT 41 49  ALKPHOS 81 82  BILITOT 0.6 0.5  PROT 6.6 6.1  ALBUMIN 2.7* 2.4*   No results found for this basename: LIPASE, AMYLASE,  in the last 168 hours No results found for this basename: AMMONIA,  in the last 168 hours CBC:  Recent Labs Lab 09/22/12 1506 09/23/12 0604 09/27/12 0610  WBC 10.1 8.7 12.7*  NEUTROABS 8.3*  --   --   HGB 9.8* 9.3* 11.7*  HCT  28.3* 27.3* 34.7*  MCV 90.1 90.4 88.7  PLT 224 209 248   Cardiac Enzymes:  Recent Labs Lab 09/22/12 1517 09/23/12 1104 09/23/12 1640 09/23/12 2342  TROPONINI <0.30 <0.30 <0.30 <0.30   BNP (last 3 results)  Recent Labs  09/23/12 1104 09/25/12 0410 09/27/12 0610  PROBNP 4943.0* 8257.0* 5061.0*   CBG:  Recent Labs Lab 09/27/12 1135 09/27/12 1722 09/27/12 2139 09/28/12 0638 09/28/12 1102  GLUCAP 187* 212* 158* 147* 155*    Recent Results (from the past 240 hour(s))  CULTURE, BLOOD (ROUTINE X 2)     Status: None   Collection Time    09/22/12  5:10 PM      Result Value Range Status   Specimen Description BLOOD ARM LEFT   Final   Special Requests BOTTLES DRAWN AEROBIC AND ANAEROBIC 10CC   Final   Culture  Setup Time 09/22/2012 22:30   Final   Culture      Final   Value:        BLOOD CULTURE RECEIVED NO GROWTH TO DATE CULTURE WILL BE HELD FOR 5 DAYS BEFORE ISSUING A FINAL NEGATIVE REPORT   Report Status PENDING   Incomplete  CULTURE, BLOOD (ROUTINE X 2)     Status: None   Collection Time    09/22/12  5:20 PM      Result Value Range Status   Specimen Description BLOOD HAND RIGHT   Final   Special Requests BOTTLES DRAWN AEROBIC ONLY 5CC   Final   Culture  Setup Time 09/22/2012 22:31   Final   Culture     Final   Value:        BLOOD CULTURE RECEIVED NO GROWTH TO DATE CULTURE WILL BE HELD FOR 5 DAYS BEFORE ISSUING A FINAL NEGATIVE REPORT   Report Status PENDING   Incomplete  CULTURE, BLOOD (ROUTINE X 2)     Status: None   Collection Time    09/22/12  7:12 PM      Result Value Range Status   Specimen Description BLOOD RIGHT HAND   Final   Special Requests BOTTLES DRAWN AEROBIC ONLY 3CC   Final   Culture  Setup Time 09/22/2012 22:31   Final   Culture     Final   Value:        BLOOD CULTURE RECEIVED NO GROWTH TO DATE CULTURE WILL BE HELD FOR 5 DAYS BEFORE ISSUING A FINAL NEGATIVE REPORT   Report Status PENDING   Incomplete  CULTURE, BLOOD (ROUTINE X 2)     Status: None   Collection Time    09/22/12  7:13 PM      Result Value Range Status   Specimen Description BLOOD LEFT ARM   Final   Special Requests     Final   Value: BOTTLES DRAWN AEROBIC AND ANAEROBIC 10CC AER,8CC ANA   Culture  Setup Time 09/22/2012 22:30   Final   Culture     Final   Value:        BLOOD CULTURE RECEIVED NO GROWTH TO DATE CULTURE WILL BE HELD FOR 5 DAYS BEFORE ISSUING A FINAL NEGATIVE REPORT   Report Status PENDING   Incomplete  MRSA PCR SCREENING     Status: None   Collection Time    09/22/12  8:35 PM      Result Value Range Status   MRSA by PCR NEGATIVE  NEGATIVE Final   Comment:            The GeneXpert MRSA  Assay (FDA     approved for NASAL specimens     only), is one component of a     comprehensive MRSA colonization     surveillance program. It is not      intended to diagnose MRSA     infection nor to guide or     monitor treatment for     MRSA infections.     Studies: Dg Chest 2 View  09/27/2012  *RADIOLOGY REPORT*  Clinical Data: Weakness, pacemaker, fall wrist, pneumonia  CHEST - 2 VIEW  Comparison: 09/24/2012  Findings: Left subclavian sequential transvenous pacemaker leads project at right atrium and right ventricle. Enlargement of cardiac silhouette. Calcified tortuous aorta. Pulmonary vascularity grossly normal. Improved pulmonary infiltrates. Tiny right pleural effusion. No pneumothorax.  IMPRESSION: Improved pulmonary infiltrates.   Original Report Authenticated By: Ulyses Southward, M.D.     Scheduled Meds: . amiodarone  400 mg Oral BID  . antiseptic oral rinse  15 mL Mouth Rinse BID  . apixaban  2.5 mg Oral BID  . digoxin  0.125 mg Oral Daily  . furosemide  40 mg Oral Daily  . insulin aspart  0-15 Units Subcutaneous TID WC  . insulin aspart  0-5 Units Subcutaneous QHS  . levofloxacin (LEVAQUIN) IV  750 mg Intravenous Q48H  . metoprolol tartrate  25 mg Oral Q8H  . potassium chloride  20 mEq Oral Daily  . sodium chloride  3 mL Intravenous Q12H   Continuous Infusions:   Principal Problem:   Respiratory failure, acute Active Problems:   Colon cancer, remote colostomy   HYPERLIPIDEMIA   HTN (hypertension)   Hypertension   BPH (benign prostatic hyperplasia)   Non Hodgkin's lymphoma, Rx'd with chemotherapy '03   AAA recent Abd [place] "[place]"   CKD (chronic kidney disease) stage 3, GFR 30-59 ml/min   PAF, was in SR 09/19/12 now A fib with RVR   Pacemaker-St.Jude, implanted June 2013   Chronic anticoagulation, [name]   Healthcare-associated pneumonia   Acute diastolic heart failure - due to PNA & Afib RVR    Time spent: 35 min    [name] [name],[name] [name]  [facility] Pager [phone]. If 8PM-8AM, please contact night-coverage at [email or web address], password [facility] 09/28/2012, 1:04 PM  LOS: 6 days

## 2012-09-29 LAB — CULTURE, BLOOD (ROUTINE X 2): Culture: NO GROWTH

## 2012-09-29 LAB — BASIC METABOLIC PANEL
BUN: 43 mg/dL — ABNORMAL HIGH (ref 6–23)
Chloride: 100 mEq/L (ref 96–112)
GFR calc Af Amer: 42 mL/min — ABNORMAL LOW (ref >90)
GFR calc non Af Amer: 36 mL/min — ABNORMAL LOW (ref >90)
Potassium: 4.3 mEq/L (ref 3.5–5.1)
Sodium: 143 mEq/L (ref 135–145)

## 2012-09-29 LAB — GLUCOSE, CAPILLARY
Glucose-Capillary: 153 mg/dL — ABNORMAL HIGH (ref 70–99)
Glucose-Capillary: 141 mg/dL — ABNORMAL HIGH (ref 70–99)

## 2012-09-29 MED ORDER — POLYETHYLENE GLYCOL 3350 17 G PO PACK: 17.0000 g | PACK | Freq: Every day | ORAL | Status: AC | PRN

## 2012-09-29 MED ORDER — AMIODARONE HCL 200 MG PO TABS
400.0000 mg | ORAL_TABLET | Freq: Every day | ORAL | Status: DC
Start: 2012-09-30 — End: 2012-09-29

## 2012-09-29 MED ORDER — AMIODARONE HCL 200 MG PO TABS: 400.0000 mg | ORAL_TABLET | Freq: Every day | ORAL | Status: AC

## 2012-09-29 MED ORDER — METOPROLOL TARTRATE 25 MG PO TABS: 25.0000 mg | ORAL_TABLET | Freq: Three times a day (TID) | ORAL | Status: DC

## 2012-09-29 MED ORDER — FUROSEMIDE 40 MG PO TABS: 40.0000 mg | ORAL_TABLET | Freq: Every day | ORAL | Status: DC

## 2012-09-29 MED ORDER — DIGOXIN 125 MCG PO TABS: 0.1250 mg | ORAL_TABLET | Freq: Every day | ORAL | Status: AC

## 2012-09-29 MED ORDER — POTASSIUM CHLORIDE CRYS ER 20 MEQ PO TBCR: 20.0000 meq | EXTENDED_RELEASE_TABLET | Freq: Every day | ORAL | Status: AC

## 2012-09-29 NOTE — Progress Notes (Addendum)
[facility] PROGRESS NOTE  [name] [name] ZOX:[id number] DOB: July 04, 1922 DOA: 09/22/2012 PCP: Carollee Herter, MD  Assessment/Plan:  Acute hypoxic respiratory failure due to Healthcare-associated pneumonia/copd exacerbation/ Fluid overload:   - Improved. -CWR with improved pulmonary infiltrates. - Has completed his antibiotic course at this point. -Is 4.9L negative since admission. -Lasix decreased to 40 mg daily.  I/O last 3 completed shifts: In: 1880 [P.O.:1580; IV Piggyback:300] Out: 2228 [Urine:2225; Stool:3] Total I/O In: 220 [P.O.:220] Out: 200 [Urine:200]    CKD (chronic kidney disease) stage 3, GFR 30-59 ml/min  - Creatinine seems to be stabilizing around his baseline of 1.4-1.6. - ARB is on hold.  Pacemaker-St.Jude, implanted June 2013 / Atrial Fibrillation:  - On Chronic anticoagulation, [name].and amiodarone.  -Cards is following.  H/O colostomy  - Stable no signs infection.   Anemia: normocytic. Stable. No need for transfusion at this point.    Diabetes Mellitus: SSI. HGba1c is 6.7 CBG (last 3)   Recent Labs  09/28/12 1641 09/28/12 2046 09/29/12 0604  GLUCAP 134* 122* 122*      Code Status: full code Family Communication: none at bedside Disposition Plan:  He is medically ready for DC. Awaiting SNF.       HPI/Subjective: Denies any pain, COMFORTABLE.   Objective: Filed Vitals:   09/28/12 2009 09/29/12 0613 09/29/12 0926 09/29/12 0928  BP: 123/73 137/73 111/59   Pulse: 69 70 70 70  Temp: 98 F (36.7 C) 97.5 F (36.4 C)    TempSrc: Oral Oral    Resp: 20 19    Height:      Weight:  77.429 kg (170 lb 11.2 oz)    SpO2: 97% 97%      Intake/Output Summary (Last 24 hours) at 09/29/12 1026 Last data filed at 09/29/12 0855  Gross per 24 hour  Intake   1560 ml  Output   2053 ml  Net   -493 ml   Filed Weights   09/27/12 0439 09/28/12 0528 09/29/12 0613  Weight: 79.2 kg (174 lb 9.7 oz) 77.5 kg (170 lb 13.7 oz) 77.429 kg  (170 lb 11.2 oz)    Exam:   General:  Alert afebrile comfortable, and on 3 lit of nasal canula oxygen.   Cardiovascular: s1s2 RRR.  Respiratory: FAIR air entry bilateral, scattered rhonchi.   Abdomen: sof t NT ND BS+. Colostomy bag .  Data Reviewed: Basic Metabolic Panel:  Recent Labs Lab 09/25/12 0410 09/26/12 0600 09/27/12 0610 09/28/12 0620 09/29/12 0540  NA 140 142 142 141 143  K 3.3* 3.2* 3.9 3.8 4.3  CL 96 98 98 99 100  CO2 30 35* 33* 33* 36*  GLUCOSE 255* 143* 158* 154* 131*  BUN 40* 42* 49* 48* 43*  CREATININE 1.29 1.33 1.63* 1.52* 1.62*  CALCIUM 8.8 8.8 8.8 8.7 9.0  MG 2.1  --  2.2  --   --    Liver Function Tests:  Recent Labs Lab 09/22/12 1506 09/23/12 0604  AST 47* 49*  ALT 41 49  ALKPHOS 81 82  BILITOT 0.6 0.5  PROT 6.6 6.1  ALBUMIN 2.7* 2.4*   No results found for this basename: LIPASE, AMYLASE,  in the last 168 hours No results found for this basename: AMMONIA,  in the last 168 hours CBC:  Recent Labs Lab 09/22/12 1506 09/23/12 0604 09/27/12 0610  WBC 10.1 8.7 12.7*  NEUTROABS 8.3*  --   --   HGB 9.8* 9.3* 11.7*  HCT 28.3* 27.3* 34.7*  MCV 90.1 90.4  88.7  PLT 224 209 248   Cardiac Enzymes:  Recent Labs Lab 09/22/12 1517 09/23/12 1104 09/23/12 1640 09/23/12 2342  TROPONINI <0.30 <0.30 <0.30 <0.30   BNP (last 3 results)  Recent Labs  09/23/12 1104 09/25/12 0410 09/27/12 0610  PROBNP 4943.0* 8257.0* 5061.0*   CBG:  Recent Labs Lab 09/28/12 0638 09/28/12 1102 09/28/12 1641 09/28/12 2046 09/29/12 0604  GLUCAP 147* 155* 134* 122* 122*    Recent Results (from the past 240 hour(s))  CULTURE, BLOOD (ROUTINE X 2)     Status: None   Collection Time    09/22/12  5:10 PM      Result Value Range Status   Specimen Description BLOOD ARM LEFT   Final   Special Requests BOTTLES DRAWN AEROBIC AND ANAEROBIC 10CC   Final   Culture  Setup Time 09/22/2012 22:30   Final   Culture NO GROWTH 5 DAYS   Final   Report Status  09/29/2012 FINAL   Final  CULTURE, BLOOD (ROUTINE X 2)     Status: None   Collection Time    09/22/12  5:20 PM      Result Value Range Status   Specimen Description BLOOD HAND RIGHT   Final   Special Requests BOTTLES DRAWN AEROBIC ONLY 5CC   Final   Culture  Setup Time 09/22/2012 22:31   Final   Culture NO GROWTH 5 DAYS   Final   Report Status 09/29/2012 FINAL   Final  CULTURE, BLOOD (ROUTINE X 2)     Status: None   Collection Time    09/22/12  7:12 PM      Result Value Range Status   Specimen Description BLOOD RIGHT HAND   Final   Special Requests BOTTLES DRAWN AEROBIC ONLY 3CC   Final   Culture  Setup Time 09/22/2012 22:31   Final   Culture NO GROWTH 5 DAYS   Final   Report Status 09/29/2012 FINAL   Final  CULTURE, BLOOD (ROUTINE X 2)     Status: None   Collection Time    09/22/12  7:13 PM      Result Value Range Status   Specimen Description BLOOD LEFT ARM   Final   Special Requests     Final   Value: BOTTLES DRAWN AEROBIC AND ANAEROBIC 10CC AER,8CC ANA   Culture  Setup Time 09/22/2012 22:30   Final   Culture NO GROWTH 5 DAYS   Final   Report Status 09/29/2012 FINAL   Final  MRSA PCR SCREENING     Status: None   Collection Time    09/22/12  8:35 PM      Result Value Range Status   MRSA by PCR NEGATIVE  NEGATIVE Final   Comment:            The GeneXpert MRSA Assay (FDA     approved for NASAL specimens     only), is one component of a     comprehensive MRSA colonization     surveillance program. It is not     intended to diagnose MRSA     infection nor to guide or     monitor treatment for     MRSA infections.     Studies: No results found.  Scheduled Meds: . amiodarone  400 mg Oral BID  . antiseptic oral rinse  15 mL Mouth Rinse BID  . apixaban  2.5 mg Oral BID  . digoxin  0.125 mg Oral Daily  . furosemide  40 mg Oral Daily  . insulin aspart  0-15 Units Subcutaneous TID WC  . insulin aspart  0-5 Units Subcutaneous QHS  . metoprolol tartrate  25 mg Oral Q8H   . potassium chloride  20 mEq Oral Daily  . sodium chloride  3 mL Intravenous Q12H   Continuous Infusions:   Principal Problem:   Respiratory failure, acute Active Problems:   Colon cancer, remote colostomy   HYPERLIPIDEMIA   HTN (hypertension)   Hypertension   BPH (benign prostatic hyperplasia)   Non Hodgkin's lymphoma, Rx'd with chemotherapy '03   AAA recent Abd [place] "[place]"   CKD (chronic kidney disease) stage 3, GFR 30-59 ml/min   PAF, was in SR 09/19/12 now A fib with RVR   Pacemaker-St.Jude, implanted June 2013   Chronic anticoagulation, [name]   Healthcare-associated pneumonia   Acute diastolic heart failure - due to PNA & Afib RVR    Time spent: 35 min    HERNANDEZ ACOSTA,Jing Howatt  [facility] Pager [phone]. If 8PM-8AM, please contact night-coverage at [email or web address], password [facility] 09/29/2012, 10:26 AM  LOS: 7 days

## 2012-09-29 NOTE — Progress Notes (Signed)
Report called to Sona, nurse at [facility] where pt is being transferred to.  Verbalized understanding.  Amanda Pea, Charity fundraiser.

## 2012-09-29 NOTE — Progress Notes (Signed)
[facility]  Subjective: Feeling better. Breathing better. No complaints.  Objective: Vital signs in last 24 hours: Temp:  [97.5 F (36.4 C)-98.6 F (37 C)] 97.5 F (36.4 C) (02/14 9811) Pulse Rate:  [69-70] 70 (02/14 0928) Resp:  [18-20] 19 (02/14 0613) BP: (111-142)/(59-78) 111/59 mmHg (02/14 0926) SpO2:  [97 %-98 %] 97 % (02/14 0613) Weight:  [170 lb 11.2 oz (77.429 kg)] 170 lb 11.2 oz (77.429 kg) (02/14 0613) Last BM Date: 09/28/12  Intake/Output from previous day: 02/13 0701 - 02/14 0700 In: 1580 [P.O.:1280; IV Piggyback:300] Out: 1978 [Urine:1975; Stool:3] Intake/Output this shift: Total I/O In: 220 [P.O.:220] Out: 200 [Urine:200]  Medications Current Facility-Administered Medications  Medication Dose Route Frequency Provider Last Rate Last Dose  . acetaminophen (TYLENOL) tablet 650 mg  650 mg Oral Q6H PRN Marinda Elk, MD       Or  . acetaminophen (TYLENOL) suppository 650 mg  650 mg Rectal Q6H PRN Marinda Elk, MD      . albuterol (PROVENTIL) (5 MG/ML) 0.5% nebulizer solution 2.5 mg  2.5 mg Nebulization Q2H PRN Marinda Elk, MD   2.5 mg at 09/22/12 2109  . amiodarone (PACERONE) tablet 400 mg  400 mg Oral BID Nada Boozer, NP   400 mg at 09/29/12 0925  . antiseptic oral rinse (BIOTENE) solution 15 mL  15 mL Mouth Rinse BID Marinda Elk, MD   15 mL at 09/29/12 0800  . apixaban (ELIQUIS) tablet 2.5 mg  2.5 mg Oral BID Marinda Elk, MD   2.5 mg at 09/29/12 9147  . digoxin (LANOXIN) tablet 0.125 mg  0.125 mg Oral Daily Eda Paschal [place], [place]   0.125 mg at 09/29/12 8295  . furosemide (LASIX) tablet 40 mg  40 mg Oral Daily Eda Paschal [place], [place]   40 mg at 09/29/12 6213  . haloperidol lactate (HALDOL) injection 1 mg  1 mg Intravenous Q6H PRN Marinda Elk, MD      . insulin aspart (novoLOG) injection 0-15 Units  0-15 Units Subcutaneous TID WC Kathlen Mody, MD   2 Units at 09/29/12 0737  . insulin aspart (novoLOG)  injection 0-5 Units  0-5 Units Subcutaneous QHS Kathlen Mody, MD   2 Units at 09/25/12 2315  . metoprolol tartrate (LOPRESSOR) tablet 25 mg  25 mg Oral Q8H Nada Boozer, NP   25 mg at 09/29/12 0926  . ondansetron (ZOFRAN) tablet 4 mg  4 mg Oral Q6H PRN Marinda Elk, MD       Or  . ondansetron [facility]) injection 4 mg  4 mg Intravenous Q6H PRN Marinda Elk, MD      . polyethylene glycol [facility]) packet 17 g  17 g Oral Daily PRN Marinda Elk, MD      . potassium chloride SA (K-DUR,KLOR-CON) CR tablet 20 mEq  20 mEq Oral Daily Eda Paschal [place], [place]   20 mEq at 09/29/12 0865  . sodium chloride 0.9 % injection 3 mL  3 mL Intravenous Q12H Marinda Elk, MD   3 mL at 09/29/12 7846    PE: General appearance: alert, cooperative and no distress Lungs: clear to auscultation bilaterally Heart: regular rate and rhythm, 2/6 murmur best heard at LUSB Extremities: no LEE Pulses: 2+ and symmetric Skin: warm and dry Neurologic: Grossly normal  Lab Results:   Recent Labs  09/27/12 0610  WBC 12.7*  HGB 11.7*  HCT 34.7*  PLT 248   BMET  Recent Labs  09/27/12 0610 09/28/12 0620 09/29/12 0540  NA 142 141 143  K 3.9 3.8 4.3  CL 98 99 100  CO2 33* 33* 36*  GLUCOSE 158* 154* 131*  BUN 49* 48* 43*  CREATININE 1.63* 1.52* 1.62*  CALCIUM 8.8 8.7 9.0    Assessment/Plan    Principal Problem:   Respiratory failure, acute Active Problems:   Colon cancer, remote colostomy   HYPERLIPIDEMIA   HTN (hypertension)   Hypertension   BPH (benign prostatic hyperplasia)   Non Hodgkin's lymphoma, Rx'd with chemotherapy '03   AAA recent Abd [place] "[place]"   CKD (chronic kidney disease) stage 3, GFR 30-59 ml/min   PAF, was in SR 09/19/12 now A fib with RVR   Pacemaker-St.Jude, implanted June 2013   Chronic anticoagulation, [name]   Healthcare-associated pneumonia   Acute diastolic heart failure - due to PNA & Afib RVR   Plan: Pt is breathing better. He has diuresed ~5L  since admission. Lasix was decreased from 40 BID to 40 mg daily, 2 days ago. Cr is slightly elevated since yesterday, from 1.52 to 1.62. Cr does however appear to be stabilizing. K+ is WNL. HR and BP are both stable. Per Dr. Landry Dyke note yesterday, plan to decrease Amiodarone to 400 mg daily. ? Pt should be nearing d/c soon. MD to follow with further recommendation.    LOS: 7 days    Brittainy M. Delmer Islam 09/29/2012 9:52 AM

## 2012-09-29 NOTE — Progress Notes (Signed)
Pt d/c to to [facility] facility via ambulance.  Amanda Pea, RN

## 2012-09-29 NOTE — Discharge Summary (Signed)
Physician Discharge Summary  Patient ID: [name] [name] MRN: [id number] DOB/AGE: 1922-07-20 79 y.o.  Admit date: 09/22/2012 Discharge date: 09/29/2012  Primary Care Physician:  Carollee Herter, MD   Discharge Diagnoses:    Principal Problem:   Respiratory failure, acute Active Problems:   Colon cancer, remote colostomy   HYPERLIPIDEMIA   HTN (hypertension)   Hypertension   BPH (benign prostatic hyperplasia)   Non Hodgkin's lymphoma, Rx'd with chemotherapy '03   AAA recent Abd [place] "[place]"   CKD (chronic kidney disease) stage 3, GFR 30-59 ml/min   PAF, was in SR 09/19/12 now A fib with RVR   Pacemaker-St.Jude, implanted June 2013   Chronic anticoagulation, [name]   Healthcare-associated pneumonia   Acute diastolic heart failure - due to PNA & Afib RVR      Medication List    STOP taking these medications       amLODipine-valsartan 5-160 MG per tablet  Commonly known as:  EXFORGE     atenolol 25 MG tablet  Commonly known as:  TENORMIN      TAKE these medications       amiodarone 200 MG tablet  Commonly known as:  PACERONE  Take 2 tablets (400 mg total) by mouth daily.     apixaban 5 MG Tabs tablet  Commonly known as:  ELIQUIS  Take 0.5 tablets (2.5 mg total) by mouth 2 (two) times daily.     digoxin 0.125 MG tablet  Commonly known as:  LANOXIN  Take 1 tablet (0.125 mg total) by mouth daily.     escitalopram 20 MG tablet  Commonly known as:  LEXAPRO  Take 1 tablet (20 mg total) by mouth daily.     furosemide 40 MG tablet  Commonly known as:  LASIX  Take 1 tablet (40 mg total) by mouth daily.     metoprolol tartrate 25 MG tablet  Commonly known as:  LOPRESSOR  Take 1 tablet (25 mg total) by mouth every 8 (eight) hours.     niacin 500 MG CR tablet  Commonly known as:  NIASPAN  Take 1 tablet (500 mg total) by mouth at bedtime.     pantoprazole 40 MG tablet  Commonly known as:  PROTONIX  Take 1 tablet (40 mg total) by mouth daily at 12 noon.     polyethylene glycol packet  Commonly known as:  MIRALAX / GLYCOLAX  Take 17 g by mouth daily as needed.     potassium chloride SA 20 MEQ tablet  Commonly known as:  K-DUR,KLOR-CON  Take 1 tablet (20 mEq total) by mouth daily.     silodosin 8 MG Caps capsule  Commonly known as:  RAPAFLO  Take 1 capsule (8 mg total) by mouth every morning.         Disposition and Follow-up:  Will be discharged to SNF today in stable and improved condition. Should have a BMEt in 3 days to follow up on his renal function.  Consults:  Cardiology, Dr. Herbie [place].   Significant Diagnostic Studies:  No results found.  Brief H and P: For complete details please refer to admission H and P, but in brief patient is an 79 y.o. Man With past medical history of colon cancer, hypertension, A. fib status post pacemaker placement, currently on [name], history of colon cancer S/p surgery that came in because of shortness of breath and cough that started 2 weeks prior to admission. He relates he started feeding short of breath and then developed a cough  which has progressively gotten worse to the point where he can't even get out of bed without being short of breath. He relates he's never been so tired and his wife. He relates no fevers. No diarrhea, chest pain. He relates no night sweats. Today when he got up she was so tired he can move from his bed so she decided to call EMS and was brought here to the hospital. He had been using his wife's oxygen at home. We were asked to admit him for further evaluation and management.     Hospital Course:  Principal Problem:   Respiratory failure, acute Active Problems:   Colon cancer, remote colostomy   HYPERLIPIDEMIA   HTN (hypertension)   Hypertension   BPH (benign prostatic hyperplasia)   Non Hodgkin's lymphoma, Rx'd with chemotherapy '03   AAA recent Abd [place] "[place]"   CKD (chronic kidney disease) stage 3, GFR 30-59 ml/min   PAF, was in SR 09/19/12 now A fib with RVR    Pacemaker-St.Jude, implanted June 2013   Chronic anticoagulation, [name]   Healthcare-associated pneumonia   Acute diastolic heart failure - due to PNA & Afib RVR   Acute hypoxic respiratory failure due to Healthcare-associated pneumonia/copd exacerbation/ Fluid overload:  - Improved.  -CWR with improved pulmonary infiltrates.  - Has completed his antibiotic course at this point.  -Is 4.9L negative since admission.  -Lasix decreased to 40 mg daily.   CKD (chronic kidney disease) stage 3, GFR 30-59 ml/min  - Creatinine seems to be stabilizing around his baseline of 1.4-1.6.  - ARB is on hold.   Pacemaker-St.Jude, implanted June 2013 / Atrial Fibrillation:  - On Chronic anticoagulation, [name].and amiodarone.  -Cards is following.   H/O colostomy  - Stable no signs infection.   Anemia: normocytic. Stable. No need for transfusion at this point.   Diabetes Mellitus: SSI. HGba1c is 6.7  CBG (last 3)   Recent Labs   09/28/12 1641  09/28/12 2046  09/29/12 0604   GLUCAP  134*  122*  122*       Time spent on Discharge: Greater than 30 minutes.  SignedChaya Jan [facility] Pager: [phone] 09/29/2012, 12:14 PM

## 2012-09-29 NOTE — Progress Notes (Signed)
Pt. Seen and examined. Agree with the NP/PA-C note as written.  Improved with diuresis. Now on oral lasix. Agree to decrease amiodarone to 400 mg daily. Ready for discharge. Can follow up in our office with midlevel provider in 5-7 days.  Chrystie Nose, MD, [facility] Attending Cardiologist [facility]

## 2012-10-06 ENCOUNTER — Inpatient Hospital Stay: Payer: Medicare Other | Admitting: Medical

## 2012-10-10 ENCOUNTER — Encounter: Payer: Self-pay | Admitting: Medical

## 2012-10-10 ENCOUNTER — Ambulatory Visit (INDEPENDENT_AMBULATORY_CARE_PROVIDER_SITE_OTHER): Payer: Medicare Other | Admitting: Medical

## 2012-10-10 VITALS — BP 90/50 | HR 67 | Temp 97.7°F | Resp 20 | Wt 176.0 lb

## 2012-10-10 DIAGNOSIS — E860 Dehydration: Secondary | ICD-10-CM

## 2012-10-10 DIAGNOSIS — R0602 Shortness of breath: Secondary | ICD-10-CM

## 2012-10-10 LAB — BASIC METABOLIC PANEL
BUN: 29 mg/dL — ABNORMAL HIGH (ref 6–23)
Calcium: 9 mg/dL (ref 8.4–10.5)
Glucose, Bld: 152 mg/dL — ABNORMAL HIGH (ref 70–99)

## 2012-10-10 LAB — CBC WITH DIFFERENTIAL/PLATELET
Basophils Absolute: 0 10*3/uL (ref 0.0–0.1)
Basophils Relative: 0 % (ref 0–1)
Eosinophils Absolute: 0.2 10*3/uL (ref 0.0–0.7)
HCT: 36.8 % — ABNORMAL LOW (ref 39.0–52.0)
Hemoglobin: 12.6 g/dL — ABNORMAL LOW (ref 13.0–17.0)
MCH: 29.8 pg (ref 26.0–34.0)
MCHC: 34.2 g/dL (ref 30.0–36.0)
Monocytes Absolute: 0.8 10*3/uL (ref 0.1–1.0)
Monocytes Relative: 8 % (ref 3–12)
Neutrophils Relative %: 80 % — ABNORMAL HIGH (ref 43–77)
RDW: 16.1 % — ABNORMAL HIGH (ref 11.5–15.5)

## 2012-10-10 LAB — MAGNESIUM: Magnesium: 1.6 mg/dL (ref 1.5–2.5)

## 2012-10-10 MED ORDER — METOPROLOL TARTRATE 25 MG PO TABS: 25.0000 mg | ORAL_TABLET | Freq: Two times a day (BID) | ORAL | Status: AC

## 2012-10-10 MED ORDER — FUROSEMIDE 40 MG PO TABS: 20.0000 mg | ORAL_TABLET | Freq: Every day | ORAL | Status: AC

## 2012-10-10 NOTE — Patient Instructions (Signed)
Lasix 40 mg - change to one half tablet daily.  You were taking 1 tablet daily.  Metoprolol 25 mg - change to twice a day, instead of three times a day.  Increase your water intake.  Try and drink several quarts of water daily.   For example - ice chips, sips of water, soup broth, jello, 1/2 strength Gatorade.    You should be drinking enough fluids to where your urine is clear.   Return in 2 days on Thursday with Dr. Susann Givens, or me on Friday.  We will call tomorrow with lab results.   We are going to have home health come out to evaluated what needs you have.  We are going to check overnight oxygen.

## 2012-10-10 NOTE — Progress Notes (Signed)
Subjective: Here for hospital f/u with wife Annamaria Boots.  Was just hospitalized recently for respiratory failure and pneumonia.   Hospitalized 2/7-2/14/14.  Upon discharge went to SNF for 5 days, but left as he felt they weren't adequately cleaning his colostomy bag.  He feels very weak, still SOB, can't walk but a few feet without getting out of breath.  Requests walker or wheelchair as his prior walker is broken.  He also wonders if oxygen at home could be helpful.  He denies chest pain, fever, rash, abdominal pain, NVD.  Urine output is somewhat decreased.  He is only drinking about 1-2 bottles of water daily.  He notes never prior being as fatigued and weak as before this hospitalization.  Slowly getting a little better.  Past Medical History  Diagnosis Date  . Depression   . Aneurysm of abdominal aorta     [facility], Dr. Royann Shivers  . History of colon cancer     hospitalization 1999  . Degenerative disc disease, lumbar   . History of non-Hodgkin's lymphoma     hospitalization 2002  . Cataract   . Hard of hearing     wears hearing aids  . Hypertension   . Hyperlipidemia   . Thrombocytopenia     Dr. Darnelle Catalan  . Memory loss   . Hyperglycemia   . Edema   . Heart murmur     Echocardiogram 7/10, stress test 2008, [facility]  . Overactive bladder   . Anemia     Dr. Darnelle Catalan  . AV block, 2nd degree 02/06/2012  . H/O colostomy 02/06/2012  . History of AAA (abdominal aortic aneurysm) repair 02/06/2012  . CKD (chronic kidney disease) stage 3, GFR 30-59 ml/min 02/06/2012  . Atrial fibrillation 01/2012    cardioversion to NSR  . Pacemaker 02/08/2012    [employer] DR RF dual-chamber pacemaker. Dr. Hillis Range  . Hospital-acquired pneumonia 09/2012    with respiratory distress   ROS as in subjective  Objective: Gen: pale, dehydrated/fatigued appearing, NAD Skin: pallor, decreased turgor Oral: dry mucous membranes Heart: distant heart sounds, RRR, normal s1, s2, no murmurs Lungs: not good  effort, but clear, no rhonchi, rales, wheezes Ext: no edema Pulses 1+ Skin: there is a small deer tick embedded in left hand dorsal base of thumb.  Small area of erythema at the bite site.  No other rash.  Assessment: Encounter Diagnoses  Name Primary?  . Dehydration Yes  . SOB (shortness of breath)   . Other malaise and fatigue   . Tick bite   . Impaired mobility and ADLs   . Hypoxia    Plan: Discussed case with supervising physician Dr. Susann Givens.  After reviewing hospital records and his complicated case, it seems that he left SNF prior to really being ready for discharge although his colostomy wasn't being cleaned that well.  At this point advised he cut Lasix in half to 20mg  daily, cut Metoprolol down to BID instead of TID.  discussed increased hydration. Rest.  Will set up home health for evaluation.  Will set up overnight oximetry.  Labs today.   Recheck in 2 days.  Instructions printed and reviewed with both he and wife Opal.  Cleaned and prepped left hand in usual sterile fashion.  1% lidocaine with epi for local anesthesia.  Used forceps to remove tick but small part of head remained.  Thus, used scalpel to make small incision, and ultimately removed remainder of tick from hand.   Irrigated with high  pressure saline.  Covered with clean bandage.  Hemostasis achieved.  Pt tolerated procedure well.

## 2012-10-11 ENCOUNTER — Telehealth: Payer: Self-pay | Admitting: Medical

## 2012-10-11 LAB — POCT URINALYSIS DIPSTICK
Ketones, UA: NEGATIVE
Nitrite, UA: NEGATIVE
Spec Grav, UA: 1.02
Urobilinogen, UA: NEGATIVE
pH, UA: 5

## 2012-10-11 NOTE — Telephone Encounter (Signed)
Done. I got in contact with [facility]. They are going to contact the patient in 72 hours. CLS

## 2012-10-11 NOTE — Progress Notes (Signed)
a 

## 2012-10-11 NOTE — Telephone Encounter (Signed)
Try another company

## 2012-10-12 ENCOUNTER — Ambulatory Visit: Payer: Medicare Other | Admitting: Family Medicine

## 2012-10-13 ENCOUNTER — Ambulatory Visit (INDEPENDENT_AMBULATORY_CARE_PROVIDER_SITE_OTHER): Payer: Medicare Other | Admitting: Medical

## 2012-10-14 ENCOUNTER — Inpatient Hospital Stay (HOSPITAL_COMMUNITY)
Admission: EM | Admit: 2012-10-14 | Discharge: 2012-10-17 | DRG: 312 | Disposition: A | Discharge status: Home Health | Payer: Medicare Other | Attending: Internal Medicine | Admitting: Internal Medicine

## 2012-10-14 ENCOUNTER — Emergency Department (HOSPITAL_COMMUNITY): Payer: Medicare Other

## 2012-10-14 ENCOUNTER — Observation Stay (HOSPITAL_COMMUNITY): Payer: Medicare Other

## 2012-10-14 ENCOUNTER — Encounter (HOSPITAL_COMMUNITY): Payer: Self-pay | Admitting: Radiology

## 2012-10-14 DIAGNOSIS — R410 Disorientation, unspecified: Secondary | ICD-10-CM

## 2012-10-14 DIAGNOSIS — R198 Other specified symptoms and signs involving the digestive system and abdomen: Secondary | ICD-10-CM

## 2012-10-14 DIAGNOSIS — Z87891 Personal history of nicotine dependence: Secondary | ICD-10-CM

## 2012-10-14 DIAGNOSIS — Z22322 Carrier or suspected carrier of Methicillin resistant Staphylococcus aureus: Secondary | ICD-10-CM

## 2012-10-14 DIAGNOSIS — Z79899 Other long term (current) drug therapy: Secondary | ICD-10-CM

## 2012-10-14 DIAGNOSIS — N189 Chronic kidney disease, unspecified: Secondary | ICD-10-CM

## 2012-10-14 DIAGNOSIS — E860 Dehydration: Secondary | ICD-10-CM

## 2012-10-14 DIAGNOSIS — C189 Malignant neoplasm of colon, unspecified: Secondary | ICD-10-CM

## 2012-10-14 DIAGNOSIS — R7309 Other abnormal glucose: Secondary | ICD-10-CM

## 2012-10-14 DIAGNOSIS — N401 Enlarged prostate with lower urinary tract symptoms: Secondary | ICD-10-CM

## 2012-10-14 DIAGNOSIS — Z85038 Personal history of other malignant neoplasm of large intestine: Secondary | ICD-10-CM

## 2012-10-14 DIAGNOSIS — Z933 Colostomy status: Secondary | ICD-10-CM

## 2012-10-14 DIAGNOSIS — Z7901 Long term (current) use of anticoagulants: Secondary | ICD-10-CM

## 2012-10-14 DIAGNOSIS — M51379 Other intervertebral disc degeneration, lumbosacral region without mention of lumbar back pain or lower extremity pain: Secondary | ICD-10-CM

## 2012-10-14 DIAGNOSIS — J9600 Acute respiratory failure, unspecified whether with hypoxia or hypercapnia: Secondary | ICD-10-CM

## 2012-10-14 DIAGNOSIS — IMO0002 Reserved for concepts with insufficient information to code with codable children: Secondary | ICD-10-CM

## 2012-10-14 DIAGNOSIS — R42 Dizziness and giddiness: Secondary | ICD-10-CM

## 2012-10-14 DIAGNOSIS — R413 Other amnesia: Secondary | ICD-10-CM

## 2012-10-14 DIAGNOSIS — R7989 Other specified abnormal findings of blood chemistry: Secondary | ICD-10-CM

## 2012-10-14 DIAGNOSIS — I10 Essential (primary) hypertension: Secondary | ICD-10-CM

## 2012-10-14 DIAGNOSIS — R55 Syncope and collapse: Secondary | ICD-10-CM

## 2012-10-14 DIAGNOSIS — C8590 Non-Hodgkin lymphoma, unspecified, unspecified site: Secondary | ICD-10-CM

## 2012-10-14 DIAGNOSIS — H9190 Unspecified hearing loss, unspecified ear: Secondary | ICD-10-CM | POA: Diagnosis present

## 2012-10-14 DIAGNOSIS — J189 Pneumonia, unspecified organism: Secondary | ICD-10-CM

## 2012-10-14 DIAGNOSIS — G8929 Other chronic pain: Secondary | ICD-10-CM

## 2012-10-14 DIAGNOSIS — M545 Low back pain, unspecified: Secondary | ICD-10-CM

## 2012-10-14 DIAGNOSIS — D696 Thrombocytopenia, unspecified: Secondary | ICD-10-CM

## 2012-10-14 DIAGNOSIS — F4321 Adjustment disorder with depressed mood: Secondary | ICD-10-CM

## 2012-10-14 DIAGNOSIS — N179 Acute kidney failure, unspecified: Secondary | ICD-10-CM | POA: Diagnosis present

## 2012-10-14 DIAGNOSIS — F32A Depression, unspecified: Secondary | ICD-10-CM

## 2012-10-14 DIAGNOSIS — Z87898 Personal history of other specified conditions: Secondary | ICD-10-CM

## 2012-10-14 DIAGNOSIS — H8110 Benign paroxysmal vertigo, unspecified ear: Secondary | ICD-10-CM

## 2012-10-14 DIAGNOSIS — C801 Malignant (primary) neoplasm, unspecified: Secondary | ICD-10-CM

## 2012-10-14 DIAGNOSIS — I480 Paroxysmal atrial fibrillation: Secondary | ICD-10-CM

## 2012-10-14 DIAGNOSIS — I5031 Acute diastolic (congestive) heart failure: Secondary | ICD-10-CM

## 2012-10-14 DIAGNOSIS — H269 Unspecified cataract: Secondary | ICD-10-CM

## 2012-10-14 DIAGNOSIS — Z923 Personal history of irradiation: Secondary | ICD-10-CM

## 2012-10-14 DIAGNOSIS — Z9221 Personal history of antineoplastic chemotherapy: Secondary | ICD-10-CM

## 2012-10-14 DIAGNOSIS — Z950 Presence of cardiac pacemaker: Secondary | ICD-10-CM

## 2012-10-14 DIAGNOSIS — D638 Anemia in other chronic diseases classified elsewhere: Secondary | ICD-10-CM | POA: Diagnosis present

## 2012-10-14 DIAGNOSIS — R609 Edema, unspecified: Secondary | ICD-10-CM

## 2012-10-14 DIAGNOSIS — C4490 Unspecified malignant neoplasm of skin, unspecified: Secondary | ICD-10-CM

## 2012-10-14 DIAGNOSIS — H539 Unspecified visual disturbance: Secondary | ICD-10-CM

## 2012-10-14 DIAGNOSIS — I4891 Unspecified atrial fibrillation: Secondary | ICD-10-CM

## 2012-10-14 DIAGNOSIS — N318 Other neuromuscular dysfunction of bladder: Secondary | ICD-10-CM

## 2012-10-14 DIAGNOSIS — I509 Heart failure, unspecified: Secondary | ICD-10-CM | POA: Diagnosis not present

## 2012-10-14 DIAGNOSIS — I441 Atrioventricular block, second degree: Secondary | ICD-10-CM

## 2012-10-14 DIAGNOSIS — I951 Orthostatic hypotension: Principal | ICD-10-CM | POA: Diagnosis present

## 2012-10-14 DIAGNOSIS — N183 Chronic kidney disease, stage 3 unspecified: Secondary | ICD-10-CM

## 2012-10-14 DIAGNOSIS — I129 Hypertensive chronic kidney disease with stage 1 through stage 4 chronic kidney disease, or unspecified chronic kidney disease: Secondary | ICD-10-CM | POA: Diagnosis present

## 2012-10-14 DIAGNOSIS — E291 Testicular hypofunction: Secondary | ICD-10-CM

## 2012-10-14 DIAGNOSIS — N400 Enlarged prostate without lower urinary tract symptoms: Secondary | ICD-10-CM

## 2012-10-14 DIAGNOSIS — I359 Nonrheumatic aortic valve disorder, unspecified: Secondary | ICD-10-CM | POA: Diagnosis present

## 2012-10-14 DIAGNOSIS — W19XXXA Unspecified fall, initial encounter: Secondary | ICD-10-CM

## 2012-10-14 DIAGNOSIS — I714 Abdominal aortic aneurysm, without rupture, unspecified: Secondary | ICD-10-CM

## 2012-10-14 DIAGNOSIS — E785 Hyperlipidemia, unspecified: Secondary | ICD-10-CM

## 2012-10-14 LAB — POCT I-STAT, CHEM 8
BUN: 24 mg/dL — ABNORMAL HIGH (ref 6–23)
HCT: 39 % (ref 39.0–52.0)
Sodium: 140 mEq/L (ref 135–145)
TCO2: 27 mmol/L (ref 0–100)

## 2012-10-14 LAB — CBC WITH DIFFERENTIAL/PLATELET
Eosinophils Relative: 2 % (ref 0–5)
HCT: 38.7 % — ABNORMAL LOW (ref 39.0–52.0)
Lymphocytes Relative: 10 % — ABNORMAL LOW (ref 12–46)
Lymphs Abs: 0.6 10*3/uL — ABNORMAL LOW (ref 0.7–4.0)
MCV: 88.4 fL (ref 78.0–100.0)
Monocytes Absolute: 0.6 10*3/uL (ref 0.1–1.0)
RBC: 4.38 MIL/uL (ref 4.22–5.81)
WBC: 6.7 10*3/uL (ref 4.0–10.5)

## 2012-10-14 LAB — COMPREHENSIVE METABOLIC PANEL
Albumin: 2.9 g/dL — ABNORMAL LOW (ref 3.5–5.2)
BUN: 22 mg/dL (ref 6–23)
Creatinine, Ser: 2.37 mg/dL — ABNORMAL HIGH (ref 0.50–1.35)
Potassium: 4.3 mEq/L (ref 3.5–5.1)
Total Protein: 6.7 g/dL (ref 6.0–8.3)

## 2012-10-14 LAB — POCT I-STAT TROPONIN I: Troponin i, poc: 0.05 ng/mL (ref 0.00–0.08)

## 2012-10-14 LAB — PROTIME-INR: INR: 1.34 (ref 0.00–1.49)

## 2012-10-14 MED ORDER — AMIODARONE HCL 200 MG PO TABS
400.0000 mg | ORAL_TABLET | Freq: Every day | ORAL | Status: DC
  Administered 2012-10-15 – 2012-10-17 (×3): 400 mg via ORAL
  Filled 2012-10-14 (×3): qty 2

## 2012-10-14 MED ORDER — OXYCODONE HCL 5 MG PO TABS: 5.0000 mg | ORAL_TABLET | ORAL | Status: DC | PRN

## 2012-10-14 MED ORDER — SODIUM CHLORIDE 0.9 % IJ SOLN: 3.0000 mL | Freq: Two times a day (BID) | INTRAMUSCULAR | Status: DC

## 2012-10-14 MED ORDER — ACETAMINOPHEN 325 MG PO TABS: 650.0000 mg | ORAL_TABLET | Freq: Four times a day (QID) | ORAL | Status: DC | PRN

## 2012-10-14 MED ORDER — NIACIN ER 500 MG PO CPCR
500.0000 mg | ORAL_CAPSULE | Freq: Every day | ORAL | Status: DC
  Administered 2012-10-15 – 2012-10-17 (×3): 500 mg via ORAL
  Filled 2012-10-14 (×4): qty 1

## 2012-10-14 MED ORDER — ONDANSETRON HCL 4 MG/2ML IJ SOLN: 4.0000 mg | Freq: Four times a day (QID) | INTRAMUSCULAR | Status: DC | PRN

## 2012-10-14 MED ORDER — DIGOXIN 125 MCG PO TABS
0.1250 mg | ORAL_TABLET | Freq: Every day | ORAL | Status: DC
  Administered 2012-10-15 – 2012-10-17 (×3): 0.125 mg via ORAL
  Filled 2012-10-14 (×4): qty 1

## 2012-10-14 MED ORDER — ACETAMINOPHEN 650 MG RE SUPP: 650.0000 mg | Freq: Four times a day (QID) | RECTAL | Status: DC | PRN

## 2012-10-14 MED ORDER — PANTOPRAZOLE SODIUM 40 MG PO TBEC
40.0000 mg | DELAYED_RELEASE_TABLET | Freq: Every day | ORAL | Status: DC
  Administered 2012-10-15 – 2012-10-16 (×2): 40 mg via ORAL
  Filled 2012-10-14: qty 1

## 2012-10-14 MED ORDER — TAMSULOSIN HCL 0.4 MG PO CAPS
0.4000 mg | ORAL_CAPSULE | Freq: Every day | ORAL | Status: DC
  Administered 2012-10-15 – 2012-10-17 (×3): 0.4 mg via ORAL
  Filled 2012-10-14 (×3): qty 1

## 2012-10-14 MED ORDER — SODIUM CHLORIDE 0.9 % IV SOLN
INTRAVENOUS | Status: DC
  Administered 2012-10-15 – 2012-10-17 (×8): via INTRAVENOUS

## 2012-10-14 MED ORDER — ESCITALOPRAM OXALATE 20 MG PO TABS
20.0000 mg | ORAL_TABLET | Freq: Every day | ORAL | Status: DC
  Administered 2012-10-15 – 2012-10-17 (×3): 20 mg via ORAL
  Filled 2012-10-14 (×3): qty 1
  Filled 2012-10-14: qty 2

## 2012-10-14 MED ORDER — MORPHINE SULFATE 2 MG/ML IJ SOLN: 2.0000 mg | INTRAMUSCULAR | Status: DC | PRN

## 2012-10-14 MED ORDER — METOPROLOL TARTRATE 25 MG PO TABS
25.0000 mg | ORAL_TABLET | Freq: Two times a day (BID) | ORAL | Status: DC
  Administered 2012-10-15 – 2012-10-17 (×6): 25 mg via ORAL
  Filled 2012-10-14 (×6): qty 1

## 2012-10-14 MED ORDER — APIXABAN 2.5 MG PO TABS
2.5000 mg | ORAL_TABLET | Freq: Two times a day (BID) | ORAL | Status: DC
  Administered 2012-10-15 – 2012-10-17 (×5): 2.5 mg via ORAL
  Filled 2012-10-14 (×6): qty 1

## 2012-10-14 MED ORDER — POLYETHYLENE GLYCOL 3350 17 G PO PACK
17.0000 g | PACK | Freq: Every day | ORAL | Status: DC | PRN
  Filled 2012-10-14: qty 1

## 2012-10-14 MED ORDER — SODIUM CHLORIDE 0.9 % IV SOLN: 250.0000 mL | INTRAVENOUS | Status: DC | PRN

## 2012-10-14 MED ORDER — ONDANSETRON HCL 4 MG PO TABS: 4.0000 mg | ORAL_TABLET | Freq: Four times a day (QID) | ORAL | Status: DC | PRN

## 2012-10-14 MED ORDER — SODIUM CHLORIDE 0.9 % IJ SOLN: 3.0000 mL | INTRAMUSCULAR | Status: DC | PRN

## 2012-10-14 NOTE — H&P (Signed)
PCP:   Carollee Herter, MD   Chief Complaint:  Syncopal episode.   HPI: This is an 79 year old male,with known history of Depression, dyslipidemia, CKD-3, baseline creatinine of 1.4-1.6, AAA s/p repair, s/p bilateral cataract surgery, deafness, HTN, atrial fibrillation/2nd degree AV block, s/p PPM 09/2011, on chronic anticoagulation with Apixiban, lumbar DJD, history of colon cancer 1999 s/p surgery/colostomy, XRT & Chemotherapy, history of non-Hodgkin lymphoma 2002. He is s/p hospitalization from 2/7/1-2/14/14 for bilateral pneumonia, following which [facility]. While at [facility], he became progressively weak, and was seen on 10/10/22 at Dr Jac Canavan office, where his Lasix dose was halved, and he was encouraged to drink more fluids. A tick was removed from the back of his left hand on that date. On 10/11/12, he felt much better, but has since become gradually weaker, with poor appetite, but no cough, chest pain, fever or chills. On 10/14/12, he woke up feeling a bit "wobbly" At about 2:00 Pm, he went to the bathroom to shower, sitting on a shower bench. After his shower, he dried off, felt nauseated, had a bout 4-5 dry heaves, but no vomiting. He then got up, felt dizzy and fell. Fortunately, he only hit his head on a rubber trash can. His son was in the house, and saw him fall. There was no loss of consciousness. Son helped patient up, they started to walk back towards the bedroom, and he suddenly became limp. Son caught him before he could fall. He appeared confused but awake, and had no involuntary movements. EMS was called, and by the time patient arrived in the ED, he was asymptomatic.   Allergies:  No Known Allergies    Past Medical History  Diagnosis Date  . Depression   . Aneurysm of abdominal aorta     [facility], Dr. Royann Shivers  . History of colon cancer     hospitalization 1999  . Degenerative disc disease, lumbar   . History of non-Hodgkin's lymphoma    hospitalization 2002  . Cataract   . Hard of hearing     wears hearing aids  . Hypertension   . Hyperlipidemia   . Thrombocytopenia     Dr. Darnelle Catalan  . Memory loss   . Hyperglycemia   . Edema   . Heart murmur     Echocardiogram 7/10, stress test 2008, [facility]  . Overactive bladder   . Anemia     Dr. Darnelle Catalan  . AV block, 2nd degree 02/06/2012  . H/O colostomy 02/06/2012  . History of AAA (abdominal aortic aneurysm) repair 02/06/2012  . CKD (chronic kidney disease) stage 3, GFR 30-59 ml/min 02/06/2012  . Atrial fibrillation 01/2012    cardioversion to NSR  . Pacemaker 02/08/2012    [employer] DR RF dual-chamber pacemaker. Dr. Hillis Range  . Hospital-acquired pneumonia 09/2012    with respiratory distress    Past Surgical History  Procedure Laterality Date  . Colostomy    . Cardioversion  02/09/2012    Procedure: CARDIOVERSION;  Surgeon: Chrystie Nose, MD;  Location: [facility] OR;  Service: Cardiovascular;  Laterality: N/A;  . Pacemaker placement  02/08/2012    Dr. Hillis Range ([facility])  . Cataract extraction, bilateral      Prior to Admission medications   Medication Sig Start Date End Date Taking? Authorizing Provider  amiodarone (PACERONE) 200 MG tablet Take 2 tablets (400 mg total) by mouth daily. 09/29/12   Henderson Cloud, MD  apixaban Everlene Balls)  5 MG TABS tablet Take 0.5 tablets (2.5 mg total) by mouth 2 (two) times daily. 09/22/12   Jac Canavan, PA  digoxin (LANOXIN) 0.125 MG tablet Take 1 tablet (0.125 mg total) by mouth daily. 09/29/12   Henderson Cloud, MD  escitalopram (LEXAPRO) 20 MG tablet Take 1 tablet (20 mg total) by mouth daily. 06/16/12   Ronnald Nian, MD  furosemide (LASIX) 40 MG tablet Take 0.5 tablets (20 mg total) by mouth daily. 10/10/12   Jac Canavan, PA  metoprolol tartrate (LOPRESSOR) 25 MG tablet Take 1 tablet (25 mg total) by mouth 2 (two) times daily. 10/10/12   Jac Canavan, PA  niacin (NIASPAN) 500 MG CR tablet Take 1  tablet (500 mg total) by mouth at bedtime. 03/27/12   Ronnald Nian, MD  pantoprazole (PROTONIX) 40 MG tablet Take 1 tablet (40 mg total) by mouth daily at 12 noon. 07/10/12   Abelino Derrick, PA  polyethylene glycol [facility] / GLYCOLAX) packet Take 17 g by mouth daily as needed. 09/29/12   Henderson Cloud, MD  potassium chloride SA (K-DUR,KLOR-CON) 20 MEQ tablet Take 1 tablet (20 mEq total) by mouth daily. 09/29/12   Henderson Cloud, MD  silodosin (RAPAFLO) 8 MG CAPS capsule Take 1 capsule (8 mg total) by mouth every morning. 08/17/12   Ronnald Nian, MD    Social History: Patient reports that he quit smoking about 45 years ago. His smoking use included Cigarettes. He smoked 2.00 packs per day. He has never used smokeless tobacco. He reports that he does not drink alcohol or use illicit drugs.  Family History  Problem Relation Age of Onset  . Other Mother   . Other Father     Review of Systems:  As per HPI and chief complaint. Patent has fatigue, diminished appetite, no weight loss, no fever, chills, headache, blurred vision, difficulty in speaking, dysphagia, chest pain or cough. He does get short of breath on ambulation, but no orthopnea, paroxysmal nocturnal dyspnea, diaphoresis, abdominal pain, vomiting, diarrhea, belching, heartburn, hematemesis, melena, dysuria, nocturia, urinary frequency, hematochezia, lower extremity swelling, pain, or redness. He continues to be troubled by worsening low back pain. The rest of the systems review is negative.  Physical Exam:  General:  Patient does not appear to be in obvious acute distress. Alert, communicative, fully oriented, talking in complete sentences, not short of breath at rest.  HEENT:  No clinical pallor, no jaundice, no conjunctival injection or discharge. Buccal mucosa is "dry".  NECK:  Supple, JVP not seen, no carotid bruits, no palpable lymphadenopathy, no palpable goiter. CHEST:  No wheezes, has fine crackles in the  left base. HEART:  Sounds 1 and 2 heard, normal, regular, no murmurs. ABDOMEN:  Moderately obese, soft, non-tender. Colostomy flange/bag is noted in the RLQ and appears unremarkable. No palpable organomegaly, no palpable masses, normal bowel sounds. GENITALIA:  Not examined. LOWER EXTREMITIES:  No pitting edema, palpable peripheral pulses. MUSCULOSKELETAL SYSTEM:  Generalized osteoarthritic changes, otherwise, normal. CENTRAL NERVOUS SYSTEM:  No focal neurologic deficit on gross examination.  Labs on Admission:  Results for orders placed during the hospital encounter of 10/14/12 (from the past 48 hour(s))  COMPREHENSIVE METABOLIC PANEL     Status: Abnormal   Collection Time    10/14/12  5:39 PM      Result Value Range   Sodium 138  135 - 145 mEq/L   Potassium 4.3  3.5 - 5.1 mEq/L  Chloride 99  96 - 112 mEq/L   CO2 26  19 - 32 mEq/L   Glucose, Bld 170 (*) 70 - 99 mg/dL   BUN 22  6 - 23 mg/dL   Creatinine, Ser 8.58 (*) 0.50 - 1.35 mg/dL   Calcium 9.3  8.4 - 85.0 mg/dL   Total Protein 6.7  6.0 - 8.3 g/dL   Albumin 2.9 (*) 3.5 - 5.2 g/dL   AST 26  0 - 37 U/L   ALT 19  0 - 53 U/L   Alkaline Phosphatase 92  39 - 117 U/L   Total Bilirubin 0.4  0.3 - 1.2 mg/dL   GFR calc non Af Amer 23 (*) >90 mL/min   GFR calc Af Amer 27 (*) >90 mL/min   Comment:            The eGFR has been calculated     using the CKD EPI equation.     This calculation has not been     validated in all clinical     situations.     eGFR's persistently     <90 mL/min signify     possible Chronic Kidney Disease.  CBC WITH DIFFERENTIAL     Status: Abnormal   Collection Time    10/14/12  5:39 PM      Result Value Range   WBC 6.7  4.0 - 10.5 K/uL   RBC 4.38  4.22 - 5.81 MIL/uL   Hemoglobin 13.1  13.0 - 17.0 g/dL   HCT 27.7 (*) 41.2 - 87.8 %   MCV 88.4  78.0 - 100.0 fL   MCH 29.9  26.0 - 34.0 pg   MCHC 33.9  30.0 - 36.0 g/dL   RDW 67.6 (*) 72.0 - 94.7 %   Platelets 140 (*) 150 - 400 K/uL   Neutrophils  Relative 80 (*) 43 - 77 %   Neutro Abs 5.4  1.7 - 7.7 K/uL   Lymphocytes Relative 10 (*) 12 - 46 %   Lymphs Abs 0.6 (*) 0.7 - 4.0 K/uL   Monocytes Relative 9  3 - 12 %   Monocytes Absolute 0.6  0.1 - 1.0 K/uL   Eosinophils Relative 2  0 - 5 %   Eosinophils Absolute 0.1  0.0 - 0.7 K/uL   Basophils Relative 0  0 - 1 %   Basophils Absolute 0.0  0.0 - 0.1 K/uL  PROTIME-INR     Status: Abnormal   Collection Time    10/14/12  5:39 PM      Result Value Range   Prothrombin Time 16.3 (*) 11.6 - 15.2 seconds   INR 1.34  0.00 - 1.49  PRO B NATRIURETIC PEPTIDE     Status: Abnormal   Collection Time    10/14/12  5:40 PM      Result Value Range   Pro B Natriuretic peptide (BNP) 5496.0 (*) 0 - 450 pg/mL  POCT I-STAT TROPONIN I     Status: None   Collection Time    10/14/12  6:04 PM      Result Value Range   Troponin i, poc 0.05  0.00 - 0.08 ng/mL   Comment 3            Comment: Due to the release kinetics of cTnI,     a negative result within the first hours     of the onset of symptoms does not rule out     myocardial infarction with certainty.  If myocardial infarction is still suspected,     repeat the test at appropriate intervals.  POCT I-STAT, CHEM 8     Status: Abnormal   Collection Time    10/14/12  6:06 PM      Result Value Range   Sodium 140  135 - 145 mEq/L   Potassium 4.2  3.5 - 5.1 mEq/L   Chloride 103  96 - 112 mEq/L   BUN 24 (*) 6 - 23 mg/dL   Creatinine, Ser 4.09 (*) 0.50 - 1.35 mg/dL   Glucose, Bld 811 (*) 70 - 99 mg/dL   Calcium, Ion 9.14 (*) 1.13 - 1.30 mmol/L   TCO2 27  0 - 100 mmol/L   Hemoglobin 13.3  13.0 - 17.0 g/dL   HCT 78.2  95.6 - 21.3 %    Radiological Exams on Admission: Dg Chest Portable 1 View  10/14/2012  *RADIOLOGY REPORT*  Clinical Data: 79 year old male weakness, near-syncope.  PORTABLE CHEST - 1 VIEW  Comparison: 09/27/2012 and earlier.  Findings: AP portable semi upright view 1800 hours.  More lordotic view.  Stable cardiac size and  mediastinal contours.  Left chest cardiac pacemaker.  No pneumothorax.  No pleural effusion.  Mild residual patchy opacity in the right upper lobe and left lung base, improved since 09/24/2012.  No areas of worsening ventilation identified.  IMPRESSION: Mild residual patchy opacity in the right upper lobe and left lung base, but decreased since 09/24/2012.   Original Report Authenticated By: Erskine Speed, M.D.     Assessment/Plan Active Problems:   1. Pre-syncope: Patient presented following an episode of postural dizziness, preceded by progressive weakness and poor oral intake, then culminating in fall. Symptoms recurred on getting up again. Clinically, he appears dehydrated, so the culprit is likely to be postural hypotension, due to volume depletion in the setting of poor oral intake, continuing diuretic and anti-hypertensive therapy. His 2D echocardiogram of 09/24/12, revealed mild to moderate aortic stenosis, Valve area: 1.47cm^2(VTI), EF 55%-60%. We shall rehydrate, hold diuretics, cycle cardiac enzymes, and consult Dr Bonney Leitz al, for pace-maker check, although 12-lead EKG showed paced complexes. Dr Royann Shivers et al, will be consulted. Patient does not appear to have sustained any visible trauma, during his fall, but had hit his head on a rubber trash can. He has no focal neurology deficit at this time, but as he is on anticoagulation, will check head CT scan to exclude SDH.  2. Dehydration/Renal failure (ARF), acute on chronic: Patient has known CKD-3, with  baseline creatinine of 1.4-1.6. At presentation, creatinine is 2.30, and patient looked clinically dehydrated. Will manage as described in #1 above, with iv fluids, avoid nephrotoxins and hold diuretics. Following renal indices.  3. 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 EKG and monitor have revealed paced complexes. Per patient, he is  currently on an event monitor. Will defer to EPS to check whether any events occurred, during his pre-syncopal episode.  4. Malignancy: History of colon cancer 1999 s/p surgery/colostomy, XRT/chemotherapy and history of non-Hodgkin lymphoma 2002. Patient follows up with Dr Darnelle Catalan, and appears stable.  5. Low back pain: This is chronic, and due to DJD, but per patient, has become really troublesome lately. Will evaluate with Lumbar CT scan and utilize analgesics.   Further management will depend on clinical course.   Comment; Patient is FULL CODE.    Time Spent on Admission: 1 hour.  Fredricka Kohrs,CHRISTOPHER 10/14/2012, 7:34 PM

## 2012-10-14 NOTE — ED Notes (Signed)
Condition is acute worse with exertion. Better nothing. Worse nothing. Pt denies any recent illness other than PNA, fever or pain with urination

## 2012-10-14 NOTE — ED Notes (Signed)
Pt presents with weakness X 1 week.  and near syncopal episode.  Pt was released from this facility last week r.t pna

## 2012-10-15 LAB — URINALYSIS, ROUTINE W REFLEX MICROSCOPIC
Bilirubin Urine: NEGATIVE
Glucose, UA: NEGATIVE mg/dL
Ketones, ur: NEGATIVE mg/dL
Leukocytes, UA: NEGATIVE
pH: 5 (ref 5.0–8.0)

## 2012-10-15 LAB — CBC
Hemoglobin: 12.3 g/dL — ABNORMAL LOW (ref 13.0–17.0)
Hemoglobin: 12.5 g/dL — ABNORMAL LOW (ref 13.0–17.0)
MCH: 30.1 pg (ref 26.0–34.0)
MCH: 29.8 pg (ref 26.0–34.0)
MCHC: 33.7 g/dL (ref 30.0–36.0)
MCV: 89.2 fL (ref 78.0–100.0)
Platelets: 131 10*3/uL — ABNORMAL LOW (ref 150–400)
Platelets: 123 10*3/uL — ABNORMAL LOW (ref 150–400)
RBC: 4.09 MIL/uL — ABNORMAL LOW (ref 4.22–5.81)
RBC: 4.2 MIL/uL — ABNORMAL LOW (ref 4.22–5.81)
WBC: 6.8 10*3/uL (ref 4.0–10.5)

## 2012-10-15 LAB — URINE MICROSCOPIC-ADD ON

## 2012-10-15 LAB — BASIC METABOLIC PANEL
CO2: 30 mEq/L (ref 19–32)
Calcium: 9 mg/dL (ref 8.4–10.5)
Calcium: 9.2 mg/dL (ref 8.4–10.5)
Creatinine, Ser: 2.67 mg/dL — ABNORMAL HIGH (ref 0.50–1.35)
GFR calc Af Amer: 25 mL/min — ABNORMAL LOW (ref >90)
GFR calc non Af Amer: 20 mL/min — ABNORMAL LOW (ref >90)
GFR calc non Af Amer: 22 mL/min — ABNORMAL LOW (ref >90)
Glucose, Bld: 113 mg/dL — ABNORMAL HIGH (ref 70–99)
Potassium: 3.9 mEq/L (ref 3.5–5.1)
Sodium: 141 mEq/L (ref 135–145)
Sodium: 139 mEq/L (ref 135–145)

## 2012-10-15 NOTE — Evaluation (Signed)
Physical Therapy Evaluation Patient Details Name: [name] [name] MRN: [id number] DOB: 09-15-1921 Today's Date: 10/15/2012 Time: 4098-1191 PT Time Calculation (min): 18 min  PT Assessment / Plan / Recommendation Clinical Impression  Pt admitted with pre-syncope at home. Pt has orthostatic hypotension this morning. Pt pivoted to recliner from bed with min A. He would benefit from acute PT to maximize safety and independence with mobility.     PT Assessment  Patient needs continued PT services    Follow Up Recommendations  Home health PT    Does the patient have the potential to tolerate intense rehabilitation      Barriers to Discharge None      Equipment Recommendations  Rolling walker with 5" wheels    Recommendations for Other Services     Frequency Min 3X/week    Precautions / Restrictions Precautions Precautions: Fall Precaution Comments: watch O2 sats Restrictions Weight Bearing Restrictions: No   Pertinent Vitals/Pain **HR 70 at rest and after activity*      Mobility  Bed Mobility Bed Mobility: Supine to Sit Rolling Right: 4: Min assist Details for Bed Mobility Assistance: min A to elevate trunk, pt 75% Transfers Transfers: Sit to Stand;Stand to Sit Sit to Stand: 4: Min guard;From bed Stand to Sit: 4: Min guard;To chair/3-in-1;With upper extremity assist;With armrests Stand Pivot Transfers: 4: Min guard;With armrests Details for Transfer Assistance: pt fatigued after SPT, min/guard for safety 2* orthostatic hypotension and recent fall. Pt denied dizziness in standing.  Ambulation/Gait Ambulation/Gait Assistance: Not tested (comment) (NT secondary to orthostatic hypotension this morning)    Exercises     PT Diagnosis: Generalized weakness  PT Problem List: Decreased activity tolerance;Decreased mobility PT Treatment Interventions: Gait training;DME instruction;Therapeutic activities;Therapeutic exercise;Patient/family education   PT Goals Acute Rehab  PT Goals PT Goal Formulation: With patient Time For Goal Achievement: 10/29/12 Potential to Achieve Goals: Good Pt will go Supine/Side to Sit: with modified independence;with HOB 0 degrees PT Goal: Supine/Side to Sit - Progress: Goal set today Pt will go Sit to Supine/Side: with modified independence;with HOB 0 degrees PT Goal: Sit to Supine/Side - Progress: Goal set today Pt will go Sit to Stand: with modified independence PT Goal: Sit to Stand - Progress: Goal set today Pt will go Stand to Sit: with modified independence PT Goal: Stand to Sit - Progress: Goal set today Pt will Ambulate: 51 - 150 feet;with supervision;with rolling walker PT Goal: Ambulate - Progress: Goal set today Pt will Go Up / Down Stairs: 1-2 stairs;with min assist;with least restrictive assistive device PT Goal: Up/Down Stairs - Progress: Goal set today  Visit Information  Last PT Received On: 10/15/12 Assistance Needed: +1    Subjective Data  Subjective: I'm just weak. I've lost 25 pounds.  Patient Stated Goal: just to do the normal things   Prior Functioning  Home Living Lives With: Significant other Available Help at Discharge: Family;Available PRN/intermittently Type of Home: House Home Access: Stairs to enter [employer] of Steps: 1 Entrance Stairs-Rails: None Home Layout: One level Firefighter: Standard Bathroom Accessibility: Yes How Accessible: Accessible via walker Home Adaptive Equipment: Straight cane;Shower chair with back Prior Function Level of Independence: Needs assistance Needs Assistance: Light Housekeeping;Meal Prep Meal Prep: Moderate Light Housekeeping: Maximal Able to Take Stairs?: Yes Driving: Yes Vocation: Retired Musician: HOH;Other (comment) (bil hearing aids)    Cognition  Cognition Overall Cognitive Status: Appears within functional limits for tasks assessed/performed Arousal/Alertness: Awake/alert Orientation Level: Appears  intact for tasks assessed Behavior During  Session: [facility] for tasks performed    Extremity/Trunk Assessment Right Upper Extremity Assessment RUE ROM/Strength/Tone: [facility] for tasks assessed Left Upper Extremity Assessment LUE ROM/Strength/Tone: [facility] for tasks assessed Right Lower Extremity Assessment RLE ROM/Strength/Tone: [facility] for tasks assessed Left Lower Extremity Assessment LLE ROM/Strength/Tone: WFL for tasks assessed Trunk Assessment Trunk Assessment: Normal   Balance Static Sitting Balance Static Sitting - Balance Support: No upper extremity supported;Feet supported Static Sitting - Level of Assistance: 6: Modified independent (Device/Increase time) Static Sitting - Comment/# of Minutes: 1 Dynamic Standing Balance Dynamic Standing - Balance Activities:  (grooming)  End of Session PT - End of Session Equipment Utilized During Treatment: Oxygen Activity Tolerance: Patient limited by fatigue Patient left: in chair;with call bell/phone within reach Nurse Communication: Mobility status  GP Functional Assessment Tool Used: clinical judgement Functional Limitation: Mobility: Walking and moving around Mobility: Walking and Moving Around Current Status (O1308): At least 20 percent but less than 40 percent impaired, limited or restricted Mobility: Walking and Moving Around Goal Status [phone]): At least 1 percent but less than 20 percent impaired, limited or restricted   Tamala Ser 10/15/2012, 9:28 AM [phone]

## 2012-10-15 NOTE — Consult Note (Signed)
Reason for Consult:  History of Afib Referring Physician:   [name] is an 79 y.o. male.  HPI:  Pt has history of PAF, on amiodarone. The first of Jan. He was in SR with PACs but by 09/14/12 he was in a. Fib. Arrangements were made for DCCV (pt on chronic anticoagulation with eliquis) but on arrival to Short Stay for cardioversion on Feb. 4, 2014 he was in SR. Just prior to this pt was placed on Amiodarone for 200 mg BID for 1 week the 200 mg daily.  The patient was just hospitalized from 2/7-2/14 for PNA and was in A fib with RVR.  His history also includes HTN, HLD, CKD, second degree AVB with PPM placement([employer]).  Nuc study in 2008 low risk. Echo in 2010 with good LV function and mod. aortic sclerosis.  On follow up office visits pacemaker interrogation have revealed PAF.  The patient presented yesterday after falling while getting out of the shower.  He states he was not dizzy but may have lost his balance.  His head hit a plastic trash can, on the right occipital area, which prevented it from hitting the floor.  He also reported nausea and vomiting at some point while in the shower.   He says he did not pass out.   He also reports DOE and lower UOP yesterday but denies CP, orthopnea, LEE, ABD pain, fever.  His PCP recent decreased his lasix to 20mg  daily.   Past Medical History  Diagnosis Date  . Depression   . Aneurysm of abdominal aorta     [facility], Dr. Royann Shivers  . History of colon cancer     hospitalization 1999  . Degenerative disc disease, lumbar   . History of non-Hodgkin's lymphoma     hospitalization 2002  . Cataract   . Hard of hearing     wears hearing aids  . Hypertension   . Hyperlipidemia   . Thrombocytopenia     Dr. Darnelle Catalan  . Memory loss   . Hyperglycemia   . Edema   . Heart murmur     Echocardiogram 7/10, stress test 2008, [facility]  . Overactive bladder   . Anemia     Dr. Darnelle Catalan  . AV block, 2nd degree 02/06/2012  . H/O colostomy 02/06/2012  . History of  AAA (abdominal aortic aneurysm) repair 02/06/2012  . CKD (chronic kidney disease) stage 3, GFR 30-59 ml/min 02/06/2012  . Atrial fibrillation 01/2012    cardioversion to NSR  . Pacemaker 02/08/2012    [employer] DR RF dual-chamber pacemaker. Dr. Hillis Range  . Hospital-acquired pneumonia 09/2012    with respiratory distress    Past Surgical History  Procedure Laterality Date  . Colostomy    . Cardioversion  02/09/2012    Procedure: CARDIOVERSION;  Surgeon: Chrystie Nose, MD;  Location: [facility] OR;  Service: Cardiovascular;  Laterality: N/A;  . Pacemaker placement  02/08/2012    Dr. Hillis Range ([facility])  . Cataract extraction, bilateral      Family History  Problem Relation Age of Onset  . Other Mother   . Other Father     Social History:  reports that he quit smoking about 45 years ago. His smoking use included Cigarettes. He smoked 2.00 packs per day. He has never used smokeless tobacco. He reports that he does not drink alcohol or use illicit drugs.  Allergies: No Known Allergies  Medications(Home)  Prior to Admission medications   Medication Sig Start  Date End Date Taking? Authorizing Raymont Andreoni  amiodarone (PACERONE) 200 MG tablet Take 2 tablets (400 mg total) by mouth daily. 09/29/12  Yes Estela Isaiah Blakes, MD  apixaban (ELIQUIS) 5 MG TABS tablet Take 0.5 tablets (2.5 mg total) by mouth 2 (two) times daily. 09/22/12  Yes Kermit Balo Tysinger, PA  digoxin (LANOXIN) 0.125 MG tablet Take 1 tablet (0.125 mg total) by mouth daily. 09/29/12  Yes Henderson Cloud, MD  escitalopram (LEXAPRO) 20 MG tablet Take 1 tablet (20 mg total) by mouth daily. 06/16/12  Yes Ronnald Nian, MD  furosemide (LASIX) 40 MG tablet Take 0.5 tablets (20 mg total) by mouth daily. 10/10/12  Yes Kermit Balo Tysinger, PA  metoprolol tartrate (LOPRESSOR) 25 MG tablet Take 1 tablet (25 mg total) by mouth 2 (two) times daily. 10/10/12  Yes Kermit Balo Tysinger, PA  niacin (NIASPAN) 500 MG CR tablet Take 1  tablet (500 mg total) by mouth at bedtime. 03/27/12  Yes Ronnald Nian, MD  pantoprazole (PROTONIX) 40 MG tablet Take 1 tablet (40 mg total) by mouth daily at 12 noon. 07/10/12  Yes Abelino Derrick, PA  polyethylene glycol (MIRALAX / GLYCOLAX) packet Take 17 g by mouth daily as needed. 09/29/12  Yes Estela Isaiah Blakes, MD  potassium chloride SA (K-DUR,KLOR-CON) 20 MEQ tablet Take 1 tablet (20 mEq total) by mouth daily. 09/29/12  Yes Estela Isaiah Blakes, MD  silodosin (RAPAFLO) 8 MG CAPS capsule Take 1 capsule (8 mg total) by mouth every morning. 08/17/12  Yes Ronnald Nian, MD     Results for orders placed during the hospital encounter of 10/14/12 (from the past 48 hour(s))  COMPREHENSIVE METABOLIC PANEL     Status: Abnormal   Collection Time    10/14/12  5:39 PM      Result Value Range   Sodium 138  135 - 145 mEq/L   Potassium 4.3  3.5 - 5.1 mEq/L   Chloride 99  96 - 112 mEq/L   CO2 26  19 - 32 mEq/L   Glucose, Bld 170 (*) 70 - 99 mg/dL   BUN 22  6 - 23 mg/dL   Creatinine, Ser 1.91 (*) 0.50 - 1.35 mg/dL   Calcium 9.3  8.4 - 47.8 mg/dL   Total Protein 6.7  6.0 - 8.3 g/dL   Albumin 2.9 (*) 3.5 - 5.2 g/dL   AST 26  0 - 37 U/L   ALT 19  0 - 53 U/L   Alkaline Phosphatase 92  39 - 117 U/L   Total Bilirubin 0.4  0.3 - 1.2 mg/dL   GFR calc non Af Amer 23 (*) >90 mL/min   GFR calc Af Amer 27 (*) >90 mL/min   Comment:            The eGFR has been calculated     using the CKD EPI equation.     This calculation has not been     validated in all clinical     situations.     eGFR's persistently     <90 mL/min signify     possible Chronic Kidney Disease.  CBC WITH DIFFERENTIAL     Status: Abnormal   Collection Time    10/14/12  5:39 PM      Result Value Range   WBC 6.7  4.0 - 10.5 K/uL   RBC 4.38  4.22 - 5.81 MIL/uL   Hemoglobin 13.1  13.0 - 17.0 g/dL   HCT  38.7 (*) 39.0 - 52.0 %   MCV 88.4  78.0 - 100.0 fL   MCH 29.9  26.0 - 34.0 pg   MCHC 33.9  30.0 - 36.0 g/dL   RDW 16.1  (*) 09.6 - 15.5 %   Platelets 140 (*) 150 - 400 K/uL   Neutrophils Relative 80 (*) 43 - 77 %   Neutro Abs 5.4  1.7 - 7.7 K/uL   Lymphocytes Relative 10 (*) 12 - 46 %   Lymphs Abs 0.6 (*) 0.7 - 4.0 K/uL   Monocytes Relative 9  3 - 12 %   Monocytes Absolute 0.6  0.1 - 1.0 K/uL   Eosinophils Relative 2  0 - 5 %   Eosinophils Absolute 0.1  0.0 - 0.7 K/uL   Basophils Relative 0  0 - 1 %   Basophils Absolute 0.0  0.0 - 0.1 K/uL  PROTIME-INR     Status: Abnormal   Collection Time    10/14/12  5:39 PM      Result Value Range   Prothrombin Time 16.3 (*) 11.6 - 15.2 seconds   INR 1.34  0.00 - 1.49  PRO B NATRIURETIC PEPTIDE     Status: Abnormal   Collection Time    10/14/12  5:40 PM      Result Value Range   Pro B Natriuretic peptide (BNP) 5496.0 (*) 0 - 450 pg/mL  POCT I-STAT TROPONIN I     Status: None   Collection Time    10/14/12  6:04 PM      Result Value Range   Troponin i, poc 0.05  0.00 - 0.08 ng/mL   Comment 3            Comment: Due to the release kinetics of cTnI,     a negative result within the first hours     of the onset of symptoms does not rule out     myocardial infarction with certainty.     If myocardial infarction is still suspected,     repeat the test at appropriate intervals.  POCT I-STAT, CHEM 8     Status: Abnormal   Collection Time    10/14/12  6:06 PM      Result Value Range   Sodium 140  135 - 145 mEq/L   Potassium 4.2  3.5 - 5.1 mEq/L   Chloride 103  96 - 112 mEq/L   BUN 24 (*) 6 - 23 mg/dL   Creatinine, Ser 0.45 (*) 0.50 - 1.35 mg/dL   Glucose, Bld 409 (*) 70 - 99 mg/dL   Calcium, Ion 8.11 (*) 1.13 - 1.30 mmol/L   TCO2 27  0 - 100 mmol/L   Hemoglobin 13.3  13.0 - 17.0 g/dL   HCT 91.4  78.2 - 95.6 %  TROPONIN I     Status: None   Collection Time    10/14/12  8:22 PM      Result Value Range   Troponin I <0.30  <0.30 ng/mL   Comment:            Due to the release kinetics of cTnI,     a negative result within the first hours     of the onset  of symptoms does not rule out     myocardial infarction with certainty.     If myocardial infarction is still suspected,     repeat the test at appropriate intervals.  TROPONIN I     Status: None  Collection Time    10/15/12  1:55 AM      Result Value Range   Troponin I <0.30  <0.30 ng/mL   Comment:            Due to the release kinetics of cTnI,     a negative result within the first hours     of the onset of symptoms does not rule out     myocardial infarction with certainty.     If myocardial infarction is still suspected,     repeat the test at appropriate intervals.  CBC     Status: Abnormal   Collection Time    10/15/12  4:50 AM      Result Value Range   WBC 7.9  4.0 - 10.5 K/uL   RBC 4.09 (*) 4.22 - 5.81 MIL/uL   Hemoglobin 12.3 (*) 13.0 - 17.0 g/dL   HCT 16.1 (*) 09.6 - 04.5 %   MCV 89.2  78.0 - 100.0 fL   MCH 30.1  26.0 - 34.0 pg   MCHC 33.7  30.0 - 36.0 g/dL   RDW 40.9 (*) 81.1 - 91.4 %   Platelets 131 (*) 150 - 400 K/uL  BASIC METABOLIC PANEL     Status: Abnormal   Collection Time    10/15/12  4:50 AM      Result Value Range   Sodium 141  135 - 145 mEq/L   Potassium 4.2  3.5 - 5.1 mEq/L   Chloride 103  96 - 112 mEq/L   CO2 30  19 - 32 mEq/L   Glucose, Bld 113 (*) 70 - 99 mg/dL   BUN 25 (*) 6 - 23 mg/dL   Creatinine, Ser 7.82 (*) 0.50 - 1.35 mg/dL   Calcium 9.0  8.4 - 95.6 mg/dL   GFR calc non Af Amer 20 (*) >90 mL/min   GFR calc Af Amer 23 (*) >90 mL/min   Comment:            The eGFR has been calculated     using the CKD EPI equation.     This calculation has not been     validated in all clinical     situations.     eGFR's persistently     <90 mL/min signify     possible Chronic Kidney Disease.  URINALYSIS, ROUTINE W REFLEX MICROSCOPIC     Status: Abnormal   Collection Time    10/15/12  5:46 AM      Result Value Range   Color, Urine YELLOW  YELLOW   APPearance HAZY (*) CLEAR   Specific Gravity, Urine 1.016  1.005 - 1.030   pH 5.0  5.0 - 8.0    Glucose, UA NEGATIVE  NEGATIVE mg/dL   Hgb urine dipstick TRACE (*) NEGATIVE   Bilirubin Urine NEGATIVE  NEGATIVE   Ketones, ur NEGATIVE  NEGATIVE mg/dL   Protein, ur NEGATIVE  NEGATIVE mg/dL   Urobilinogen, UA 0.2  0.0 - 1.0 mg/dL   Nitrite NEGATIVE  NEGATIVE   Leukocytes, UA NEGATIVE  NEGATIVE  URINE MICROSCOPIC-ADD ON     Status: Abnormal   Collection Time    10/15/12  5:46 AM      Result Value Range   Squamous Epithelial / LPF FEW (*) RARE   WBC, UA 0-2  <3 WBC/hpf   RBC / HPF 0-2  <3 RBC/hpf   Bacteria, UA FEW (*) RARE   Casts HYALINE CASTS (*) NEGATIVE    Ct Head  Wo Contrast  10/14/2012  *RADIOLOGY REPORT*  Clinical Data: 79 year old male fall weakness pain posterior head injury.  CT HEAD WITHOUT CONTRAST  Technique:  Contiguous axial images were obtained from the base of the skull through the vertex without contrast.  Comparison: 09/22/2012 and earlier.  Findings: No scalp hematoma identified.  No acute orbit soft tissue findings. Visualized paranasal sinuses and mastoids are clear.  No acute osseous abnormality identified.  Calcified atherosclerosis at the skull base.  No ventriculomegaly. No midline shift, mass effect, or evidence of mass lesion.  Stable gray-white matter differentiation throughout the brain.  No evidence of cortically based acute infarction identified.  No suspicious intracranial vascular hyperdensity. No acute intracranial hemorrhage identified.  IMPRESSION: Stable and negative for age.  No acute traumatic injury identified.   Original Report Authenticated By: Erskine Speed, M.D.    Ct Lumbar Spine Wo Contrast  10/14/2012  *RADIOLOGY REPORT*  Clinical Data: 79 year old male status post fall with weakness. Low back pain.  Recent pneumonia.  CT LUMBAR SPINE WITHOUT CONTRAST  Technique:  Multidetector CT imaging of the lumbar spine was performed without intravenous contrast administration. Multiplanar CT image reconstructions were also generated.  Comparison: [facility] lumbar radiographs 03/30/2010. CT abdomen and pelvis 06/05/2009.  Findings: Chronic infrarenal abdominal aortic aneurysm partially visible today, measuring at least 34 mm diameter (34 mm in 2010). Extensive calcified atherosclerosis of the aorta and iliac arteries again noted.  No retroperitoneal stranding.  Other visualized abdominal viscera within normal limits for age. Patchy opacity at both lung bases greater on the left, may be atelectasis.  Osteopenia.  Normal lumbar segmentation.  Stable lumbar vertebral height and alignment with chronic straightening of lordosis and mild retrolisthesis of L5 on S1.  Diffuse lumbar disc degeneration with vacuum disc phenomena, circumferential disc and osteophyte complex throughout the lumbar spine.  Superimposed moderate to severe lumbar facet hypertrophy at all levels.  Visible sacrum and SI joints intact.  Visible lower thoracic levels appear stable and intact.  Multifactorial spinal stenosis occurs at the following levels: - L2-L3, moderate spinal stenosis and right greater than left lateral recess stenosis, - L3-L4, mild to moderate spinal stenosis and lateral recess stenosis greater on the left, - L4-L5, mild to moderate spinal stenosis with similar bilateral lateral recess stenosis.  IMPRESSION: 1. No acute osseous abnormality in the lumbar spine. 2.  Chronic infrarenal abdominal aortic aneurysm, measuring at least 34 mm in diameter, and cannot determine stability since 2010 based on this exam. Consider follow-up ultrasound or CTA of the abdominal aorta to evaluate stability. 3.  Chronic diffuse lumbar disc, endplate, and facet degeneration. Multilevel lumbar spinal and lateral recess stenosis, most pronounced at L2-L3. 4.  Pulmonary opacity and costophrenic angles, may reflect atelectasis.   Original Report Authenticated By: Erskine Speed, M.D.    Dg Chest Portable 1 View  10/14/2012  *RADIOLOGY REPORT*  Clinical Data: 79 year old male weakness,  near-syncope.  PORTABLE CHEST - 1 VIEW  Comparison: 09/27/2012 and earlier.  Findings: AP portable semi upright view 1800 hours.  More lordotic view.  Stable cardiac size and mediastinal contours.  Left chest cardiac pacemaker.  No pneumothorax.  No pleural effusion.  Mild residual patchy opacity in the right upper lobe and left lung base, improved since 09/24/2012.  No areas of worsening ventilation identified.  IMPRESSION: Mild residual patchy opacity in the right upper lobe and left lung base, but decreased since 09/24/2012.   Original Report Authenticated By: Erskine Speed, M.D.  Review of Systems  Constitutional: Negative for fever and diaphoresis.  Respiratory: Positive for shortness of breath (With exertion). Negative for cough.   Cardiovascular: Negative for chest pain, palpitations, orthopnea and leg swelling.  Gastrointestinal: Positive for nausea and vomiting. Negative for abdominal pain.  Neurological: Negative for dizziness.   Blood pressure 83/57, pulse 133, temperature 97.8 F (36.6 C), temperature source Oral, resp. rate 20, height 5\' 6"  (1.676 m), weight 75.66 kg (166 lb 12.8 oz), SpO2 100.00%. Physical Exam  Constitutional: He is oriented to person, place, and time. He appears well-developed and well-nourished. No distress.  HENT:  Head: Normocephalic and atraumatic.  Eyes: EOM are normal. Pupils are equal, round, and reactive to light. No scleral icterus.  Neck: Normal range of motion. Neck supple.  Cardiovascular: Normal rate, regular rhythm, S1 normal and S2 normal.   Murmur heard.  Systolic murmur is present with a grade of 1/6  Pulses:      Radial pulses are 2+ on the right side, and 2+ on the left side.       Dorsalis pedis pulses are 2+ on the right side, and 1+ on the left side.  No Carotid bruit  Respiratory: He has no wheezes. He has no rales.  Decreased BS bilaterally.  No rales  GI: Soft. Bowel sounds are normal. He exhibits no distension. There is no  tenderness.  Musculoskeletal: He exhibits no edema.  Lymphadenopathy:    He has no cervical adenopathy.  Neurological: He is alert and oriented to person, place, and time. He exhibits normal muscle tone.  Skin: Skin is warm and dry.    Assessment/Plan: 1. Presyncope 2. Acute on chronic kidney disease 3. Atrial fib. 4.  History of second degree block.  SP PPM 5. Dehydration  Plan:  EF is 55-60% based on recent echo.  Likely diastolic CHF and was over diuresed.  Agree with IV fluids.  HR is control.  Pacing on tele.  May only need PRN lasix after discharge.  No acute injury on head CT.  Pacer interrogation today.  BP better this AM.   HAGER, BRYAN 10/15/2012, 9:29 AM

## 2012-10-15 NOTE — Progress Notes (Signed)
[facility] PROGRESS NOTE [name] L. Link Snuffer, MD               Pager [phone] (if 7P to 7A, page night hospitalist on [email or web address] [name] YNW:[id number] DOB: 06/03/1922 DOA: 10/14/2012 PCP: [name] Herter, MD  Assessment/Plan: 1. Pre-syncope:  - While does have elevated BNP, clinically, he appears dehydrated, so the culprit is likely to be postural hypotension, due to volume depletion in the setting of poor oral intake, continuing diuretic and anti-hypertensive therapy.  - NS at 125 cc/hr -  hold diuretics today . Pt on RA so no signs of heart failure  - TTE in Feb showed normal EF   - Trop neg x 2   - EP team at [facility] is aware (spoke w/ [name] [name] today) and they will interrogate pacer tomorrow   - EKG showed paced complexes  - will contact [facility] given he is a patient of theirs [name] [name] et al, will be consulted.   - CT head was unremarkable   - Tele  2. Dehydration/Renal failure (ARF), acute on chronic: Patient has known CKD-3, with baseline creatinine of 1.4-1.6. At presentation, creatinine is 2.30, and patient looked clinically dehydrated. Will manage as described in #1 above, with iv fluids, avoid nephrotoxins and hold diuretics. Following renal indices w/ daily BMP 3. 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  - consulted EP and [facility]  - HR this AM is 70s. Will cont his metop 25mg  PO BID  4. Malignancy: History of colon cancer 1999 s/p surgery/colostomy, XRT/chemotherapy and history of non-Hodgkin lymphoma 2002. Patient follows up with [name] [name], and appears stable.  5. Low back pain: This is chronic, and due to DJD, but per patient, has become really troublesome lately. Will evaluate with Lumbar CT scan and utilize analgesics.  6. Moderate AS : His 2D echocardiogram of 09/24/12, revealed mild to moderate aortic stenosis, Valve area: 1.47cm^2(VTI), EF  55%-60%.  - will hydrate and contact [facility]     [name] Penna, MD  Internal Medicine Pager [phone].  If 7PM-7AM, please contact night-coverage at [email or web address], password [facility] 10/15/2012, 8:20 AM   LOS: 1 day   Brief narrative: This is an 79 year old male,with known history of Depression, dyslipidemia, CKD-3, baseline creatinine of 1.4-1.6, AAA s/p repair, s/p bilateral cataract surgery, deafness, HTN, atrial fibrillation/2nd degree AV block, s/p PPM 09/2011, on chronic anticoagulation with Apixiban, lumbar DJD, history of colon cancer 1999 s/p surgery/colostomy, XRT & Chemotherapy, history of non-Hodgkin lymphoma 2002. He is s/p hospitalization from 2/7/1-2/14/14 for bilateral pneumonia.   Presented from SNF for weakness and fall w/o LOC.   Consultants:  [place] [facility]  [facility]  Procedures:  none  Antibiotics:  none ?  HPI/Subjective: No complaints this AM. Does endorse loss of appetite w/ poor hydration recently. Paced in 70s this AM.    Objective: Filed Vitals:   10/14/12 2243 10/15/12 0543 10/15/12 0544 10/15/12 0546  BP:  129/63 102/55 83/57  Pulse: 70 69 70 133  Temp: 97.5 F (36.4 C) 97.8 F (36.6 C)    TempSrc: Oral Oral    Resp: 18 20    Height: 5\' 6"  (1.676 m)     Weight: 75.252 kg (165 lb 14.4 oz) 75.66 kg (166 lb 12.8 oz)    SpO2: 100% 100%      Intake/Output Summary (Last 24 hours) at 10/15/12 0820 Last data filed at 10/15/12 0700  Gross per 24 hour  Intake  762.5 ml  Output    100 ml  Net  662.5 ml    Exam:  General: Patient does not appear to be in obvious acute distress. Alert, communicative, fully oriented, talking in complete sentences, not short of breath at rest.  HEENT: No clinical pallor, no jaundice, no conjunctival injection or discharge. Dry MM.  NECK: Supple, JVP not seen, no carotid bruits, no palpable lymphadenopathy, no palpable goiter.  CHEST: No wheezes, crackles at bases  HEART: Sounds 1 and 2 heard, normal, regular, no murmurs.   ABDOMEN: Moderately obese, soft, non-tender. Colostomy flange/bag is noted in the RLQ and appears unremarkable. No palpable organomegaly, no palpable masses, normal bowel sounds.  GENITALIA: Not examined.  LOWER EXTREMITIES: No pitting edema, palpable peripheral pulses.  MUSCULOSKELETAL SYSTEM: Generalized osteoarthritic changes, otherwise, normal.  CENTRAL NERVOUS SYSTEM: No focal neurologic deficit on gross examination. Skin: tenting present  Data Reviewed: Basic Metabolic Panel:  Recent Labs Lab 10/10/12 1643 10/14/12 1739 10/14/12 1806 10/15/12 0450  NA 140 138 140 141  K 4.6 4.3 4.2 4.2  CL 103 99 103 103  CO2 27 26  --  30  GLUCOSE 152* 170* 166* 113*  BUN 29* 22 24* 25*  CREATININE 2.13* 2.37* 2.30* 2.67*  CALCIUM 9.0 9.3  --  9.0  MG 1.6  --   --   --    Liver Function Tests:  Recent Labs Lab 10/14/12 1739  AST 26  ALT 19  ALKPHOS 92  BILITOT 0.4  PROT 6.7  ALBUMIN 2.9*   No results found for this basename: LIPASE, AMYLASE,  in the last 168 hours No results found for this basename: AMMONIA,  in the last 168 hours CBC:  Recent Labs Lab 10/10/12 1643 10/14/12 1739 10/14/12 1806 10/15/12 0450  WBC 10.3 6.7  --  7.9  NEUTROABS 8.2* 5.4  --   --   HGB 12.6* 13.1 13.3 12.3*  HCT 36.8* 38.7* 39.0 36.5*  MCV 87.0 88.4  --  89.2  PLT 159 140*  --  131*   Cardiac Enzymes:  Recent Labs Lab 10/14/12 2022 10/15/12 0155  TROPONINI <0.30 <0.30   BNP: No components found with this basename: POCBNP,  CBG: No results found for this basename: GLUCAP,  in the last 168 hours  No results found for this or any previous visit (from the past 240 hour(s)).   Studies: Dg Chest 2 View  09/27/2012  *RADIOLOGY REPORT*  Clinical Data: Weakness, pacemaker, fall wrist, pneumonia  CHEST - 2 VIEW  Comparison: 09/24/2012  Findings: Left subclavian sequential transvenous pacemaker leads project at right atrium and right ventricle. Enlargement of cardiac silhouette.  Calcified tortuous aorta. Pulmonary vascularity grossly normal. Improved pulmonary infiltrates. Tiny right pleural effusion. No pneumothorax.  IMPRESSION: Improved pulmonary infiltrates.   Original Report Authenticated By: Ulyses Southward, M.D.    Dg Chest 2 View  09/22/2012  *RADIOLOGY REPORT*  Clinical Data: Shortness of breath, chest pain.  CHEST - 2 VIEW  Comparison: July 09, 2012.  Findings: Stable cardiomegaly.  No change in position of left-sided pacemaker.  Interval development of a large right upper lobe opacity as well as smaller left perihilar opacity concerning for pneumonia or possibly edema.  IMPRESSION: Interval development of bilateral upper lobe opacities with right worse than left, concerning for pneumonia or possibly edema.   Original Report Authenticated By: Lupita Raider.,  M.D.    Ct Head Wo Contrast  10/14/2012  *RADIOLOGY REPORT*  Clinical Data: 79 year old male fall weakness pain posterior head injury.  CT HEAD WITHOUT CONTRAST  Technique:  Contiguous axial images were obtained from the base of the skull through the vertex without contrast.  Comparison: 09/22/2012 and earlier.  Findings: No scalp hematoma identified.  No acute orbit soft tissue findings. Visualized paranasal sinuses and mastoids are clear.  No acute osseous abnormality identified.  Calcified atherosclerosis at the skull base.  No ventriculomegaly. No midline shift, mass effect, or evidence of mass lesion.  Stable gray-white matter differentiation throughout the brain.  No evidence of cortically based acute infarction identified.  No suspicious intracranial vascular hyperdensity. No acute intracranial hemorrhage identified.  IMPRESSION: Stable and negative for age.  No acute traumatic injury identified.   Original Report Authenticated By: Erskine Speed, M.D.    Ct Head Wo Contrast  09/22/2012  *RADIOLOGY REPORT*  Clinical Data: Weakness  CT HEAD WITHOUT CONTRAST  Technique:  Contiguous axial images were obtained from the  base of the skull through the vertex without contrast.  Comparison: CT head 07/15/2008.  MRI 08/11/2011  Findings: Age appropriate atrophy.  Negative for acute infarct. Negative for hemorrhage or mass.  No fluid collection or shift of the midline structures.  Calvarium shows no acute abnormality. Atherosclerotic calcification in the vertebral and carotid arteries bilaterally.  IMPRESSION: No acute intracranial abnormality.   Original Report Authenticated By: Janeece Riggers, M.D.    Ct Lumbar Spine Wo Contrast  10/14/2012  *RADIOLOGY REPORT*  Clinical Data: 79 year old male status post fall with weakness. Low back pain.  Recent pneumonia.  CT LUMBAR SPINE WITHOUT CONTRAST  Technique:  Multidetector CT imaging of the lumbar spine was performed without intravenous contrast administration. Multiplanar CT image reconstructions were also generated.  Comparison: [facility] lumbar radiographs 03/30/2010. CT abdomen and pelvis 06/05/2009.  Findings: Chronic infrarenal abdominal aortic aneurysm partially visible today, measuring at least 34 mm diameter (34 mm in 2010). Extensive calcified atherosclerosis of the aorta and iliac arteries again noted.  No retroperitoneal stranding.  Other visualized abdominal viscera within normal limits for age. Patchy opacity at both lung bases greater on the left, may be atelectasis.  Osteopenia.  Normal lumbar segmentation.  Stable lumbar vertebral height and alignment with chronic straightening of lordosis and mild retrolisthesis of L5 on S1.  Diffuse lumbar disc degeneration with vacuum disc phenomena, circumferential disc and osteophyte complex throughout the lumbar spine.  Superimposed moderate to severe lumbar facet hypertrophy at all levels.  Visible sacrum and SI joints intact.  Visible lower thoracic levels appear stable and intact.  Multifactorial spinal stenosis occurs at the following levels: - L2-L3, moderate spinal stenosis and right greater than left lateral  recess stenosis, - L3-L4, mild to moderate spinal stenosis and lateral recess stenosis greater on the left, - L4-L5, mild to moderate spinal stenosis with similar bilateral lateral recess stenosis.  IMPRESSION: 1. No acute osseous abnormality in the lumbar spine. 2.  Chronic infrarenal abdominal aortic aneurysm, measuring at least 34 mm in diameter, and cannot determine stability since 2010 based on this exam. Consider follow-up ultrasound or CTA of the abdominal aorta to evaluate stability. 3.  Chronic diffuse lumbar disc, endplate, and facet degeneration. Multilevel lumbar spinal and lateral recess stenosis, most pronounced at L2-L3. 4.  Pulmonary opacity and costophrenic angles, may reflect atelectasis.   Original Report Authenticated By: Erskine Speed, M.D.    Dg Chest Portable 1 View  10/14/2012  *RADIOLOGY REPORT*  Clinical Data: 79 year old male weakness, near-syncope.  PORTABLE  CHEST - 1 VIEW  Comparison: 09/27/2012 and earlier.  Findings: AP portable semi upright view 1800 hours.  More lordotic view.  Stable cardiac size and mediastinal contours.  Left chest cardiac pacemaker.  No pneumothorax.  No pleural effusion.  Mild residual patchy opacity in the right upper lobe and left lung base, improved since 09/24/2012.  No areas of worsening ventilation identified.  IMPRESSION: Mild residual patchy opacity in the right upper lobe and left lung base, but decreased since 09/24/2012.   Original Report Authenticated By: Erskine Speed, M.D.    Dg Chest Port 1 View  09/24/2012  *RADIOLOGY REPORT*  Clinical Data: Evaluate pulmonary congestion  PORTABLE CHEST - 1 VIEW  Comparison: Prior chest x-ray 09/23/2012  Findings: Similar to incrementally improved bilateral interstitial and airspace opacities with increased confluence in the right upper and mid lung.  Stable cardiomegaly with probable left atrial enlargement.  Left subclavian dual lead cardiac rhythm maintenance device in unchanged position.  No pneumothorax.   Possible trace bilateral effusions are stable.  IMPRESSION:  Similar to incrementally improved bilateral interstitial and airspace opacities which may reflect asymmetric edema versus multi focal pneumonia.   Original Report Authenticated By: Malachy Moan, M.D.    Dg Chest Port 1 View  09/23/2012  *RADIOLOGY REPORT*  Clinical Data: 79 year old male shortness of breath and congestion.  PORTABLE CHEST - 1 VIEW  Comparison: 09/22/2012 and earlier.  Findings: Semi upright AP portable view 1115 hours.  More generalized increased vague and nodular pulmonary opacity, greater in the right lung.  The same time, the right greater than left perihilar opacity yesterday is less confluent.  Stable cardiac size and mediastinal contours.  Left chest cardiac pacemaker. Visualized tracheal air column is within normal limits.  IMPRESSION: Less confluent but more generalized bilateral pulmonary opacity. Differential considerations remain bilateral infection and asymmetric edema.   Original Report Authenticated By: Erskine Speed, M.D.     Scheduled Meds: . amiodarone  400 mg Oral Daily  . apixaban  2.5 mg Oral BID  . digoxin  0.125 mg Oral Daily  . escitalopram  20 mg Oral Daily  . metoprolol tartrate  25 mg Oral BID  . niacin  500 mg Oral QHS  . pantoprazole  40 mg Oral Q1200  . sodium chloride  3 mL Intravenous Q12H  . sodium chloride  3 mL Intravenous Q12H  . tamsulosin  0.4 mg Oral Daily   Continuous Infusions: . sodium chloride 75 mL/hr at 10/15/12 0002     Time Spent: 45 mnutes

## 2012-10-15 NOTE — Consult Note (Signed)
Pt. Seen and examined. Agree with the NP/PA-C note as written.  Well known 79 yo male with PAF, diastolic HF, second degree AVB s/p PPM.  He presents with a pre-syncopal episode and orthostatic hypotension as described above. This has responded to fluids. I suspect he is overdiuresed - there are no signs of volume overload. Hold lasix and give IV fluids for now - follow orthostatics until they normalize. Pacer evaluation today per [facility] who follows his device. Rx for lasix prn for weight gain over 3 lbs in 3 days at home.  Thanks for consulting [place]. Will sign-off. Follow-up with MLP in our office in 5-7 days after discharge.  Chrystie Nose, MD, [facility] Attending Cardiologist [facility]

## 2012-10-15 NOTE — Progress Notes (Signed)
Pt's orthostatic done this am BP while lying is 129/63; sitting is 102/55 and 83/57 while standing, pt unable to tolerate one more standing blood pressure and was complaining of dizziness, put pt back on bed. Elray Mcgregor NP was notified.

## 2012-10-16 LAB — CBC
Hemoglobin: 10.6 g/dL — ABNORMAL LOW (ref 13.0–17.0)
MCV: 89.4 fL (ref 78.0–100.0)
Platelets: 127 10*3/uL — ABNORMAL LOW (ref 150–400)
RBC: 3.57 MIL/uL — ABNORMAL LOW (ref 4.22–5.81)
WBC: 5.8 10*3/uL (ref 4.0–10.5)

## 2012-10-16 LAB — BASIC METABOLIC PANEL
CO2: 25 mEq/L (ref 19–32)
Calcium: 8.3 mg/dL — ABNORMAL LOW (ref 8.4–10.5)
Chloride: 108 mEq/L (ref 96–112)
Potassium: 3.7 mEq/L (ref 3.5–5.1)
Sodium: 142 mEq/L (ref 135–145)

## 2012-10-16 MED ORDER — GLUCERNA SHAKE PO LIQD
237.0000 mL | Freq: Two times a day (BID) | ORAL | Status: DC
  Administered 2012-10-16 – 2012-10-17 (×2): 237 mL via ORAL

## 2012-10-16 NOTE — Progress Notes (Signed)
Utilization Review Completed.   Kimberly Tucker, RN, BSN Nurse Case Manager  [phone]  

## 2012-10-16 NOTE — Progress Notes (Signed)
Patient ID: [name] [name], male   DOB: 1921-09-18, 79 y.o.   MRN: [id number]  [facility] PROGRESS NOTE  ARGYLE GUSTAFSON ZHY:[id number] DOB: 03-28-1922 DOA: 10/14/2012 PCP: Carollee Herter, MD  Brief narrative: Pt is 79 year old male,with known history of Depression, dyslipidemia, CKD-3, baseline creatinine of 1.4-1.6, AAA s/p repair, s/p bilateral cataract surgery, deafness, HTN, atrial fibrillation/2nd degree AV block, s/p PPM 09/2011, on chronic anticoagulation with Apixiban, lumbar DJD, history of colon cancer 1999 s/p surgery/colostomy, XRT & Chemotherapy, history of non-Hodgkin lymphoma 2002. He is s/p hospitalization from 2/7/1-2/14/14 for bilateral pneumonia.  Presented from SNF for weakness and fall w/o LOC.    1. Pre-syncope:  - clinically dehydrated on admission, culprit likely postural hypotension, due to volume depletion in the setting of poor oral intake, continuing diuretic and anti-hypertensive therap - pt tolerating PO intake well, will plan on discontinuing IVF today - TTE in Feb showed normal EF  - Trop neg x 2  - EKG showed paced complexes  - appreciate cardio input  - CT head unremarkable  2. Dehydration/Renal failure (ARF), acute on chronic:  - Patient has known CKD-3, with baseline creatinine of 1.4-1.6.  - creatinine continues to trend down - will plan on discontinuing IVF and repeat BMP in AM 3. 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  - consulted EP and [facility], appreciate input  - Will cont his metop 25mg  PO BID  4. Malignancy:  - History of colon cancer 1999 s/p surgery/colostomy, XRT/chemotherapy and history of non-Hodgkin lymphoma 2002.  - Patient follows up with Dr Darnelle Catalan, and appears stable.  5. Low back pain:  - This is chronic, and due to DJD, but per patient, has become really troublesome lately. Continue analgesia as  needed 6. Moderate AS :  - 2D echocardiogram of 09/24/12, revealed mild to moderate aortic stenosis,  EF 55%-60%.  7. Anemia of chronic disease: - Hg and Hct remain stable and at pt's baseline but slightly down from yesterday  - CBC in AM  Consultants:   EP  [facility]  Procedures/Studies: Ct Head Wo Contrast 10/14/2012  Stable and negative for age.  No acute traumatic injury identified.    Ct Lumbar Spine Wo Contrast 10/14/2012   1.  No acute osseous abnormality in the lumbar spine.  2.  Chronic infrarenal AAA, measuring 34 mm in diameter, and cannot determine stability since 2010. Consider follow-up US or CTA of the abdominal aorta to evaluate stability.  3.  Chronic diffuse lumbar disc, endplate, and facet degeneration. Multilevel lumbar spinal and lateral recess stenosis, most pronounced at L2-L3.  4.  Pulmonary opacity and costophrenic angles, may reflect atelectasis.     Dg Chest Portable 1 View 10/14/2012   Mild residual patchy opacity in the right upper lobe and left lung base, but decreased since 09/24/2012.     Antibiotics:  none  HPI/Subjective: No events overnight.   Objective: Filed Vitals:   10/15/12 2053 10/16/12 0513 10/16/12 1034 10/16/12 1512  BP: 120/65 124/62 117/65 115/48  Pulse: 70 71 70 69  Temp: 97.3 F (36.3 C) 97.6 F (36.4 C)  97.5 F (36.4 C)  TempSrc: Oral Oral  Oral  Resp: 18 18  19   Height:      Weight:  171 lb 4.8 oz (77.701 kg)    SpO2: 100% 100%  100%    Intake/Output Summary (Last 24 hours) at 10/16/12  1518 Last data filed at 10/16/12 1500  Gross per 24 hour  Intake   3885 ml  Output    750 ml  Net   3135 ml    Exam:   General:  Pt is alert, follows commands appropriately, not in acute distress  Cardiovascular: Paced rhythm, SEM 2/6, no rubs, no gallops  Respiratory: Clear to auscultation bilaterally, no wheezing, no crackles, no rhonchi  Abdomen: Soft, non tender, non distended, bowel sounds present, no  guarding  Extremities: No edema, pulses DP and PT palpable bilaterally  Neuro: Grossly nonfocal  Data Reviewed: Basic Metabolic Panel:  Recent Labs Lab 10/10/12 1643 10/14/12 1739 10/14/12 1806 10/15/12 0450 10/15/12 0910 10/16/12 0140  NA 140 138 140 141 139 142  K 4.6 4.3 4.2 4.2 3.9 3.7  CL 103 99 103 103 102 108  CO2 27 26  --  30 26 25   GLUCOSE 152* 170* 166* 113* 204* 116*  BUN 29* 22 24* 25* 24* 21  CREATININE 2.13* 2.37* 2.30* 2.67* 2.46* 2.08*  CALCIUM 9.0 9.3  --  9.0 9.2 8.3*  MG 1.6  --   --   --   --   --    Liver Function Tests:  Recent Labs Lab 10/14/12 1739  AST 26  ALT 19  ALKPHOS 92  BILITOT 0.4  PROT 6.7  ALBUMIN 2.9*   CBC:  Recent Labs Lab 10/10/12 1643 10/14/12 1739 10/14/12 1806 10/15/12 0450 10/15/12 0910 10/16/12 0140  WBC 10.3 6.7  --  7.9 6.8 5.8  NEUTROABS 8.2* 5.4  --   --   --   --   HGB 12.6* 13.1 13.3 12.3* 12.5* 10.6*  HCT 36.8* 38.7* 39.0 36.5* 37.1* 31.9*  MCV 87.0 88.4  --  89.2 88.3 89.4  PLT 159 140*  --  131* 123* 127*   Cardiac Enzymes:  Recent Labs Lab 10/14/12 2022 10/15/12 0155 10/15/12 0910  TROPONINI <0.30 <0.30 <0.30   Scheduled Meds: . amiodarone  400 mg Oral Daily  . apixaban  2.5 mg Oral BID  . digoxin  0.125 mg Oral Daily  . escitalopram  20 mg Oral Daily  . metoprolol tartrate  25 mg Oral BID  . niacin  500 mg Oral QHS  . pantoprazole  40 mg Oral Q1200  . tamsulosin  0.4 mg Oral Daily   Continuous Infusions: . sodium chloride 125 mL/hr at 10/16/12 0758     Debbora Presto, MD  [facility] Pager [phone]  If 7PM-7AM, please contact night-coverage [email or web address] Password [email or web address] 10/16/2012, 3:18 PM   LOS: 2 days

## 2012-10-16 NOTE — Progress Notes (Signed)
Physical Therapy Treatment Patient Details Name: KYREESE CHIO MRN: [id number] DOB: Feb 21, 1922 Today's Date: 10/16/2012 Time: 4098-1191 PT Time Calculation (min): 26 min  PT Assessment / Plan / Recommendation Comments on Treatment Session  Adm. for pre-syncope. Progressing with mobility today. Ambulated 150 ft.     Follow Up Recommendations  Home health PT     Does the patient have the potential to tolerate intense rehabilitation     Barriers to Discharge        Equipment Recommendations  None recommended by PT    Recommendations for Other Services OT consult  Frequency Min 3X/week   Plan Discharge plan remains appropriate;Frequency remains appropriate    Precautions / Restrictions Precautions Precautions: Fall Restrictions Weight Bearing Restrictions: No   Pertinent Vitals/Pain Sitting in chair SaO2 100% on 3 liters prior to ambulation; following ambulation pt sitting in chair doing exercises on RA and it remained at or around 94-96%, replaced 2 liters O2 following session and nurse tech aware    Mobility  Bed Mobility Bed Mobility: Not assessed Transfers Transfers: Sit to Stand;Stand to Sit Sit to Stand: 5: Supervision;With upper extremity assist;From chair/3-in-1 Stand to Sit: 5: Supervision;With upper extremity assist;To chair/3-in-1 Details for Transfer Assistance: cues for safe hand placement with regards to RW (Pt uses rollator with brakes at home) Ambulation/Gait Ambulation/Gait Assistance: 4: Min guard Ambulation Distance (Feet): 150 Feet Assistive device: Rolling walker Ambulation/Gait Assistance Details: cues for tall posture and gaurding for safety especially with turns, ambulated on 3 liters O2 Gait Pattern: Trunk flexed;Shuffle Gait velocity: decreased    Exercises General Exercises - Lower Extremity Long Arc Quad: AROM;Both;20 reps;Seated Hip Flexion/Marching: AROM;Both;20 reps;Seated Toe Raises: AROM;Both;Other reps (comment);Seated (30  reps) Heel Raises: AROM;Both;Other reps (comment);Seated (30 reps)     PT Goals Acute Rehab PT Goals PT Goal: Sit to Stand - Progress: Progressing toward goal PT Goal: Stand to Sit - Progress: Progressing toward goal PT Goal: Ambulate - Progress: Progressing toward goal  Visit Information  Last PT Received On: 10/16/12 Assistance Needed: +1    Subjective Data  Subjective: I'll try to walk. Patient Stated Goal: home   Cognition  Cognition Overall Cognitive Status: Appears within functional limits for tasks assessed/performed Arousal/Alertness: Awake/alert Orientation Level: Appears intact for tasks assessed Behavior During Session: [facility] for tasks performed    Balance     End of Session PT - End of Session Equipment Utilized During Treatment: Gait belt Activity Tolerance: Patient tolerated treatment well Patient left: in chair;with call bell/phone within reach Nurse Communication: Mobility status   GP     [facility] HELEN 10/16/2012, 3:18 PM

## 2012-10-16 NOTE — Progress Notes (Signed)
INITIAL NUTRITION ASSESSMENT  DOCUMENTATION CODES Per approved criteria  -Not Applicable   INTERVENTION: 1. Glucerna Shake po BID, each supplement provides 220 kcal and 10 grams of protein.  2. Encourage PO supplements after d/c 3. RD will continue to follow     NUTRITION DIAGNOSIS: Inadequate oral intake related to poor appetite as evidenced by weight loss.   Goal: PO intake to meet >/=90% estimated nutrition needs  Monitor:  Po intake, weight trends, I/O's, labs   Reason for Assessment: Malnutrition Screening Tool  79 y.o. male  Admitting Dx: syncopal episode   ASSESSMENT: Pt admitted after fall at home. Per notes pt with hypotension likely related to poor oral intake.  Pt reports unintentional weight loss and poor appetite PTA per malnutrition screening tool.   Pt states that he has lost 20 lb in the past month. Weight hx shows pt weight loss is only 11 lb in several months, not significant. He has limited appetite and early satiety. States he is eating <25% of his normal meals.   Pt is agreeable to nutrition shakes while here. RD also encouraged this after d/c to supplement poor meal intake.   Noted: pt with some elevated blood sugars, do not see DM dx in PMH. A1c 6.7, question new onset DM?   Height: Ht Readings from Last 1 Encounters:  10/14/12 5\' 6"  (1.676 m)    Weight: Wt Readings from Last 1 Encounters:  10/16/12 171 lb 4.8 oz (77.701 kg)    Ideal Body Weight: 142 lbs   % Ideal Body Weight: 120%  Wt Readings from Last 10 Encounters:  10/16/12 171 lb 4.8 oz (77.701 kg)  10/10/12 176 lb (79.833 kg)  09/29/12 170 lb 11.2 oz (77.429 kg)  07/10/12 182 lb 5.1 oz (82.7 kg)  07/04/12 184 lb (83.462 kg)  06/07/12 182 lb 8 oz (82.781 kg)  06/01/12 182 lb (82.555 kg)  05/29/12 186 lb (84.369 kg)  03/27/12 181 lb (82.101 kg)  03/14/12 170 lb (77.111 kg)    Usual Body Weight: 182 lbs   % Usual Body Weight: 94%  BMI:  Body mass index is 27.66 kg/(m^2).  Overweight   Estimated Nutritional Needs: Kcal: 1700-1900 Protein: 60-70 gm  Fluid: 1.7-1.9 L/day  Skin: intact   Diet Order: Cardiac  EDUCATION NEEDS: -No education needs identified at this time   Intake/Output Summary (Last 24 hours) at 10/16/12 1111 Last data filed at 10/16/12 0600  Gross per 24 hour  Intake   3000 ml  Output    750 ml  Net   2250 ml    Last BM: PTA    Labs:   Recent Labs Lab 10/10/12 1643  10/15/12 0450 10/15/12 0910 10/16/12 0140  NA 140  < > 141 139 142  K 4.6  < > 4.2 3.9 3.7  CL 103  < > 103 102 108  CO2 27  < > 30 26 25   BUN 29*  < > 25* 24* 21  CREATININE 2.13*  < > 2.67* 2.46* 2.08*  CALCIUM 9.0  < > 9.0 9.2 8.3*  MG 1.6  --   --   --   --   GLUCOSE 152*  < > 113* 204* 116*  < > = values in this interval not displayed.  CBG (last 3)  No results found for this basename: GLUCAP,  in the last 72 hours Lab Results  Component Value Date   HGBA1C 6.7* 09/23/2012     Scheduled Meds: . amiodarone  400 mg Oral Daily  . apixaban  2.5 mg Oral BID  . digoxin  0.125 mg Oral Daily  . escitalopram  20 mg Oral Daily  . metoprolol tartrate  25 mg Oral BID  . niacin  500 mg Oral QHS  . pantoprazole  40 mg Oral Q1200  . sodium chloride  3 mL Intravenous Q12H  . sodium chloride  3 mL Intravenous Q12H  . tamsulosin  0.4 mg Oral Daily    Continuous Infusions: . sodium chloride 125 mL/hr at 10/16/12 2130    Past Medical History  Diagnosis Date  . Depression   . Aneurysm of abdominal aorta     [facility], Dr. Royann Shivers  . History of colon cancer     hospitalization 1999  . Degenerative disc disease, lumbar   . History of non-Hodgkin's lymphoma     hospitalization 2002  . Cataract   . Hard of hearing     wears hearing aids  . Hypertension   . Hyperlipidemia   . Thrombocytopenia     Dr. Darnelle Catalan  . Memory loss   . Hyperglycemia   . Edema   . Heart murmur     Echocardiogram 7/10, stress test 2008, [facility]  . Overactive bladder   .  Anemia     Dr. Darnelle Catalan  . AV block, 2nd degree 02/06/2012  . H/O colostomy 02/06/2012  . History of AAA (abdominal aortic aneurysm) repair 02/06/2012  . CKD (chronic kidney disease) stage 3, GFR 30-59 ml/min 02/06/2012  . Atrial fibrillation 01/2012    cardioversion to NSR  . Pacemaker 02/08/2012    [employer] RF dual-chamber pacemaker. Dr. Hillis Range  . Hospital-acquired pneumonia 09/2012    with respiratory distress    Past Surgical History  Procedure Laterality Date  . Colostomy    . Cardioversion  02/09/2012    Procedure: CARDIOVERSION;  Surgeon: Chrystie Nose, MD;  Location: [facility] OR;  Service: Cardiovascular;  Laterality: N/A;  . Pacemaker placement  02/08/2012    Dr. Hillis Range ([facility])  . Cataract extraction, bilateral      Clarene Duke RD, LDN Pager [phone] After Hours pager [phone]

## 2012-10-17 ENCOUNTER — Telehealth: Payer: Self-pay | Admitting: Internal Medicine

## 2012-10-17 LAB — CBC
HCT: 30.6 % — ABNORMAL LOW (ref 39.0–52.0)
MCV: 89 fL (ref 78.0–100.0)
RBC: 3.44 MIL/uL — ABNORMAL LOW (ref 4.22–5.81)
RDW: 16.2 % — ABNORMAL HIGH (ref 11.5–15.5)
WBC: 5.4 10*3/uL (ref 4.0–10.5)

## 2012-10-17 LAB — BASIC METABOLIC PANEL
CO2: 25 mEq/L (ref 19–32)
Chloride: 108 mEq/L (ref 96–112)
Creatinine, Ser: 1.65 mg/dL — ABNORMAL HIGH (ref 0.50–1.35)
Glucose, Bld: 127 mg/dL — ABNORMAL HIGH (ref 70–99)

## 2012-10-17 NOTE — Progress Notes (Signed)
Physical Therapy Treatment Patient Details Name: [name] [name] MRN: [id number] DOB: 1922/05/04 Today's Date: 10/17/2012 Time: 4098-1191 PT Time Calculation (min): 24 min  PT Assessment / Plan / Recommendation Comments on Treatment Session  Pt admitted for pre-syncope and progressing slowly with mobility. Pt stated he didn't stay long at SNF after DC due to difficulty caring for ostomy pouch but reports he hasn't been doing so well at home either. Pt continues to benefit from encouragement and education to progress activity. Will follow.     Follow Up Recommendations  Home health PT;Supervision for mobility/OOB (pt stating he does not want to try [facility] again)     Does the patient have the potential to tolerate intense rehabilitation     Barriers to Discharge        Equipment Recommendations       Recommendations for Other Services    Frequency     Plan Discharge plan remains appropriate;Frequency remains appropriate    Precautions / Restrictions Precautions Precautions: Fall Precaution Comments: watch sats, ostomy   Pertinent Vitals/Pain 8/10 chronic back pain sats 90-92% on RA with HR 75-81    Mobility  Bed Mobility Bed Mobility: Sit to Supine Sit to Supine: 4: Min assist;HOB flat Details for Bed Mobility Assistance: min assist to fully elevate legs onto surface Transfers Sit to Stand: 5: Supervision;With armrests;From chair/3-in-1 Stand to Sit: 5: Supervision;To bed Details for Transfer Assistance: cueing for hand placement and safety Ambulation/Gait Ambulation/Gait Assistance: 4: Min assist Ambulation Distance (Feet): 85 Feet Assistive device: 1 person hand held assist Ambulation/Gait Assistance Details: pt refused using the RW today stating he has been using a cane at home so he agreed to [facility] but kept reaching out for furniture and railing with other hand despite encouragement for use of RW and reminder that he used RW during last admission. Pt denied increased  distance due to back pain Gait Pattern: Shuffle;Trunk flexed;Narrow base of support Gait velocity: decreased Stairs: No    Exercises General Exercises - Lower Extremity Ankle Circles/Pumps: AROM;Both;20 reps;Supine Long Arc Quad: AROM;Both;20 reps;Seated Hip ABduction/ADduction: AROM;Both;20 reps;Supine Hip Flexion/Marching: AROM;Both;20 reps;Seated   PT Diagnosis:    PT Problem List:   PT Treatment Interventions:     PT Goals Acute Rehab PT Goals PT Goal: Sit to Supine/Side - Progress: Progressing toward goal PT Goal: Sit to Stand - Progress: Progressing toward goal PT Goal: Stand to Sit - Progress: Progressing toward goal PT Goal: Ambulate - Progress: Progressing toward goal  Visit Information  Last PT Received On: 10/17/12 Assistance Needed: +1    Subjective Data  Subjective: I just can't go far because of my back   Cognition  Cognition Overall Cognitive Status: Appears within functional limits for tasks assessed/performed Arousal/Alertness: Awake/alert Orientation Level: Appears intact for tasks assessed Behavior During Session: [facility] for tasks performed    Balance     End of Session PT - End of Session Equipment Utilized During Treatment: Gait belt Activity Tolerance: Patient limited by pain Patient left: in bed;with call bell/phone within reach Nurse Communication: Mobility status   GP     [name] 10/17/2012, 11:22 AM Delaney Meigs, PT [phone]

## 2012-10-17 NOTE — Care Management Note (Unsigned)
    Page 1 of 2   10/17/2012     1:49:56 PM   CARE MANAGEMENT NOTE 10/17/2012  Patient:  [name] [name], [name] [name]   Account Number:  [id number]  Date Initiated:  10/17/2012  Documentation initiated by:  Tera Mater  Subjective/Objective Assessment:   79yo male admitted with Syncope.  Pt. lives at home with spouse in [place].     Action/Plan:   Discharge planning and disposiiton   Anticipated DC Date:  10/17/2012   Anticipated DC Plan:  HOME W HOME HEALTH SERVICES      DC Planning Services  CM consult      [facility] Choice  HOME HEALTH   Choice offered to / List presented to:  C-3 Spouse        HH arranged  HH-2 PT      [facility] agency  [facility]   Status of service:  Completed, signed off Medicare Important Message given?   (If response is "NO", the following Medicare IM given date fields will be blank) Date Medicare IM given:   Date Additional Medicare IM given:    Discharge Disposition:  HOME W HOME HEALTH SERVICES  Per UR Regulation:  Reviewed for med. necessity/level of care/duration of stay  If discussed at Long Length of Stay Meetings, dates discussed:    Comments:  10/17/12 1130 In to speak with pt. about Home health services.  Pt. states his spouse gets therapy and he would like for her to chose which [facility] agency.  TC to [employer], [phone]) to inquire of [facility] agency, and she chose [facility].  TC to [facility] [phone]) to give referral for [facility] PT.  Faxed facesheet, HH order, history and physical, and PT notes to [phone]).  Pt. to dc home today with son and spouse. Tera Mater, RN, BSN NCM [phone]

## 2012-10-17 NOTE — Discharge Summary (Signed)
Physician Discharge Summary  [name] [name]:[id number] DOB: July 09, 1922 DOA: 10/14/2012  PCP: Carollee Herter, MD  Admit date: 10/14/2012 Discharge date: 10/17/2012  Recommendations for Outpatient Follow-up:  1. Pt will need to follow up with PCP in 2-3 weeks post discharge 2. Please obtain BMP to evaluate electrolytes and kidney function 3. Please also check CBC to evaluate Hg and Hct levels  Discharge Diagnoses: Pre - syncope secondary to dehydration  Active Problems:   Pre-syncope   Dehydration   Renal failure (ARF), acute on chronic   Atrial fibrillation   Malignancy   Low back pain  Discharge Condition: Stable  Diet recommendation: Heart healthy diet discussed in details   Brief narrative:  Pt is 79 year old male,with known history of Depression, dyslipidemia, CKD-3, baseline creatinine of 1.4-1.6, AAA s/p repair, s/p bilateral cataract surgery, deafness, HTN, atrial fibrillation/2nd degree AV block, s/p PPM 09/2011, on chronic anticoagulation with Apixiban, lumbar DJD, history of colon cancer 1999 s/p surgery/colostomy, XRT & Chemotherapy, history of non-Hodgkin lymphoma 2002. He is s/p hospitalization from 2/7/1-2/14/14 for bilateral pneumonia.  Presented from SNF for weakness and near fall w/o LOC.   1. Pre-syncope:  - clinically dehydrated on admission, culprit likely postural hypotension, due to volume depletion in the setting of poor oral intake, continuing diuretic and anti-hypertensive therap  - pt tolerating PO intake well, off IVF over 24 hours - TTE in Feb showed normal EF  - Trop neg x 2  - EKG showed paced complexes  - CT head unremarkable  2. Dehydration/Renal failure (ARF), acute on chronic:  - Patient has known CKD-3, with baseline creatinine of 1.4-1.6.  - creatinine continues to trend down  3. 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  -  consulted EP and [facility], appreciate input  - Will cont his metop 25mg  PO BID  4. Malignancy:  - History of colon cancer 1999 s/p surgery/colostomy, XRT/chemotherapy and history of non-Hodgkin lymphoma 2002.  - Patient follows up with Dr Darnelle Catalan, and appears stable.  5. Low back pain:  - This is chronic, and due to DJD, but per patient, has become really troublesome lately. Continue analgesia as needed  6. Moderate AS :  - 2D echocardiogram of 09/24/12, revealed mild to moderate aortic stenosis, EF 55%-60%.  7. Anemia of chronic disease:  - Hg and Hct remain stable and at pt's baseline but slightly down from yesterday   Consultants:  [place] [facility]  [facility] Procedures/Studies:  Ct Head Wo Contrast  10/14/2012  Stable and negative for age. No acute traumatic injury identified.  Ct Lumbar Spine Wo Contrast  10/14/2012  1. No acute osseous abnormality in the lumbar spine.  2. Chronic infrarenal AAA, measuring 34 mm in diameter, and cannot determine stability since 2010. Consider follow-up US or CTA of the abdominal aorta to evaluate stability.  3. Chronic diffuse lumbar disc, endplate, and facet degeneration. Multilevel lumbar spinal and lateral recess stenosis, most pronounced at L2-L3.  4. Pulmonary opacity and costophrenic angles, may reflect atelectasis.  Dg Chest Portable 1 View  10/14/2012  Mild residual patchy opacity in the right upper lobe and left lung base, but decreased since 09/24/2012.  Antibiotics:  none  Discharge Exam: Filed Vitals:   10/17/12 0944  BP: 130/51  Pulse: 70  Temp: 97.3 F (36.3 C)  Resp: 18   Filed Vitals:   10/16/12 1512 10/16/12 2144 10/17/12 0527 10/17/12 8119  BP: 115/48 127/58 126/67 130/51  Pulse: 69 69 69 70  Temp: 97.5 F (36.4 C) 98.5 F (36.9 C) 98.1 F (36.7 C) 97.3 F (36.3 C)  TempSrc: Oral Oral Oral Oral  Resp: 19 18 20 18   Height:      Weight:   173 lb 9.6 oz (78.744 kg)   SpO2: 100% 99% 96% 95%    General: Pt is alert, follows  commands appropriately, not in acute distress Cardiovascular: Regular rate and rhythm, S1/S2 +, no murmurs, no rubs, no gallops Respiratory: Clear to auscultation bilaterally, no wheezing, no crackles, no rhonchi Abdominal: Soft, non tender, non distended, bowel sounds +, no guarding Extremities: no edema, no cyanosis, pulses palpable bilaterally DP and PT Neuro: Grossly nonfocal  Discharge Instructions  Discharge Orders   Future Orders Complete By Expires     Diet - low sodium heart healthy  As directed     Increase activity slowly  As directed         Medication List    TAKE these medications       amiodarone 200 MG tablet  Commonly known as:  PACERONE  Take 2 tablets (400 mg total) by mouth daily.     apixaban 5 MG Tabs tablet  Commonly known as:  ELIQUIS  Take 0.5 tablets (2.5 mg total) by mouth 2 (two) times daily.     digoxin 0.125 MG tablet  Commonly known as:  LANOXIN  Take 1 tablet (0.125 mg total) by mouth daily.     escitalopram 20 MG tablet  Commonly known as:  LEXAPRO  Take 1 tablet (20 mg total) by mouth daily.     furosemide 40 MG tablet  Commonly known as:  LASIX  Take 0.5 tablets (20 mg total) by mouth daily.     metoprolol tartrate 25 MG tablet  Commonly known as:  LOPRESSOR  Take 1 tablet (25 mg total) by mouth 2 (two) times daily.     niacin 500 MG CR tablet  Commonly known as:  NIASPAN  Take 1 tablet (500 mg total) by mouth at bedtime.     pantoprazole 40 MG tablet  Commonly known as:  PROTONIX  Take 1 tablet (40 mg total) by mouth daily at 12 noon.     polyethylene glycol packet  Commonly known as:  MIRALAX / GLYCOLAX  Take 17 g by mouth daily as needed.     potassium chloride SA 20 MEQ tablet  Commonly known as:  K-DUR,KLOR-CON  Take 1 tablet (20 mEq total) by mouth daily.     silodosin 8 MG Caps capsule  Commonly known as:  RAPAFLO  Take 1 capsule (8 mg total) by mouth every morning.           Follow-up Information   Follow  up with Carollee Herter, MD In 2 weeks.   Contact information:   [address] [place] [address] [phone]        The results of significant diagnostics from this hospitalization (including imaging, microbiology, ancillary and laboratory) are listed below for reference.     Microbiology: No results found for this or any previous visit (from the past 240 hour(s)).   Labs: Basic Metabolic Panel:  Recent Labs Lab 10/10/12 1643  10/14/12 1739 10/14/12 1806 10/15/12 0450 10/15/12 0910 10/16/12 0140 10/17/12 0730  NA 140  --  138 140 141 139 142 142  K 4.6  --  4.3 4.2 4.2 3.9 3.7 3.6  CL 103  --  99 103 103 102 108 108  CO2 27  --  26  --  30 26 25 25   GLUCOSE 152*  --  170* 166* 113* 204* 116* 127*  BUN 29*  --  22 24* 25* 24* 21 15  CREATININE 2.13*  < > 2.37* 2.30* 2.67* 2.46* 2.08* 1.65*  CALCIUM 9.0  --  9.3  --  9.0 9.2 8.3* 8.3*  MG 1.6  --   --   --   --   --   --   --   < > = values in this interval not displayed. Liver Function Tests:  Recent Labs Lab 10/14/12 1739  AST 26  ALT 19  ALKPHOS 92  BILITOT 0.4  PROT 6.7  ALBUMIN 2.9*   CBC:  Recent Labs Lab 10/10/12 1643 10/14/12 1739 10/14/12 1806 10/15/12 0450 10/15/12 0910 10/16/12 0140 10/17/12 0730  WBC 10.3 6.7  --  7.9 6.8 5.8 5.4  NEUTROABS 8.2* 5.4  --   --   --   --   --   HGB 12.6* 13.1 13.3 12.3* 12.5* 10.6* 10.2*  HCT 36.8* 38.7* 39.0 36.5* 37.1* 31.9* 30.6*  MCV 87.0 88.4  --  89.2 88.3 89.4 89.0  PLT 159 140*  --  131* 123* 127* 125*   Cardiac Enzymes:  Recent Labs Lab 10/14/12 2022 10/15/12 0155 10/15/12 0910  TROPONINI <0.30 <0.30 <0.30   BNP: BNP (last 3 results)  Recent Labs  09/25/12 0410 09/27/12 0610 10/14/12 1740  PROBNP 8257.0* 5061.0* 5496.0*   SIGNED: Time coordinating discharge: Over 30 minutes  Debbora Presto, MD  [facility] 10/17/2012, 10:28 AM Pager [phone]  If 7PM-7AM, please contact  night-coverage [email or web address] Password [email or web address]

## 2012-10-17 NOTE — ED Provider Notes (Signed)
History     CSN: [id number]  Arrival date & time 10/14/12  1719   First MD Initiated Contact with Patient 10/14/12 1732      Chief Complaint  Patient presents with  . Weakness  . Near Syncope     HPI PCP:  Carollee Herter, MD  Chief Complaint:  Syncopal episode.  HPI:  This is an 79 year old male,with known history of Depression, dyslipidemia, CKD-3, baseline creatinine of 1.4-1.6, AAA s/p repair, s/p bilateral cataract surgery, deafness, HTN, atrial fibrillation/2nd degree AV block, s/p PPM 09/2011, on chronic anticoagulation with Apixiban, lumbar DJD, history of colon cancer 1999 s/p surgery/colostomy, XRT & Chemotherapy, history of non-Hodgkin lymphoma 2002. He is s/p hospitalization from 2/7/1-2/14/14 for bilateral pneumonia, following which he was discharged [facility]. While at [facility], he became progressively weak, and was seen on 10/10/22 at Dr Jac Canavan office, where his Lasix dose was halved, and he was encouraged to drink more fluids. A tick was removed from the back of his left hand on that date. On 10/11/12, he felt much better, but has since become gradually weaker, with poor appetite, but no cough, chest pain, fever or chills. On 10/14/12, he woke up feeling a bit "wobbly" At about 2:00 Pm, he went to the bathroom to shower, sitting on a shower bench. After his shower, he dried off, felt nauseated, had a bout 4-5 dry heaves, but no vomiting. He then got up, felt dizzy and fell. Fortunately, he only hit his head on a rubber trash can. His son was in the house, and saw him fall. There was no loss of consciousness. Son helped patient up, they started to walk back towards the bedroom, and he suddenly became limp. Son caught him before he could fall. He appeared confused but awake, and had no involuntary movements. EMS was called, and by the time patient arrived in the ED, he was asymptomatic.  Past Medical History  Diagnosis Date  . Depression   . Aneurysm of abdominal  aorta     [facility], Dr. Royann Shivers  . History of colon cancer     hospitalization 1999  . Degenerative disc disease, lumbar   . History of non-Hodgkin's lymphoma     hospitalization 2002  . Cataract   . Hard of hearing     wears hearing aids  . Hypertension   . Hyperlipidemia   . Thrombocytopenia     Dr. Darnelle Catalan  . Memory loss   . Hyperglycemia   . Edema   . Heart murmur     Echocardiogram 7/10, stress test 2008, [facility]  . Overactive bladder   . Anemia     Dr. Darnelle Catalan  . AV block, 2nd degree 02/06/2012  . H/O colostomy 02/06/2012  . History of AAA (abdominal aortic aneurysm) repair 02/06/2012  . CKD (chronic kidney disease) stage 3, GFR 30-59 ml/min 02/06/2012  . Atrial fibrillation 01/2012    cardioversion to NSR  . Pacemaker 02/08/2012    [employer] DR RF dual-chamber pacemaker. Dr. Hillis Range  . Hospital-acquired pneumonia 09/2012    with respiratory distress    Past Surgical History  Procedure Laterality Date  . Colostomy    . Cardioversion  02/09/2012    Procedure: CARDIOVERSION;  Surgeon: Chrystie Nose, MD;  Location: [facility] OR;  Service: Cardiovascular;  Laterality: N/A;  . Pacemaker placement  02/08/2012    Dr. Hillis Range ([facility])  . Cataract extraction, bilateral      Family History  Problem Relation Age of Onset  . Other Mother   . Other Father     History  Substance Use Topics  . Smoking status: Former Smoker -- 2.00 packs/day    Types: Cigarettes    Quit date: 08/17/1967  . Smokeless tobacco: Never Used  . Alcohol Use: No      Review of Systems  All other systems reviewed and are negative.    Allergies  Review of patient's allergies indicates no known allergies.  Home Medications  No current outpatient prescriptions on file.  BP 127/58  Pulse 69  Temp(Src) 98.5 F (36.9 C) (Oral)  Resp 18  Ht 5\' 6"  (1.676 m)  Wt 171 lb 4.8 oz (77.701 kg)  BMI 27.66 kg/m2  SpO2 99%  Physical Exam  Nursing note and vitals  reviewed. Constitutional: He is oriented to person, place, and time. He appears well-developed and well-nourished. No distress.  HENT:  Head: Normocephalic and atraumatic.  Eyes: Pupils are equal, round, and reactive to light.  Neck: Normal range of motion.  Cardiovascular: Normal rate and intact distal pulses.   Pulmonary/Chest: No respiratory distress.  Abdominal: Normal appearance. He exhibits no distension and no mass. There is no rebound and no guarding.  Musculoskeletal: Normal range of motion.  Neurological: He is alert and oriented to person, place, and time. No cranial nerve deficit. GCS eye subscore is 4. GCS verbal subscore is 5. GCS motor subscore is 6.  Appears weak  Skin: Skin is warm and dry. No rash noted.  Psychiatric: He has a normal mood and affect. His behavior is normal.    ED Course  Procedures (including critical care time)  Labs Reviewed  COMPREHENSIVE METABOLIC PANEL - Abnormal; Notable for the following:    Glucose, Bld 170 (*)    Creatinine, Ser 2.37 (*)    Albumin 2.9 (*)    GFR calc non Af Amer 23 (*)    GFR calc Af Amer 27 (*)    All other components within normal limits  CBC WITH DIFFERENTIAL - Abnormal; Notable for the following:    HCT 38.7 (*)    RDW 15.8 (*)    Platelets 140 (*)    Neutrophils Relative 80 (*)    Lymphocytes Relative 10 (*)    Lymphs Abs 0.6 (*)    All other components within normal limits  URINALYSIS, ROUTINE W REFLEX MICROSCOPIC - Abnormal; Notable for the following:    APPearance HAZY (*)    Hgb urine dipstick TRACE (*)    All other components within normal limits  PRO B NATRIURETIC PEPTIDE - Abnormal; Notable for the following:    Pro B Natriuretic peptide (BNP) 5496.0 (*)    All other components within normal limits  PROTIME-INR - Abnormal; Notable for the following:    Prothrombin Time 16.3 (*)    All other components within normal limits  CBC - Abnormal; Notable for the following:    RBC 4.09 (*)    Hemoglobin  12.3 (*)    HCT 36.5 (*)    RDW 16.2 (*)    Platelets 131 (*)    All other components within normal limits  BASIC METABOLIC PANEL - Abnormal; Notable for the following:    Glucose, Bld 113 (*)    BUN 25 (*)    Creatinine, Ser 2.67 (*)    GFR calc non Af Amer 20 (*)    GFR calc Af Amer 23 (*)    All other components within normal limits  URINE MICROSCOPIC-ADD ON - Abnormal; Notable for the following:    Squamous Epithelial / LPF FEW (*)    Bacteria, UA FEW (*)    Casts HYALINE CASTS (*)    All other components within normal limits  BASIC METABOLIC PANEL - Abnormal; Notable for the following:    Glucose, Bld 204 (*)    BUN 24 (*)    Creatinine, Ser 2.46 (*)    GFR calc non Af Amer 22 (*)    GFR calc Af Amer 25 (*)    All other components within normal limits  CBC - Abnormal; Notable for the following:    RBC 4.20 (*)    Hemoglobin 12.5 (*)    HCT 37.1 (*)    RDW 16.1 (*)    Platelets 123 (*)    All other components within normal limits  BASIC METABOLIC PANEL - Abnormal; Notable for the following:    Glucose, Bld 116 (*)    Creatinine, Ser 2.08 (*)    Calcium 8.3 (*)    GFR calc non Af Amer 27 (*)    GFR calc Af Amer 31 (*)    All other components within normal limits  CBC - Abnormal; Notable for the following:    RBC 3.57 (*)    Hemoglobin 10.6 (*)    HCT 31.9 (*)    RDW 16.2 (*)    Platelets 127 (*)    All other components within normal limits  POCT I-STAT, CHEM 8 - Abnormal; Notable for the following:    BUN 24 (*)    Creatinine, Ser 2.30 (*)    Glucose, Bld 166 (*)    Calcium, Ion 1.09 (*)    All other components within normal limits  TROPONIN I  TROPONIN I  TROPONIN I  BASIC METABOLIC PANEL  CBC  POCT I-STAT TROPONIN I   No results found.  Date: 10/17/2012  Rate: 70  Rhythm: atrial fib  QRS Axis: normal  Intervals: normal  ST/T Wave abnormalities: normal  Conduction Disutrbances:IVCD  Narrative Interpretation: abnormal ECG      1. Pre-syncope    2. Atrial fibrillation   3. Chronic anticoagulation   4. Chronic back pain   5. AV block, 2nd degree   6. CKD (chronic kidney disease) stage 3, GFR 30-59 ml/min   7. Dehydration   8. Hypertension   9. Pacemaker   10. AAA (abdominal aortic aneurysm)   11. Acute diastolic heart failure   12. Adjustment disorder with depressed mood       MDM          Nelia Shi, MD 10/17/12 [phone]

## 2012-10-17 NOTE — Progress Notes (Signed)
Patient's IV and telemetry has been discontinued; patient and son verbalizes understanding of discharge instructions, patient is being discharged home with son.  Lorretta Harp RN

## 2012-10-18 NOTE — Telephone Encounter (Signed)
Patient is currently in the hospital and per Crosby Oyster PA-C they will take care of the potassium and lasix. CLS

## 2012-10-26 ENCOUNTER — Telehealth: Payer: Self-pay | Admitting: Family Medicine

## 2012-10-26 NOTE — Telephone Encounter (Signed)
I called April Todd [phone] [facility] she states she never got a call from [place].  She only got a request from [facility] liason on 3/5 later she told me 3/4.  She then rcd a call on 3/6 that the patient had passed away.  April said we made no call and no fax.  April said only thing documented is hospital liason request.  I called Roshando at [facility] [phone] and she states she got a call from [place] on 2/28 although our records indicate 2/26.  Roshonda forwarded to case manager on 10/13/12.   Meanwhile, April Todd was calling me back.  April states that she got our referral on Monday 3/3 and no contact was ever made with the patient.  She got notice on  3/6 and was advised on pt passing away.  I advised her that our PA was very concerned that the pt was never contacted.  I will get in touch with [facility].

## 2012-10-27 NOTE — Telephone Encounter (Signed)
Thank you for looking in to this.

## 2014-02-28 NOTE — Telephone Encounter (Signed)
Close Encounter 

## 2014-07-11 IMAGING — CT CT HEAD W/O CM
1 series · 16 of 30 positions shown, 20 images · non-contrast
Comparison: CT head 07/15/2008.  MRI 08/11/2011

CLINICAL DATA: Weakness

CT HEAD WITHOUT CONTRAST
TECHNIQUE: Contiguous axial images were obtained from the base of
the skull through the vertex without contrast.

[Series 2: head routine 4.8 h37s · axial · 0.43mm/px · z∈[-167,-12]mm · 16 of 36 slices shown, 20 images]
[im 2/36  brain]
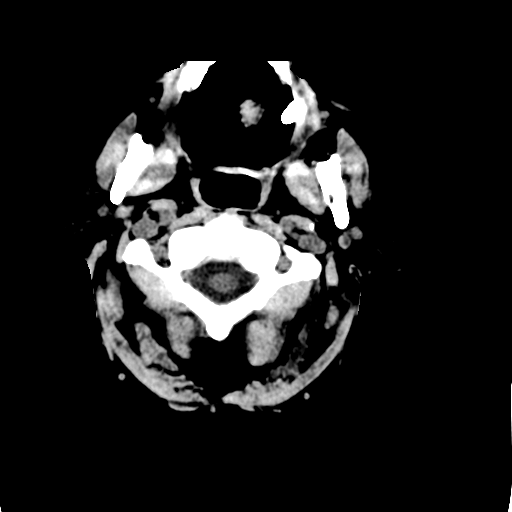
[im 2/36  bone]
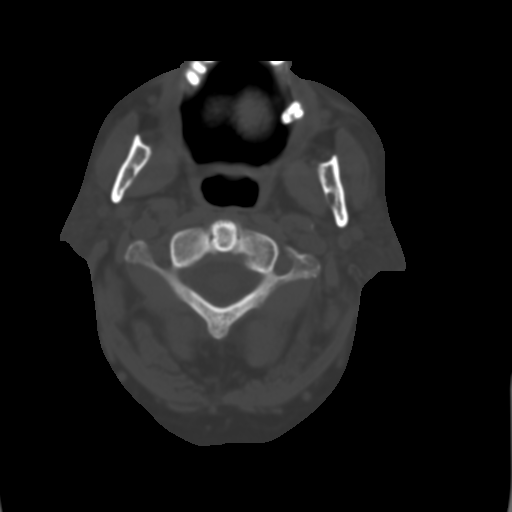
[im 4/36  brain]
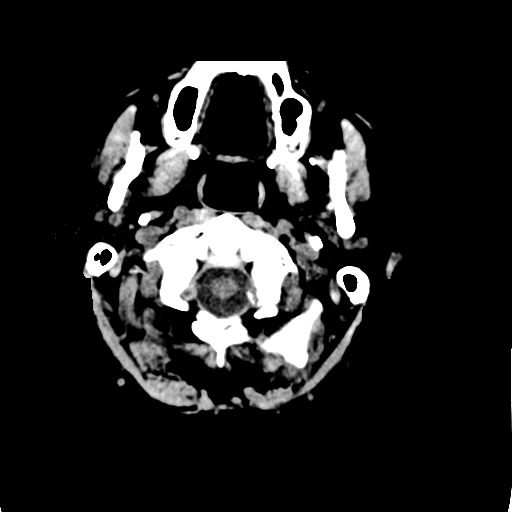
[im 7/36  brain]
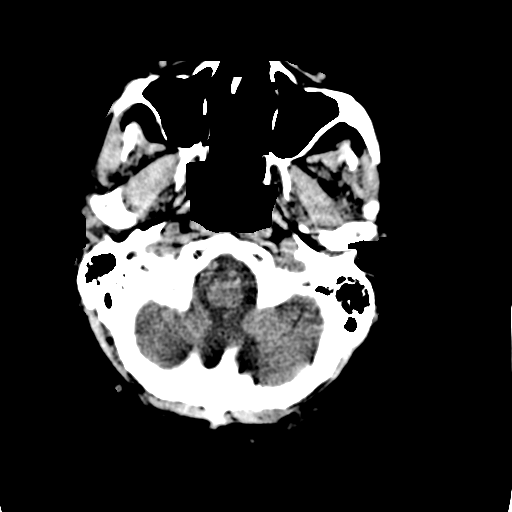
[im 9/36  brain]
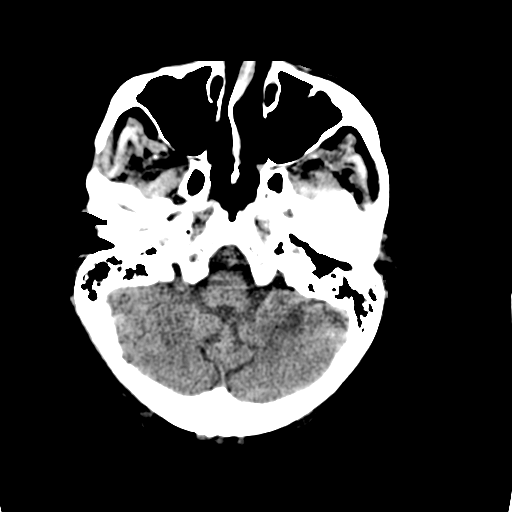
[im 10/36  brain]
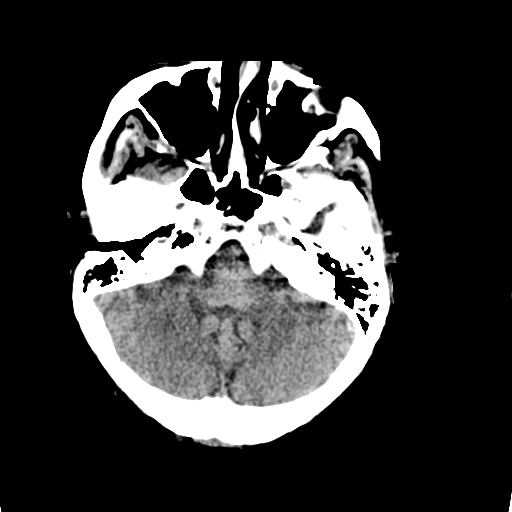
[im 10/36  bone]
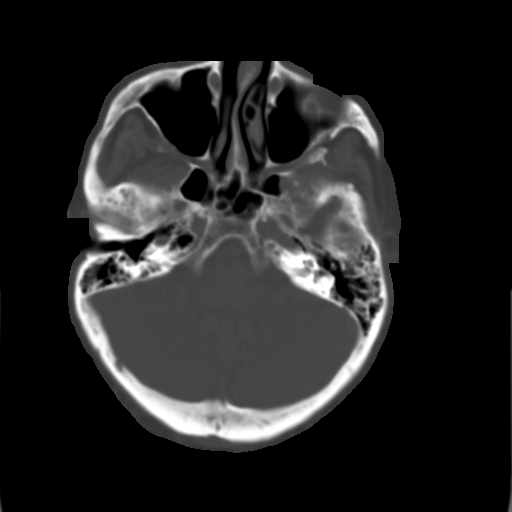
[im 13/36  brain]
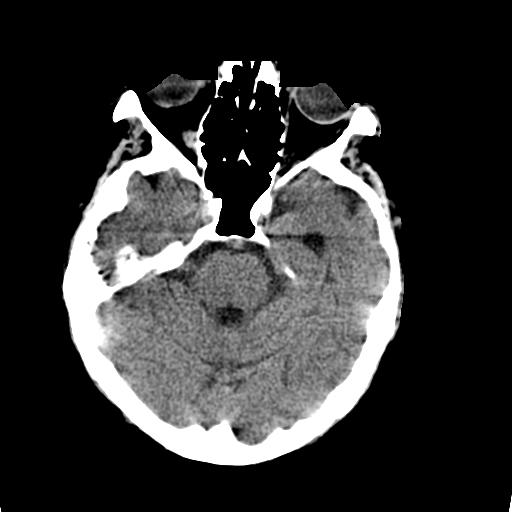
[im 15/36  brain]
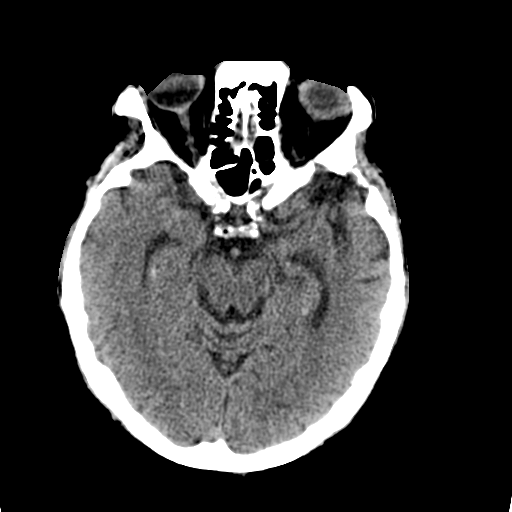
[im 17/36  brain]
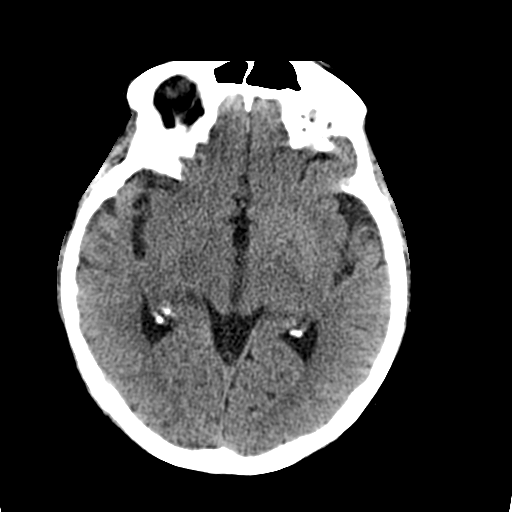
[im 19/36  brain]
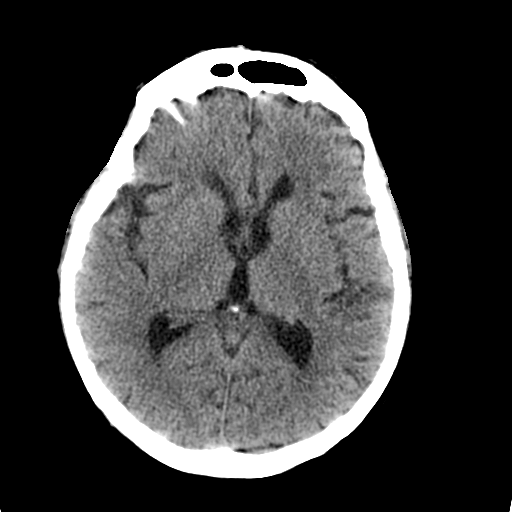
[im 19/36  bone]
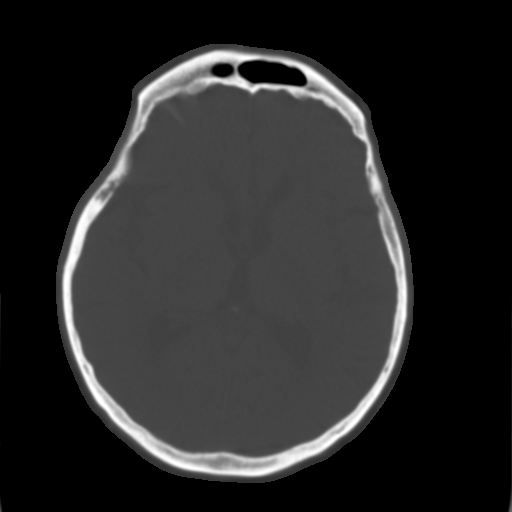
[im 21/36  brain]
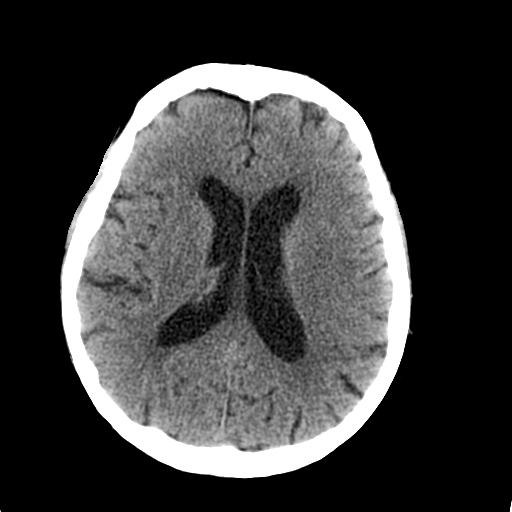
[im 23/36  brain]
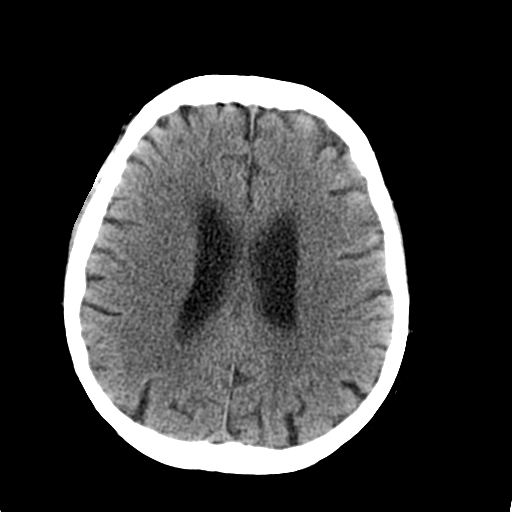
[im 26/36  brain]
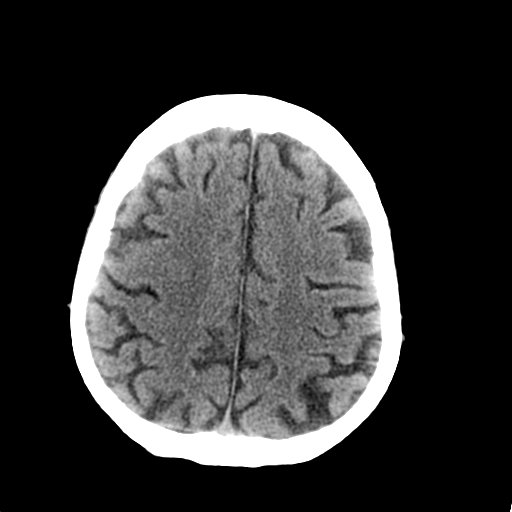
[im 27/36  brain]
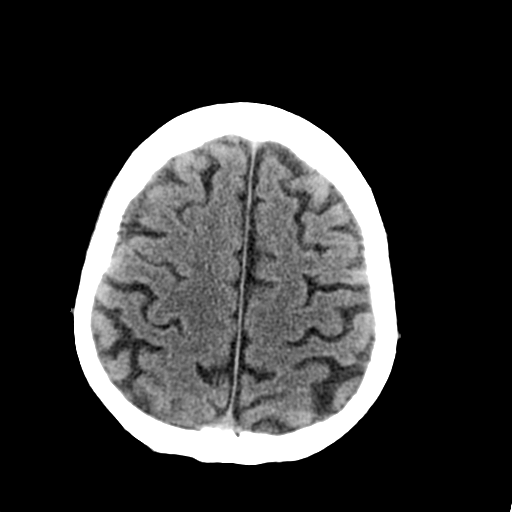
[im 27/36  bone]
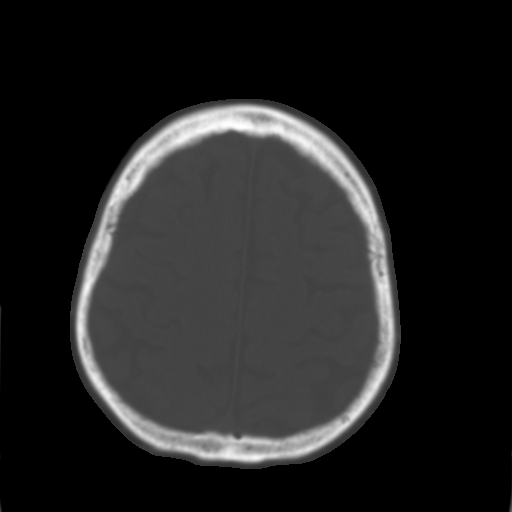
[im 29/36  brain]
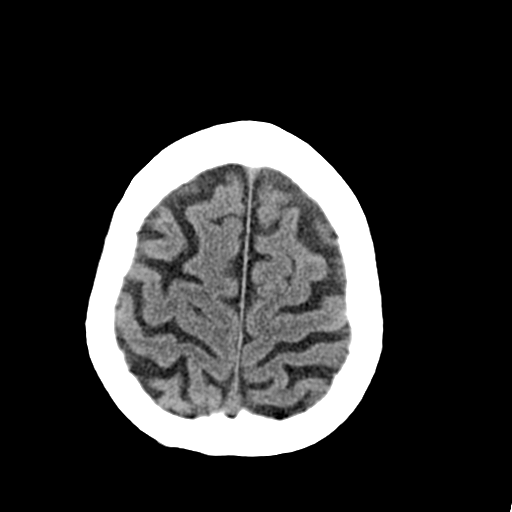
[im 32/36  brain]
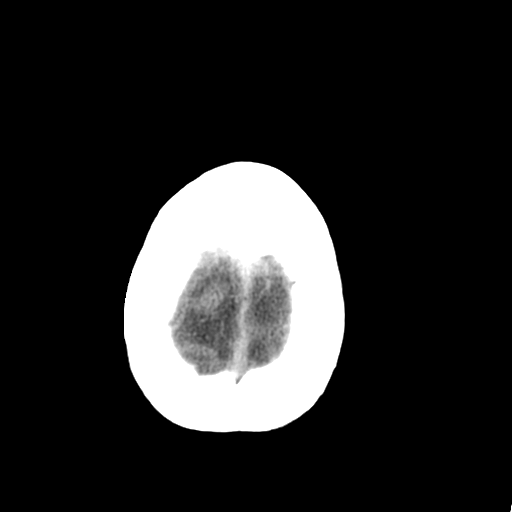
[im 34/36  brain]
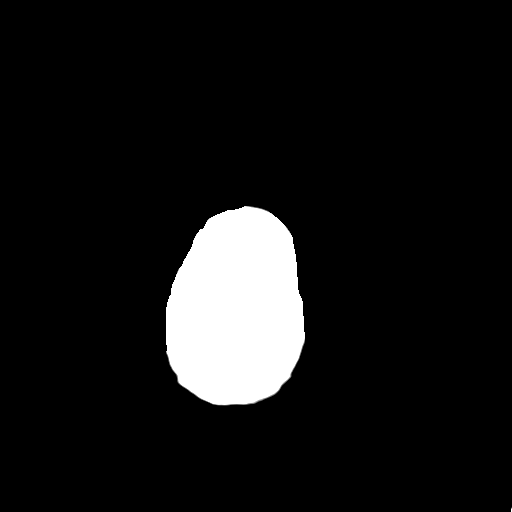

[16 of 30 positions shown; findings below may reference images not displayed]

FINDINGS: Age appropriate atrophy.  Negative for acute infarct.
Negative for hemorrhage or mass.  No fluid collection or shift of
the midline structures.  Calvarium shows no acute abnormality.
Atherosclerotic calcification in the vertebral and carotid arteries
bilaterally.
IMPRESSION: No acute intracranial abnormality.

## 2014-07-16 IMAGING — CR DG CHEST 2V
3 series · 3 of 3 positions shown · non-contrast
Comparison: 09/24/2012

CLINICAL DATA: Weakness, pacemaker, fall wrist, pneumonia

CHEST - 2 VIEW

[w chest pa]
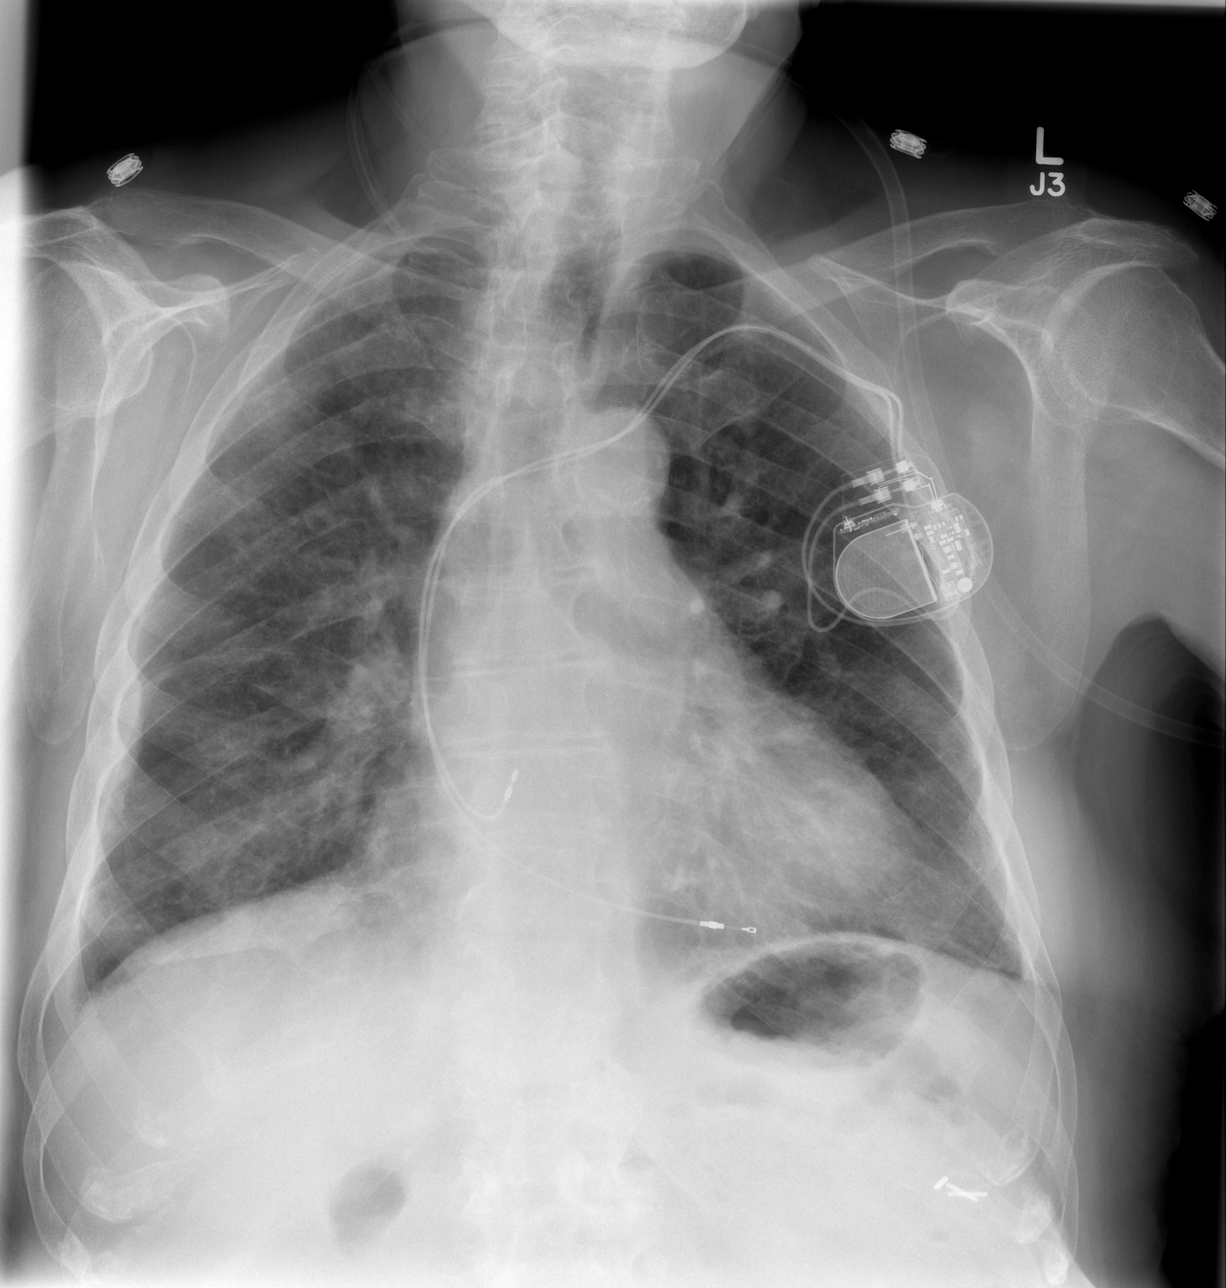

[w chest lat (1 of 2)]
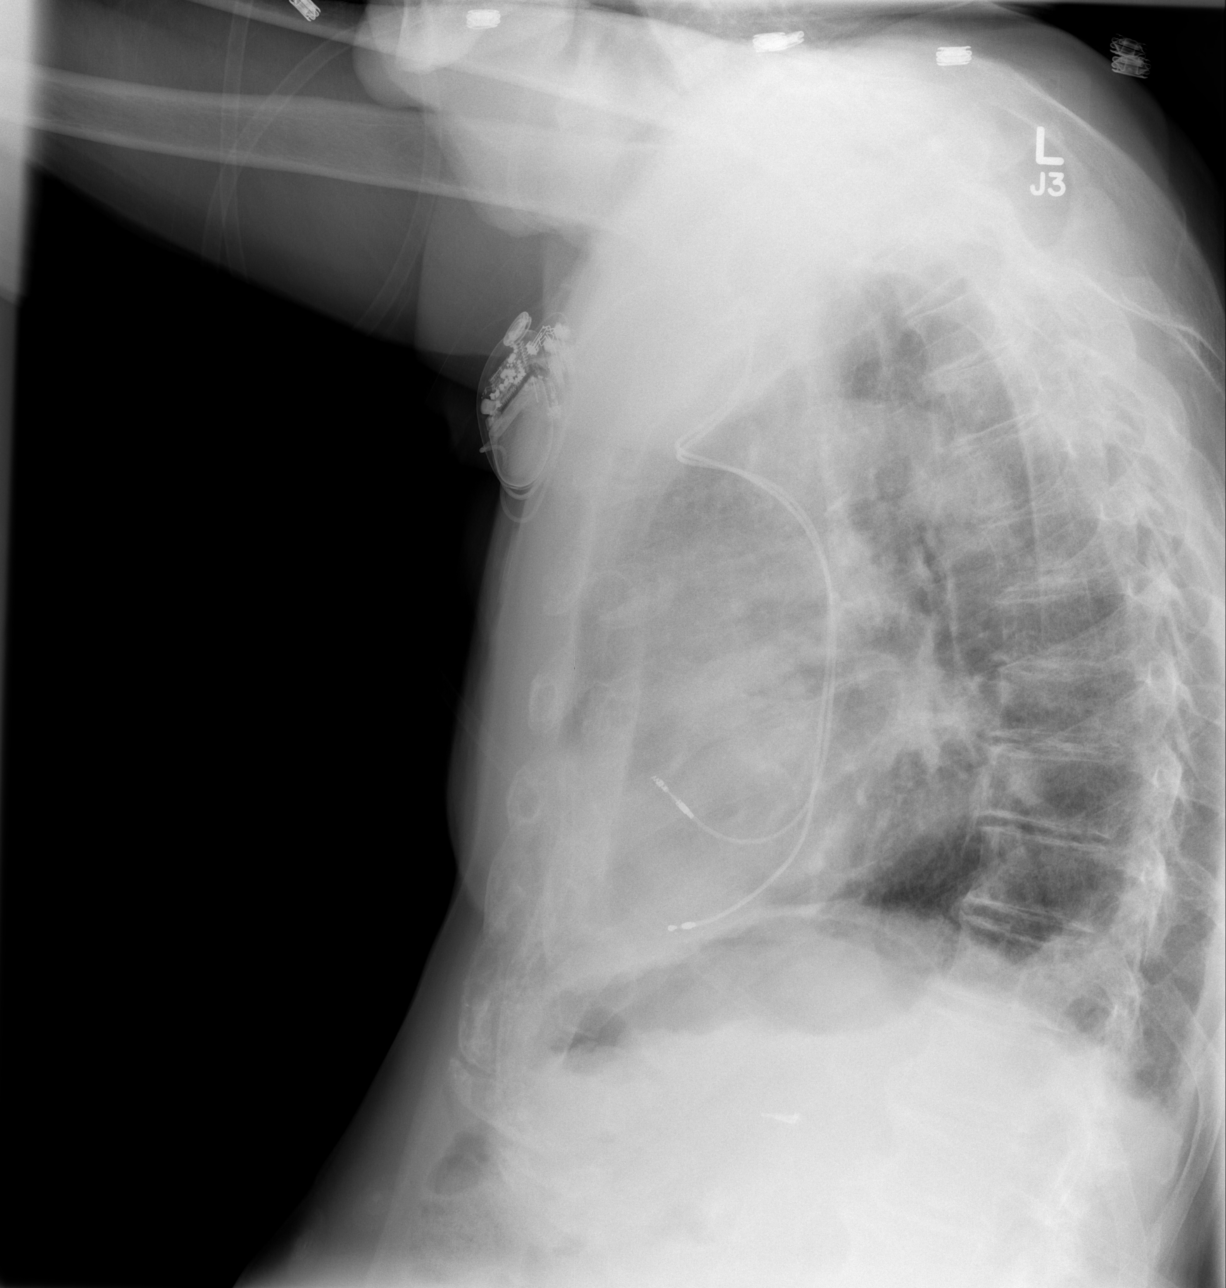

[w chest lat (2 of 2)]
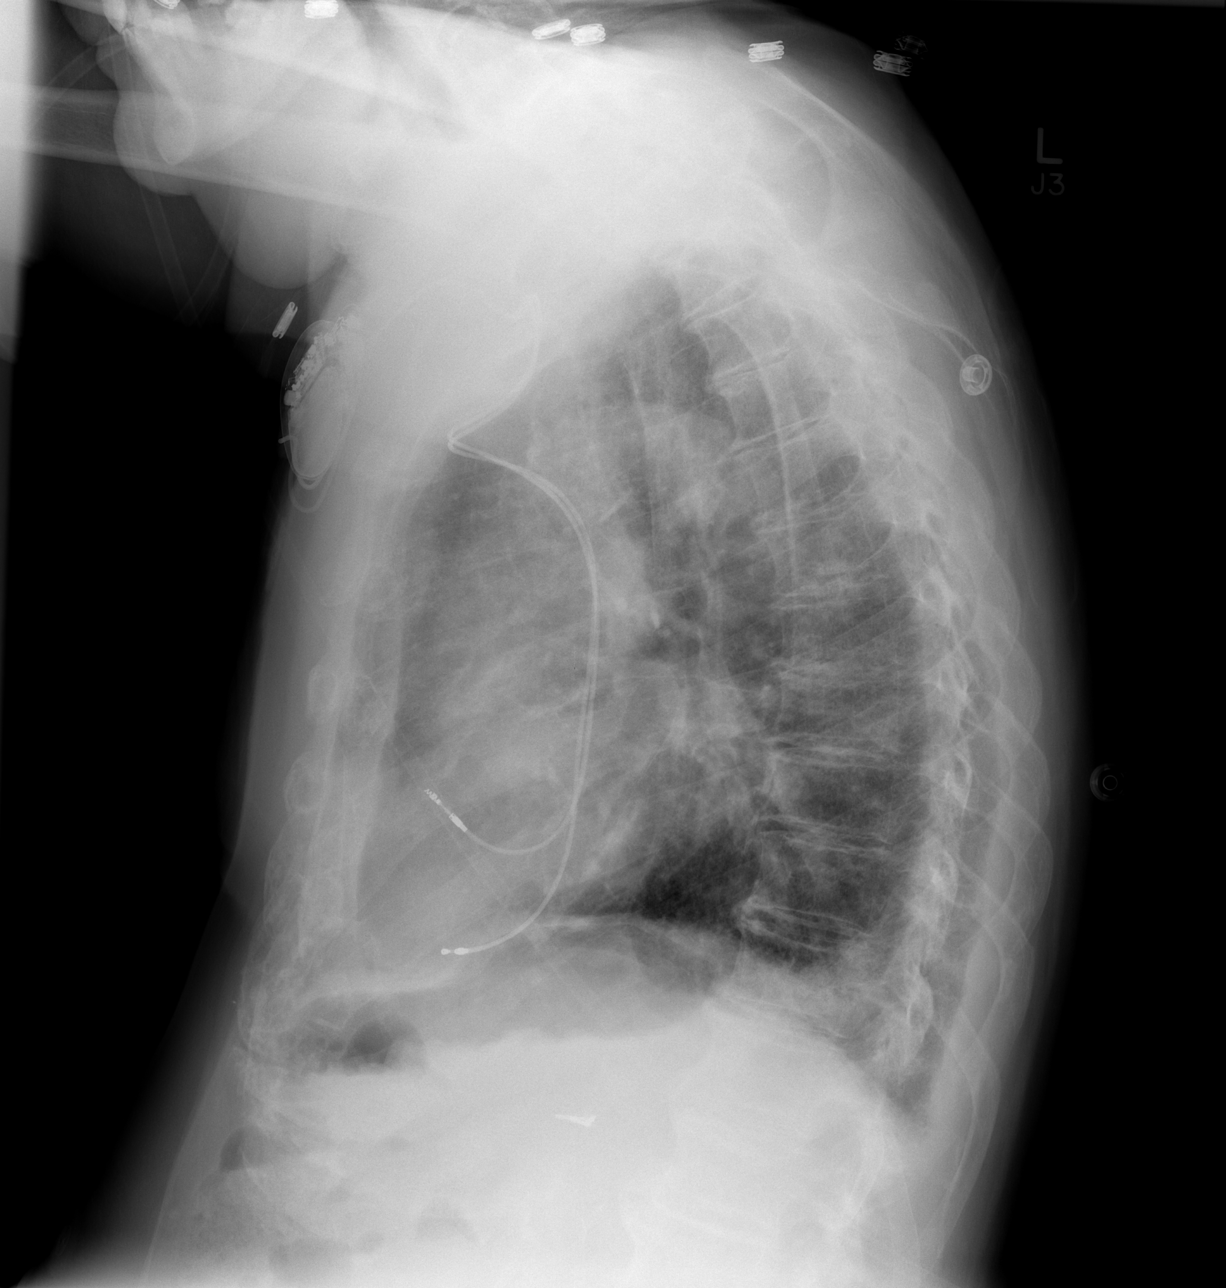

[3 of 3 positions shown; findings below may reference images not displayed]

FINDINGS: Left subclavian sequential transvenous pacemaker leads project at
right atrium and right ventricle.
Enlargement of cardiac silhouette.
Calcified tortuous aorta.
Pulmonary vascularity grossly normal.
Improved pulmonary infiltrates.
Tiny right pleural effusion.
No pneumothorax.
IMPRESSION: Improved pulmonary infiltrates.

## 2014-07-25 ENCOUNTER — Encounter (HOSPITAL_COMMUNITY): Payer: Self-pay | Admitting: Internal Medicine

## 2014-08-29 ENCOUNTER — Encounter (HOSPITAL_COMMUNITY): Payer: Self-pay | Admitting: Cardiovascular Disease

## 2015-02-27 ENCOUNTER — Encounter: Payer: Self-pay | Admitting: *Deleted

## 2015-03-07 ENCOUNTER — Encounter: Payer: Self-pay | Admitting: Internal Medicine

## 2015-03-07 ENCOUNTER — Encounter: Payer: Self-pay | Admitting: Cardiovascular Disease
